# Patient Record
Sex: Female | Born: 1960
Health system: Southern US, Community
[De-identification: ages and names within clinical notes are randomized; demographics above are authoritative.]

## PROBLEM LIST (undated history)

## (undated) DIAGNOSIS — C50919 Malignant neoplasm of unspecified site of unspecified female breast: Secondary | ICD-10-CM

## (undated) DIAGNOSIS — I10 Essential (primary) hypertension: Secondary | ICD-10-CM

## (undated) DIAGNOSIS — D352 Benign neoplasm of pituitary gland: Secondary | ICD-10-CM

## (undated) DIAGNOSIS — T8859XA Other complications of anesthesia, initial encounter: Secondary | ICD-10-CM

## (undated) DIAGNOSIS — G4730 Sleep apnea, unspecified: Secondary | ICD-10-CM

## (undated) DIAGNOSIS — Z923 Personal history of irradiation: Secondary | ICD-10-CM

## (undated) DIAGNOSIS — E229 Hyperfunction of pituitary gland, unspecified: Secondary | ICD-10-CM

## (undated) DIAGNOSIS — O24419 Gestational diabetes mellitus in pregnancy, unspecified control: Secondary | ICD-10-CM

## (undated) DIAGNOSIS — T4145XA Adverse effect of unspecified anesthetic, initial encounter: Secondary | ICD-10-CM

## (undated) DIAGNOSIS — M1990 Unspecified osteoarthritis, unspecified site: Secondary | ICD-10-CM

## (undated) DIAGNOSIS — L409 Psoriasis, unspecified: Secondary | ICD-10-CM

## (undated) DIAGNOSIS — M5020 Other cervical disc displacement, unspecified cervical region: Secondary | ICD-10-CM

## (undated) HISTORY — PX: BREAST SURGERY: SHX581

## (undated) HISTORY — DX: Essential (primary) hypertension: I10

## (undated) HISTORY — PX: MINI-LAPAROTOMY W/ TUBAL LIGATION: SUR811

## (undated) HISTORY — PX: BREAST EXCISIONAL BIOPSY: SUR124

## (undated) HISTORY — PX: ABDOMINAL HYSTERECTOMY: SHX81

## (undated) HISTORY — PX: DILATION AND CURETTAGE OF UTERUS: SHX78

## (undated) HISTORY — PX: APPENDECTOMY: SHX54

## (undated) HISTORY — PX: TUBAL LIGATION: SHX77

## (undated) HISTORY — DX: Malignant neoplasm of unspecified site of unspecified female breast: C50.919

## (undated) HISTORY — PX: HERNIA REPAIR: SHX51

## (undated) HISTORY — PX: FOOT SURGERY: SHX648

## (undated) HISTORY — DX: Hyperfunction of pituitary gland, unspecified: E22.9

## (undated) HISTORY — DX: Benign neoplasm of pituitary gland: D35.2

---

## 1998-03-11 ENCOUNTER — Other Ambulatory Visit: Admission: RE | Admit: 1998-03-11 | Discharge: 1998-03-11 | Payer: Self-pay | Admitting: Obstetrics & Gynecology

## 1999-08-26 ENCOUNTER — Other Ambulatory Visit: Admission: RE | Admit: 1999-08-26 | Discharge: 1999-08-26 | Payer: Self-pay

## 2000-02-08 ENCOUNTER — Other Ambulatory Visit: Admission: RE | Admit: 2000-02-08 | Discharge: 2000-02-08 | Payer: Self-pay | Admitting: Obstetrics & Gynecology

## 2002-06-18 ENCOUNTER — Other Ambulatory Visit: Admission: RE | Admit: 2002-06-18 | Discharge: 2002-06-18 | Payer: Self-pay | Admitting: Obstetrics & Gynecology

## 2003-11-06 ENCOUNTER — Other Ambulatory Visit: Admission: RE | Admit: 2003-11-06 | Discharge: 2003-11-06 | Payer: Self-pay | Admitting: Obstetrics & Gynecology

## 2003-11-11 ENCOUNTER — Encounter: Admission: RE | Admit: 2003-11-11 | Discharge: 2003-11-11 | Payer: Self-pay | Admitting: Obstetrics & Gynecology

## 2005-03-16 ENCOUNTER — Other Ambulatory Visit: Admission: RE | Admit: 2005-03-16 | Discharge: 2005-03-16 | Payer: Self-pay | Admitting: Obstetrics & Gynecology

## 2005-03-22 ENCOUNTER — Encounter: Admission: RE | Admit: 2005-03-22 | Discharge: 2005-03-22 | Payer: Self-pay | Admitting: Obstetrics & Gynecology

## 2005-03-23 ENCOUNTER — Ambulatory Visit (HOSPITAL_COMMUNITY): Admission: RE | Admit: 2005-03-23 | Discharge: 2005-03-23 | Payer: Self-pay | Admitting: Obstetrics & Gynecology

## 2005-05-03 ENCOUNTER — Ambulatory Visit (HOSPITAL_BASED_OUTPATIENT_CLINIC_OR_DEPARTMENT_OTHER): Admission: RE | Admit: 2005-05-03 | Discharge: 2005-05-03 | Payer: Self-pay | Admitting: Surgery

## 2005-05-03 ENCOUNTER — Ambulatory Visit (HOSPITAL_COMMUNITY): Admission: RE | Admit: 2005-05-03 | Discharge: 2005-05-03 | Payer: Self-pay | Admitting: Surgery

## 2005-07-09 ENCOUNTER — Inpatient Hospital Stay (HOSPITAL_COMMUNITY): Admission: EM | Admit: 2005-07-09 | Discharge: 2005-07-13 | Payer: Self-pay | Admitting: Emergency Medicine

## 2005-07-09 ENCOUNTER — Encounter (INDEPENDENT_AMBULATORY_CARE_PROVIDER_SITE_OTHER): Payer: Self-pay | Admitting: Specialist

## 2005-07-09 ENCOUNTER — Ambulatory Visit (HOSPITAL_COMMUNITY): Admission: RE | Admit: 2005-07-09 | Discharge: 2005-07-09 | Payer: Self-pay | Admitting: Family Medicine

## 2005-07-28 ENCOUNTER — Encounter: Admission: RE | Admit: 2005-07-28 | Discharge: 2005-07-28 | Payer: Self-pay | Admitting: Surgery

## 2006-04-27 ENCOUNTER — Encounter: Admission: RE | Admit: 2006-04-27 | Discharge: 2006-04-27 | Payer: Self-pay | Admitting: Obstetrics & Gynecology

## 2007-05-23 ENCOUNTER — Encounter: Admission: RE | Admit: 2007-05-23 | Discharge: 2007-05-23 | Payer: Self-pay | Admitting: Obstetrics & Gynecology

## 2008-05-23 ENCOUNTER — Encounter: Admission: RE | Admit: 2008-05-23 | Discharge: 2008-05-23 | Payer: Self-pay | Admitting: Obstetrics & Gynecology

## 2009-09-25 ENCOUNTER — Encounter: Admission: RE | Admit: 2009-09-25 | Discharge: 2009-09-25 | Payer: Self-pay | Admitting: Family Medicine

## 2010-01-15 ENCOUNTER — Telehealth: Payer: Self-pay | Admitting: Family Medicine

## 2010-09-12 ENCOUNTER — Encounter: Payer: Self-pay | Admitting: Family Medicine

## 2010-09-13 ENCOUNTER — Encounter: Payer: Self-pay | Admitting: Obstetrics & Gynecology

## 2010-09-22 NOTE — Progress Notes (Signed)
Summary: no show  Phone Note Other Incoming   Summary of Call: no show for new patient appt  Follow-up for Phone Call        charge the NS fee Follow-up by: Nelwyn Salisbury MD,  Jan 15, 2010 10:49 AM

## 2010-09-29 ENCOUNTER — Other Ambulatory Visit: Payer: Self-pay | Admitting: Obstetrics & Gynecology

## 2010-09-29 DIAGNOSIS — Z1231 Encounter for screening mammogram for malignant neoplasm of breast: Secondary | ICD-10-CM

## 2010-10-09 ENCOUNTER — Ambulatory Visit: Payer: Self-pay

## 2010-10-21 ENCOUNTER — Ambulatory Visit
Admission: RE | Admit: 2010-10-21 | Discharge: 2010-10-21 | Disposition: A | Discharge status: Home / Self Care | Payer: BC Managed Care – PPO | Source: Ambulatory Visit | Attending: Obstetrics & Gynecology | Admitting: Obstetrics & Gynecology

## 2010-10-21 DIAGNOSIS — Z1231 Encounter for screening mammogram for malignant neoplasm of breast: Secondary | ICD-10-CM

## 2011-01-08 NOTE — Op Note (Signed)
Miranda Marquez, Miranda Marquez                  ACCOUNT NO.:  [id number]   MEDICAL RECORD NO.:  [id number]          PATIENT TYPE:  INP   LOCATION:  1608                         FACILITY:  [facility]   PHYSICIAN:  Wilmon Arms. Corliss Skains, M.D. DATE OF BIRTH:  1961/05/02   DATE OF PROCEDURE:  07/10/2005  DATE OF DISCHARGE:                                 OPERATIVE REPORT   PREOPERATIVE DIAGNOSIS:  Acute appendicitis.   POSTOPERATIVE DIAGNOSIS:  Acute appendicitis.   PROCEDURE PERFORMED:  Laparoscopic appendectomy converted to open  appendectomy.   SURGEON:  Dr. Corliss Skains   ASSISTANT:  Dr. Lindie Spruce   ANESTHESIA:  General endotracheal.   INDICATIONS:  The patient is a 50 year old female, who presented with 1 day  of periumbilical pain that has migrated to the right lower quadrant.  Her  primary care physician sent her to [facility] for a CT scan which showed  acute appendicitis.  Surgery was then consulted.  We admitted her to the  hospital for operating room for an appendectomy.   FINDINGS:  Large, inflamed, nonperforated, retrocecal appendix.   DESCRIPTION OF PROCEDURE:  The patient was brought to the operating room and  placed in supine position on the operating room table.  After an adequate  level of general endotracheal anesthesia was obtained, a Foley catheter was  placed in the sterile technique.  A small vertical incision was made just  below her umbilicus.  Dissection was carried down to the subcutaneous fat to  the fascia which was grasped with clamps and elevated.  The fascia was  opened vertically, and then the peritoneum was entered bluntly.  A 0 Vicryl  stay suture was placed in the fascia, and a blunt Hasson cannula was  inserted.  Pneumoperitoneum was obtained by insufflating CO2, maintaining a  maximum pressure of 15 mmHg.  A 5 mm port was placed in the left lower  quadrant and a 10 mm port in the right upper quadrant.  We inspected the  cecum and noted the appendix heading in a retrocecal  direction.  There was  some dense, inflammatory adhesions in this area.  These were bluntly lysed.  There were some fibrinous exudate but no evidence of perforation.  The  appendix was very large, and we were unable to adequately free this up.  Therefore, the decision was made to convert to an open appendectomy.  A  transverse incision was made directly over McBurney's point.  The patient is  obese and has a large amount of subcutaneous fat.  We had to make the  incision fairly large to allow adequate exposure of the fascia.  Balfour  retractor was used to retract the subcutaneous fat.  The fascia was opened  transversely, and the peritoneal cavity was entered.  The Balfour retractor  was switched to deeper blades and used to retract the fascia.  The inflamed  appendix was palpated and was slowly manipulated until it was brought up  into the wound.  The mesoappendix was dissected.  The appendiceal artery was  clamped, divided, and ligated with 2-0 silk ties.  Once we  were down to the  base of the appendix at the cecum, a TA30 stapler was used to staple the  base of the appendix.  The appendix was amputated and removed, sent for  pathologic examination.  The staple line was then oversewn with a Z stitch  of 2-0 silk.  This effectively buried the stump.  The right lower quadrant  was then thoroughly irrigated with saline.  No bleeding was noted.  The  irrigant was suctioned out.  The fascia was then closed with 2 layers of #1  PDS suture in a running fashion.  The subcutaneous tissues were irrigated.  Skin staples were used to close the skin.  The stay suture at the umbilical  port was used to close the fascia.  Skin staples were used in all of the  port sites.  Clean dressings were applied, and the patient was extubated and  brought to the recovery room in stable condition.  All sponge, needle, and  instrument counts were correct.      Wilmon Arms. Tsuei, M.D.  Electronically  Signed     MKT/MEDQ  D:  07/10/2005  T:  07/10/2005  Job:  [id number]   cc:   Evelena Peat, M.D.  Fax: [phone]

## 2011-01-08 NOTE — H&P (Signed)
Miranda Marquez, Miranda Marquez                  ACCOUNT NO.:  [id number]   MEDICAL RECORD NO.:  [id number]          PATIENT TYPE:  INP   LOCATION:  1608                         FACILITY:  [facility]   PHYSICIAN:  Wilmon Arms. Corliss Skains, M.D. DATE OF BIRTH:  10-04-1960   DATE OF ADMISSION:  07/09/2005  DATE OF DISCHARGE:                                HISTORY & PHYSICAL   CHIEF COMPLAINT:  Right lower quadrant pain.   HISTORY OF PRESENT ILLNESS:  Ms. Douty is a 50 year old white female in  reasonably good health, who is a patient of Dr. Caryl Never.  She was in her  usual state of health yesterday when she began having acute periumbilical  pain.  This was followed by nausea and vomiting, which persisted for several  hours.  The patient went to bed early, and when she woke up this morning,  the pain had migrated to her right lower quadrant.  She did not have any  more vomiting but had no appetite.  She denies any fever.  She did have a  normal bowel movement this morning.  She has had almost nothing to eat all  day.  Her last attempt at eating was around lunch and was just a couple of  bites.  She was seen at Dr. Lucie Leather office.  She was referred for a CT  scan, which was performed this evening.  This showed evidence of acute  appendicitis.  She also had an incidental finding of thrombosis of the right  gonadal vein.   MEDICATIONS:  Parledel   ALLERGIES:  None.   PAST MEDICAL HISTORY:  Pituitary adenoma (prolactinoma) x12 years.   PAST SURGICAL HISTORY:  Bilateral tubal ligation and excision of a breast  fibroadenoma in September 2006 by Dr. Jamey Ripa.   SOCIAL HISTORY:  Nonsmoker.  Alcohol:  Goes through a bottle of wine on  weekends.   FAMILY HISTORY:  Diabetes, melanoma, Alzheimer's disease.   PHYSICAL EXAMINATION:  GENERAL:  This is an overweight white female in no  apparent distress.  HEENT:  EOMI.  Sclerae anicteric.  NECK:  No masses.  No thyromegaly.  LUNGS:  Clear to auscultation.  Normal  respiratory effort.  HEART:  Regular rate and rhythm.  No murmurs.  ABDOMEN:  Quiet bowel sounds.  Soft.  Tender in the right lower quadrant  over McBurney's point.  Mild rebound.  No guarding.  There is positive  Rovsing sign and negative psoas sign.   No labs are available at the time of evaluation.   I reviewed the CT scan images.  This shows some inflammation in the right  lower quadrant and a large dilated appendix.  There is no evidence of free  air or free fluid.   IMPRESSION:  Acute appendicitis.   PLAN:  I recommend admission with emergent appendectomy.  We will attempt a  laparoscopic appendectomy, which should be easier due to the patient's size.  However, we discussed the possibility of need for conversion to an open  appendectomy.  We discussed all of the benefits and risks of the procedure  with the patient.  We will give preoperative  antibiotics.      Wilmon Arms. Tsuei, M.D.  Electronically Signed     MKT/MEDQ  D:  07/09/2005  T:  07/09/2005  Job:  [id number]   cc:   Evelena Peat, M.D.  Fax: [phone]

## 2011-01-08 NOTE — Discharge Summary (Signed)
NAMEJOCELINE, Miranda Marquez                  ACCOUNT NO.:  [id number]   MEDICAL RECORD NO.:  [id number]          PATIENT TYPE:  INP   LOCATION:  1608                         FACILITY:  [facility]   PHYSICIAN:  Wilmon Arms. Corliss Skains, M.D. DATE OF BIRTH:  1961-01-02   DATE OF ADMISSION:  07/09/2005  DATE OF DISCHARGE:  07/13/2005                                 DISCHARGE SUMMARY   ADMISSION DIAGNOSIS:  Acute appendicitis   DISCHARGE DIAGNOSIS:  Acute appendicitis   HISTORY OF PRESENT ILLNESS:  The patient is a 50 year old female who was  admitted via emergency department on the night of July 09, 2005. She had  been evaluated by her family practice physician for right lower quadrant  pain. He sent her here for a CT scan. The patient showed evidence for acute  appendicitis. She also had an incidental finding of a thrombosed right  gonadal vein.   On examination, the patient with significant right lower quadrant pain. The  patient admitted to the hospital and then proceeded to the operating room  for surgery. Appendectomy was initially attempted laparoscopically but due  to inflammation and retrocecal location of the appendix we had to convert to  an open procedure.   Postoperatively the patient did well. She had a fever for several days. She  was kept on intravenous antibiotics. On July 12, 2005, her white count  was repeated and was normal . Her fever has been absent for the last 24  hours. She was feeling much better. She has some skin sensitivity around the  staples. I opened the mid portion of her right lower quadrant incision, but  no purulent material was noted.   She is being discharged home on a 5-day course of Augmentin as well as  Percocet p.r.n. for pain. To follow up in one week for staple removal.      Wilmon Arms. Tsuei, M.D.  Electronically Signed     MKT/MEDQ  D:  07/13/2005  T:  07/13/2005  Job:  [id number]   cc:   Evelena Peat, M.D.  Fax: [phone]

## 2011-11-01 ENCOUNTER — Other Ambulatory Visit: Payer: Self-pay | Admitting: Obstetrics & Gynecology

## 2011-11-01 DIAGNOSIS — Z1231 Encounter for screening mammogram for malignant neoplasm of breast: Secondary | ICD-10-CM

## 2011-11-02 ENCOUNTER — Ambulatory Visit: Payer: BC Managed Care – PPO

## 2011-11-16 ENCOUNTER — Ambulatory Visit: Payer: BC Managed Care – PPO

## 2011-12-07 ENCOUNTER — Ambulatory Visit
Admission: RE | Admit: 2011-12-07 | Discharge: 2011-12-07 | Disposition: A | Discharge status: Home / Self Care | Payer: BC Managed Care – PPO | Source: Ambulatory Visit | Attending: Obstetrics & Gynecology | Admitting: Obstetrics & Gynecology

## 2011-12-07 DIAGNOSIS — Z1231 Encounter for screening mammogram for malignant neoplasm of breast: Secondary | ICD-10-CM

## 2012-11-10 ENCOUNTER — Other Ambulatory Visit: Payer: Self-pay

## 2012-11-10 DIAGNOSIS — Z1231 Encounter for screening mammogram for malignant neoplasm of breast: Secondary | ICD-10-CM

## 2012-12-11 ENCOUNTER — Ambulatory Visit: Payer: BC Managed Care – PPO

## 2013-02-28 ENCOUNTER — Ambulatory Visit
Admission: RE | Admit: 2013-02-28 | Discharge: 2013-02-28 | Disposition: A | Discharge status: Home / Self Care | Payer: BC Managed Care – PPO | Source: Ambulatory Visit

## 2013-02-28 DIAGNOSIS — Z1231 Encounter for screening mammogram for malignant neoplasm of breast: Secondary | ICD-10-CM

## 2013-05-14 ENCOUNTER — Other Ambulatory Visit: Payer: Self-pay | Admitting: Obstetrics & Gynecology

## 2014-01-28 ENCOUNTER — Other Ambulatory Visit: Payer: Self-pay

## 2014-01-28 DIAGNOSIS — Z1231 Encounter for screening mammogram for malignant neoplasm of breast: Secondary | ICD-10-CM

## 2014-03-13 ENCOUNTER — Ambulatory Visit: Payer: BC Managed Care – PPO

## 2014-03-20 ENCOUNTER — Ambulatory Visit: Payer: BC Managed Care – PPO

## 2014-04-02 ENCOUNTER — Ambulatory Visit: Payer: BC Managed Care – PPO

## 2014-09-04 ENCOUNTER — Ambulatory Visit
Admission: RE | Admit: 2014-09-04 | Discharge: 2014-09-04 | Disposition: A | Discharge status: Home / Self Care | Payer: BLUE CROSS/BLUE SHIELD | Source: Ambulatory Visit

## 2014-09-04 DIAGNOSIS — Z1231 Encounter for screening mammogram for malignant neoplasm of breast: Secondary | ICD-10-CM

## 2014-09-09 ENCOUNTER — Other Ambulatory Visit: Payer: Self-pay | Admitting: Obstetrics & Gynecology

## 2014-09-09 DIAGNOSIS — N63 Unspecified lump in unspecified breast: Secondary | ICD-10-CM

## 2014-09-17 ENCOUNTER — Other Ambulatory Visit: Payer: Self-pay | Admitting: Obstetrics & Gynecology

## 2014-09-17 ENCOUNTER — Ambulatory Visit
Admission: RE | Admit: 2014-09-17 | Discharge: 2014-09-17 | Disposition: A | Discharge status: Home / Self Care | Payer: BLUE CROSS/BLUE SHIELD | Source: Ambulatory Visit | Attending: Obstetrics & Gynecology | Admitting: Obstetrics & Gynecology

## 2014-09-17 DIAGNOSIS — N63 Unspecified lump in unspecified breast: Secondary | ICD-10-CM

## 2014-09-20 ENCOUNTER — Other Ambulatory Visit: Payer: Self-pay | Admitting: Obstetrics & Gynecology

## 2014-09-20 DIAGNOSIS — N63 Unspecified lump in unspecified breast: Secondary | ICD-10-CM

## 2014-09-27 ENCOUNTER — Inpatient Hospital Stay: Admission: RE | Admit: 2014-09-27 | Payer: BLUE CROSS/BLUE SHIELD | Source: Ambulatory Visit

## 2014-10-08 ENCOUNTER — Other Ambulatory Visit: Payer: Self-pay | Admitting: Obstetrics & Gynecology

## 2014-10-10 ENCOUNTER — Ambulatory Visit
Admission: RE | Admit: 2014-10-10 | Discharge: 2014-10-10 | Disposition: A | Discharge status: Home / Self Care | Payer: BLUE CROSS/BLUE SHIELD | Source: Ambulatory Visit | Attending: Obstetrics & Gynecology | Admitting: Obstetrics & Gynecology

## 2014-10-10 DIAGNOSIS — N63 Unspecified lump in unspecified breast: Secondary | ICD-10-CM

## 2014-10-10 LAB — CYTOLOGY - PAP

## 2014-10-11 HISTORY — PX: BREAST BIOPSY: SHX20

## 2014-10-15 ENCOUNTER — Other Ambulatory Visit: Payer: Self-pay | Admitting: Obstetrics & Gynecology

## 2014-10-24 ENCOUNTER — Other Ambulatory Visit (INDEPENDENT_AMBULATORY_CARE_PROVIDER_SITE_OTHER): Payer: Self-pay | Admitting: Surgery

## 2014-10-28 ENCOUNTER — Encounter: Payer: Self-pay | Admitting: Gynecologic Oncology

## 2014-10-28 ENCOUNTER — Ambulatory Visit: Payer: BLUE CROSS/BLUE SHIELD | Attending: Gynecologic Oncology | Admitting: Gynecologic Oncology

## 2014-10-28 VITALS — BP 125/81 | HR 79 | Temp 98.3°F | Resp 18 | Ht 69.0 in | Wt 281.3 lb

## 2014-10-28 DIAGNOSIS — I10 Essential (primary) hypertension: Secondary | ICD-10-CM | POA: Insufficient documentation

## 2014-10-28 DIAGNOSIS — R599 Enlarged lymph nodes, unspecified: Secondary | ICD-10-CM | POA: Diagnosis not present

## 2014-10-28 DIAGNOSIS — N840 Polyp of corpus uteri: Secondary | ICD-10-CM | POA: Insufficient documentation

## 2014-10-28 DIAGNOSIS — D352 Benign neoplasm of pituitary gland: Secondary | ICD-10-CM | POA: Insufficient documentation

## 2014-10-28 DIAGNOSIS — Z6841 Body Mass Index (BMI) 40.0 and over, adult: Secondary | ICD-10-CM | POA: Insufficient documentation

## 2014-10-28 DIAGNOSIS — Z87891 Personal history of nicotine dependence: Secondary | ICD-10-CM | POA: Diagnosis not present

## 2014-10-28 DIAGNOSIS — N8500 Endometrial hyperplasia, unspecified: Secondary | ICD-10-CM | POA: Insufficient documentation

## 2014-10-28 DIAGNOSIS — E229 Hyperfunction of pituitary gland, unspecified: Secondary | ICD-10-CM

## 2014-10-28 DIAGNOSIS — N8502 Endometrial intraepithelial neoplasia [EIN]: Secondary | ICD-10-CM | POA: Insufficient documentation

## 2014-10-28 NOTE — Progress Notes (Signed)
Consult Note: Gyn-Onc  Consult was requested by Dr. Stann Mainland for the evaluation of Miranda Marquez 54 y.o. female with endometrial hyperplasia.  CC:  Chief Complaint  Patient presents with  . endometrial hyperplasia    Assessment/Plan:  Miranda Marquez  is a 54 y.o.  year old with at least complex hyperplasia with atypia on endometrial biopsy. The patient is morbidly obese. We are recommending definitive diagnostic and therapeutic intervention with robotic assisted total hysterectomy bilateral salpingo-oophorectomy and frozen section. Frozen section reveals invasive adenocarcinoma with criteria concerning for the potential for metastatic disease (tumor greater than 2 cm, outer half myometrial invasion or grade 3 tumor) we will proceed with staging including lymphadenectomy.  I discussed with Miranda. Marquez surgical risks including  bleeding, infection, damage to internal organs (such as bladder,ureters, bowels), blood clot, reoperation, nerve/positioning injury, conversion to laparotomy and rehospitalization. I discussed that these risks are elevated above baseline for her secondary to morbid obesity.  HPI: Miranda Marquez is A 54 year old gravida 46 para 24 who is seen in consultation at the request of Dr.Wein for at least complex endometrial hyperplasia with atypia on endometrial biopsy and resection of cervical polyp. The patient began experiencing unusual symptoms in February 2015 which included pain on the right abdomen and flank, generalized rash on extensor surfaces of her upper and lower extremities and constipation. She saw her at Dr. Stann Mainland who performed a pelvic examination and identified a cervical polyp which was removed. Pathology from this from 10/08/2014 reveals a uterine polyp with glandular hyperplasia and extensive tubal metaplasia. She return on 10/15/2014 for more comprehensive endometrial assessment with an endometrial biopsy and this revealed at least complex endometrial hyperplasia with atypia.  She denies postmenopausal bleeding.   Current Meds:  Outpatient Encounter Prescriptions as of 10/28/2014  Medication Sig  . bromocriptine (PARLODEL) 2.5 MG tablet Take 5 mg by mouth daily.  . clobetasol (TEMOVATE) 0.05 % external solution   . clobetasol cream (TEMOVATE) 0.05 %   . cyproheptadine (PERIACTIN) 4 MG tablet   . ketoconazole (NIZORAL) 2 % shampoo   . lisinopril-hydrochlorothiazide (PRINZIDE,ZESTORETIC) 10-12.5 MG per tablet Take 1 tablet by mouth every morning.  . [DISCONTINUED] ketoconazole (NIZORAL) 200 MG tablet Take 200 mg by mouth daily.    Allergy: No Known Allergies  Social Hx:   History   Social History  . Marital Status: Married    Spouse Name: N/A  . Number of Children: N/A  . Years of Education: N/A   Occupational History  . Not on file.   Social History Main Topics  . Smoking status: Former Research scientist (life sciences)  . Smokeless tobacco: Not on file  . Alcohol Use: 1.2 oz/week    2 Glasses of wine per week  . Drug Use: No  . Sexual Activity: Yes   Other Topics Concern  . Not on file   Social History Narrative  . No narrative on file    Past Surgical Hx:  Past Surgical History  Procedure Laterality Date  . Appendectomy    . Mini-laparotomy w/ tubal ligation      Past Medical Hx:  Past Medical History  Diagnosis Date  . Pituitary microadenoma with hyperprolactinemia   . Hypertension     Past Gynecological History:  SVD x 4 (including 10lb baby). No postmenopausal bleeding. S/p tubal ligation.  No LMP recorded. Patient is postmenopausal.  Family Hx:  Family History  Problem Relation Age of Onset  . Hypertension Father     Review of Systems:  Constitutional  Feels well,    ENT Normal appearing ears and nares bilaterally Skin/Breast  No rash, sores, jaundice, itching, dryness Cardiovascular  No chest pain, shortness of breath, or edema  Pulmonary  No cough or wheeze.  Gastro Intestinal  No nausea, vomitting, or diarrhoea. No bright red blood  per rectum, no abdominal pain, change in bowel movement, or constipation.  Genito Urinary  No frequency, urgency, dysuria,  Musculo Skeletal  No myalgia, arthralgia, joint swelling or pain  Neurologic  No weakness, numbness, change in gait,  Psychology  No depression, anxiety, insomnia.   Vitals:  Blood pressure 125/81, pulse 79, temperature 98.3 F (36.8 C), temperature source Oral, resp. rate 18, height 5\' 9"  (1.753 m), weight 281 lb 4.8 oz (127.597 kg).  Physical Exam: WD in NAD Neck  Supple NROM, without any enlargements.  Lymph Node Survey No cervical supraclavicular or inguinal adenopathy Cardiovascular  Pulse normal rate, regularity and rhythm. S1 and S2 normal.  Lungs  Clear to auscultation bilateraly, without wheezes/crackles/rhonchi. Good air movement.  Skin  No rash/lesions/breakdown  Psychiatry  Alert and oriented to person, place, and time  Abdomen  Normoactive bowel sounds, abdomen soft, non-tender and obese without evidence of hernia. Transverse incision for appendectomy Back No CVA tenderness Genito Urinary  Vulva/vagina: Normal external female genitalia.  No lesions. No discharge or bleeding.  Bladder/urethra:  No lesions or masses, well supported bladder  Vagina: prolapse  Cervix: Normal appearing, no lesions.  Uterus: Small, mobile, no parametrial involvement or nodularity.  Adnexa: no palpable masses. Rectal  Good tone, no masses no cul de sac nodularity.  Extremities  No bilateral cyanosis, clubbing or edema.   Miranda Eva, MD   10/28/2014, 12:33 PM

## 2014-10-28 NOTE — Patient Instructions (Signed)
Preparing for your Surgery  Plan for surgery on March 24 with Dr. Denman George.  Pre-operative Testing -You will receive a phone call from presurgical testing at [facility] to arrange for a pre-operative testing appointment before your surgery.  This appointment normally occurs one to two weeks before your scheduled surgery.   -Bring your insurance card, copy of an advanced directive if applicable, medication list  -At that visit, you will be asked to sign a consent for a possible blood transfusion in case a transfusion becomes necessary during surgery.  The need for a blood transfusion is rare but having consent is a necessary part of your care.     -You should not be taking blood thinners or aspirin at least ten days prior to surgery unless instructed by your surgeon.  Day Before Surgery at [facility] will be asked to take in only clear liquids the day before surgery.  Examples of clear liquids include broths, jello, and clear juices.  You will be advised to have nothing to eat or drink after midnight the evening before.    Your role in recovery Your role is to become active as soon as directed by your doctor, while still giving yourself time to heal.  Rest when you feel tired. You will be asked to do the following in order to speed your recovery:  - Cough and breathe deeply. This helps toclear and expand your lungs and can prevent pneumonia. You may be given a spirometer to practice deep breathing. A staff member will show you how to use the spirometer. - Do mild physical activity. Walking or moving your legs help your circulation and body functions return to normal. A staff member will help you when you try to walk and will provide you with simple exercises. Do not try to get up or walk alone the first time. - Actively manage your pain. Managing your pain lets you move in comfort. We will ask you to rate your pain on a scale of zero to 10. It is your responsibility to tell  your doctor or nurse where and how much you hurt so your pain can be treated.  Special Considerations -If you are diabetic, you may be placed on insulin after surgery to have closer control over your blood sugars to promote healing and recovery.  This does not mean that you will be discharged on insulin.  If applicable, your oral antidiabetics will be resumed when you are tolerating a solid diet.  -Your final pathology results from surgery should be available by the Friday after surgery and the results will be relayed to you when available.   Blood Transfusion Information WHAT IS A BLOOD TRANSFUSION? A transfusion is the replacement of blood or some of its parts. Blood is made up of multiple cells which provide different functions.  Red blood cells carry oxygen and are used for blood loss replacement.  White blood cells fight against infection.  Platelets control bleeding.  Plasma helps clot blood.  Other blood products are available for specialized needs, such as hemophilia or other clotting disorders. BEFORE THE TRANSFUSION  Who gives blood for transfusions?   You may be able to donate blood to be used at a later date on yourself (autologous donation).  Relatives can be asked to donate blood. This is generally not any safer than if you have received blood from a stranger. The same precautions are taken to ensure safety when a relative's blood is donated.  Healthy volunteers who are  fully evaluated to make sure their blood is safe. This is blood bank blood. Transfusion therapy is the safest it has ever been in the practice of medicine. Before blood is taken from a donor, a complete history is taken to make sure that person has no history of diseases nor engages in risky social behavior (examples are intravenous drug use or sexual activity with multiple partners). The donor's travel history is screened to minimize risk of transmitting infections, such as malaria. The donated blood is  tested for signs of infectious diseases, such as HIV and hepatitis. The blood is then tested to be sure it is compatible with you in order to minimize the chance of a transfusion reaction. If you or a relative donates blood, this is often done in anticipation of surgery and is not appropriate for emergency situations. It takes many days to process the donated blood. RISKS AND COMPLICATIONS Although transfusion therapy is very safe and saves many lives, the main dangers of transfusion include:   Getting an infectious disease.  Developing a transfusion reaction. This is an allergic reaction to something in the blood you were given. Every precaution is taken to prevent this. The decision to have a blood transfusion has been considered carefully by your caregiver before blood is given. Blood is not given unless the benefits outweigh the risks.

## 2014-11-25 ENCOUNTER — Other Ambulatory Visit (HOSPITAL_COMMUNITY): Payer: Self-pay | Admitting: *Deleted

## 2014-11-25 NOTE — Patient Instructions (Addendum)
Miranda Marquez  11/25/2014   Your procedure is scheduled on: 12-03-14 tuesday  Report to [facility] Main  Entrance and follow signs to               [place] at 530 AM.  Call this number if you have problems the morning of surgery [phone]   Remember:  Do not eat food  :After Midnight Sunday night, clear liquids all day Monday 4011-16, no clear liquids after midnight Monday night.     Take these medicines the morning of surgery with A SIP OF WATER: PARLODEL                                You may not have any metal on your body including hair pins and              piercings  Do not wear jewelry, make-up, lotions, powders or perfumes.             Do not wear nail polish.  Do not shave  48 hours prior to surgery.              Men may shave face and neck.   Do not bring valuables to the hospital. [place].  Contacts, dentures or bridgework may not be worn into surgery.  Leave suitcase in the car. After surgery it may be brought to your room.     Patients discharged the day of surgery will not be allowed to drive home.  Name and phone number of your driver:  Special Instructions: N/A              Please read over the following fact sheets you were given: _____________________________________________________________________                CLEAR LIQUID DIET   Foods Allowed                                                                     Foods Excluded  Coffee and tea, regular and decaf                             liquids that you cannot  Plain Jell-O in any flavor                                             see through such as: Fruit ices (not with fruit pulp)                                     milk, soups, orange juice  Iced Popsicles  All solid food Carbonated beverages, regular and diet                                    Cranberry, grape and apple  juices Sports drinks like Gatorade Lightly seasoned clear broth or consume(fat free) Sugar, honey syrup  Sample Menu Breakfast                                Lunch                                     Supper Cranberry juice                    Beef broth                            Chicken broth Jell-O                                     Grape juice                           Apple juice Coffee or tea                        Jell-O                                      Popsicle                                                Coffee or tea                        Coffee or tea  _____________________________________________________________________  [facility] - Preparing for Surgery Before surgery, you can play an important role.  Because skin is not sterile, your skin needs to be as free of germs as possible.  You can reduce the number of germs on your skin by washing with CHG (chlorahexidine gluconate) soap before surgery.  CHG is an antiseptic cleaner which kills germs and bonds with the skin to continue killing germs even after washing. Please DO NOT use if you have an allergy to CHG or antibacterial soaps.  If your skin becomes reddened/irritated stop using the CHG and inform your nurse when you arrive at Short Stay. Do not shave (including legs and underarms) for at least 48 hours prior to the first CHG shower.  You may shave your face/neck. Please follow these instructions carefully:  1.  Shower with CHG Soap the night before surgery and the  morning of Surgery.  2.  If you choose to wash your hair, wash your hair first as usual with your  normal  shampoo.  3.  After you shampoo, rinse your hair and body thoroughly to remove the  shampoo.  4.  Use CHG as you would any other liquid soap.  You can apply chg directly  to the skin and wash                       Gently with a scrungie or clean washcloth.  5.  Apply the CHG Soap to your body ONLY FROM THE NECK DOWN.   Do not  use on face/ open                           Wound or open sores. Avoid contact with eyes, ears mouth and genitals (private parts).                       Wash face,  Genitals (private parts) with your normal soap.             6.  Wash thoroughly, paying special attention to the area where your surgery  will be performed.  7.  Thoroughly rinse your body with warm water from the neck down.  8.  DO NOT shower/wash with your normal soap after using and rinsing off  the CHG Soap.                9.  Pat yourself dry with a clean towel.            10.  Wear clean pajamas.            11.  Place clean sheets on your bed the night of your first shower and do not  sleep with pets. Day of Surgery : Do not apply any lotions/deodorants the morning of surgery.  Please wear clean clothes to the hospital/surgery center.  FAILURE TO FOLLOW THESE INSTRUCTIONS MAY RESULT IN THE CANCELLATION OF YOUR SURGERY PATIENT SIGNATURE_________________________________  NURSE SIGNATURE__________________________________  ________________________________________________________________________

## 2014-11-26 ENCOUNTER — Encounter (HOSPITAL_COMMUNITY): Payer: Self-pay

## 2014-11-26 ENCOUNTER — Encounter (HOSPITAL_COMMUNITY)
Admission: RE | Admit: 2014-11-26 | Discharge: 2014-11-26 | Disposition: A | Discharge status: Home / Self Care | Payer: BLUE CROSS/BLUE SHIELD | Source: Ambulatory Visit | Attending: Gynecologic Oncology | Admitting: Gynecologic Oncology

## 2014-11-26 DIAGNOSIS — Z79899 Other long term (current) drug therapy: Secondary | ICD-10-CM | POA: Diagnosis not present

## 2014-11-26 DIAGNOSIS — Z01810 Encounter for preprocedural cardiovascular examination: Secondary | ICD-10-CM | POA: Diagnosis not present

## 2014-11-26 DIAGNOSIS — E221 Hyperprolactinemia: Secondary | ICD-10-CM | POA: Diagnosis not present

## 2014-11-26 DIAGNOSIS — N8502 Endometrial intraepithelial neoplasia [EIN]: Secondary | ICD-10-CM | POA: Insufficient documentation

## 2014-11-26 DIAGNOSIS — D352 Benign neoplasm of pituitary gland: Secondary | ICD-10-CM | POA: Diagnosis not present

## 2014-11-26 DIAGNOSIS — I10 Essential (primary) hypertension: Secondary | ICD-10-CM | POA: Diagnosis not present

## 2014-11-26 DIAGNOSIS — Z01812 Encounter for preprocedural laboratory examination: Secondary | ICD-10-CM | POA: Diagnosis present

## 2014-11-26 DIAGNOSIS — N838 Other noninflammatory disorders of ovary, fallopian tube and broad ligament: Secondary | ICD-10-CM | POA: Diagnosis not present

## 2014-11-26 DIAGNOSIS — N8501 Benign endometrial hyperplasia: Secondary | ICD-10-CM | POA: Diagnosis not present

## 2014-11-26 DIAGNOSIS — Z6841 Body Mass Index (BMI) 40.0 and over, adult: Secondary | ICD-10-CM | POA: Diagnosis not present

## 2014-11-26 DIAGNOSIS — Z7952 Long term (current) use of systemic steroids: Secondary | ICD-10-CM | POA: Diagnosis not present

## 2014-11-26 DIAGNOSIS — Z87891 Personal history of nicotine dependence: Secondary | ICD-10-CM | POA: Diagnosis not present

## 2014-11-26 HISTORY — DX: Psoriasis, unspecified: L40.9

## 2014-11-26 HISTORY — DX: Unspecified osteoarthritis, unspecified site: M19.90

## 2014-11-26 LAB — URINALYSIS, ROUTINE W REFLEX MICROSCOPIC
BILIRUBIN URINE: NEGATIVE
Glucose, UA: NEGATIVE mg/dL
Hgb urine dipstick: NEGATIVE
Ketones, ur: NEGATIVE mg/dL
LEUKOCYTES UA: NEGATIVE
NITRITE: NEGATIVE
Protein, ur: NEGATIVE mg/dL
SPECIFIC GRAVITY, URINE: 1.019 (ref 1.005–1.030)
UROBILINOGEN UA: 0.2 mg/dL (ref 0.0–1.0)
pH: 6 (ref 5.0–8.0)

## 2014-11-26 LAB — CBC WITH DIFFERENTIAL/PLATELET
BASOS ABS: 0 10*3/uL (ref 0.0–0.1)
BASOS PCT: 1 % (ref 0–1)
EOS PCT: 3 % (ref 0–5)
Eosinophils Absolute: 0.1 10*3/uL (ref 0.0–0.7)
HCT: 41.8 % (ref 36.0–46.0)
Hemoglobin: 14 g/dL (ref 12.0–15.0)
Lymphocytes Relative: 37 % (ref 12–46)
Lymphs Abs: 1.7 10*3/uL (ref 0.7–4.0)
MCH: 30.1 pg (ref 26.0–34.0)
MCHC: 33.5 g/dL (ref 30.0–36.0)
MCV: 89.9 fL (ref 78.0–100.0)
Monocytes Absolute: 0.5 10*3/uL (ref 0.1–1.0)
Monocytes Relative: 10 % (ref 3–12)
NEUTROS ABS: 2.3 10*3/uL (ref 1.7–7.7)
Neutrophils Relative %: 49 % (ref 43–77)
PLATELETS: 241 10*3/uL (ref 150–400)
RBC: 4.65 MIL/uL (ref 3.87–5.11)
RDW: 13.6 % (ref 11.5–15.5)
WBC: 4.6 10*3/uL (ref 4.0–10.5)

## 2014-11-26 LAB — COMPREHENSIVE METABOLIC PANEL
ALBUMIN: 4.4 g/dL (ref 3.5–5.2)
ALT: 18 U/L (ref 0–35)
AST: 17 U/L (ref 0–37)
Alkaline Phosphatase: 66 U/L (ref 39–117)
Anion gap: 11 (ref 5–15)
BUN: 12 mg/dL (ref 6–23)
CO2: 26 mmol/L (ref 19–32)
CREATININE: 0.76 mg/dL (ref 0.50–1.10)
Calcium: 9.4 mg/dL (ref 8.4–10.5)
Chloride: 105 mmol/L (ref 96–112)
GFR calc Af Amer: >90 mL/min (ref >90)
Glucose, Bld: 98 mg/dL (ref 70–99)
POTASSIUM: 4.2 mmol/L (ref 3.5–5.1)
Sodium: 142 mmol/L (ref 135–145)
Total Bilirubin: 0.8 mg/dL (ref 0.3–1.2)
Total Protein: 7 g/dL (ref 6.0–8.3)

## 2014-11-26 NOTE — Progress Notes (Signed)
   11/26/14 1454  OBSTRUCTIVE SLEEP APNEA  Have you ever been diagnosed with sleep apnea through a sleep study? No  Do you snore loudly (loud enough to be heard through closed doors)?  1  Do you often feel tired, fatigued, or sleepy during the daytime? 1  Has anyone observed you stop breathing during your sleep? 0  Do you have, or are you being treated for high blood pressure? 1  BMI more than 35 kg/m2? 1  Age over 55 years old? 1  Neck circumference greater than 40 cm/16 inches? 0  Gender: 0  Obstructive Sleep Apnea Score 5

## 2014-12-02 MED ORDER — DEXTROSE 5 % IV SOLN
3.0000 g | INTRAVENOUS | Status: AC
  Administered 2014-12-03: 3 g via INTRAVENOUS
  Filled 2014-12-02: qty 3000

## 2014-12-02 NOTE — Anesthesia Preprocedure Evaluation (Addendum)
Anesthesia Evaluation  Patient identified by MRN, date of birth, ID band Patient awake    Reviewed: Allergy & Precautions, H&P , NPO status , Patient's Chart, lab work & pertinent test results  Airway Mallampati: II  TM Distance: >3 FB Neck ROM: full    Dental no notable dental hx. (+) Teeth Intact, Dental Advisory Given   Pulmonary neg pulmonary ROS, former smoker,  breath sounds clear to auscultation  Pulmonary exam normal       Cardiovascular Exercise Tolerance: Good hypertension, Pt. on medications negative cardio ROS  Rhythm:regular Rate:Normal  RBBB   Neuro/Psych negative neurological ROS  negative psych ROS   GI/Hepatic negative GI ROS, Neg liver ROS,   Endo/Other  negative endocrine ROSMorbid obesityPituitary microadenoma with hyperprolactinemia  Renal/GU negative Renal ROS  negative genitourinary   Musculoskeletal   Abdominal (+) + obese,   Peds  Hematology negative hematology ROS (+)   Anesthesia Other Findings   Reproductive/Obstetrics negative OB ROS                            Anesthesia Physical Anesthesia Plan  ASA: III  Anesthesia Plan: General   Post-op Pain Management:    Induction: Intravenous  Airway Management Planned: Oral ETT  Additional Equipment:   Intra-op Plan:   Post-operative Plan: Extubation in OR  Informed Consent: I have reviewed the patients History and Physical, chart, labs and discussed the procedure including the risks, benefits and alternatives for the proposed anesthesia with the patient or authorized representative who has indicated his/her understanding and acceptance.   Dental Advisory Given  Plan Discussed with: CRNA and Surgeon  Anesthesia Plan Comments:         Anesthesia Quick Evaluation

## 2014-12-03 ENCOUNTER — Ambulatory Visit (HOSPITAL_COMMUNITY)
Admission: RE | Admit: 2014-12-03 | Discharge: 2014-12-04 | Disposition: A | Discharge status: Home / Self Care | Payer: BLUE CROSS/BLUE SHIELD | Source: Ambulatory Visit | Attending: Gynecologic Oncology | Admitting: Gynecologic Oncology

## 2014-12-03 ENCOUNTER — Ambulatory Visit (HOSPITAL_COMMUNITY): Payer: BLUE CROSS/BLUE SHIELD | Admitting: Anesthesiology

## 2014-12-03 ENCOUNTER — Encounter (HOSPITAL_COMMUNITY)
Admission: RE | Disposition: A | Discharge status: Home / Self Care | Payer: Self-pay | Source: Ambulatory Visit | Attending: Gynecologic Oncology

## 2014-12-03 ENCOUNTER — Encounter (HOSPITAL_COMMUNITY): Payer: Self-pay | Admitting: *Deleted

## 2014-12-03 DIAGNOSIS — N8502 Endometrial intraepithelial neoplasia [EIN]: Secondary | ICD-10-CM | POA: Diagnosis present

## 2014-12-03 DIAGNOSIS — N8500 Endometrial hyperplasia, unspecified: Secondary | ICD-10-CM | POA: Diagnosis present

## 2014-12-03 DIAGNOSIS — N8501 Benign endometrial hyperplasia: Secondary | ICD-10-CM | POA: Diagnosis not present

## 2014-12-03 DIAGNOSIS — D352 Benign neoplasm of pituitary gland: Secondary | ICD-10-CM | POA: Insufficient documentation

## 2014-12-03 DIAGNOSIS — Z7952 Long term (current) use of systemic steroids: Secondary | ICD-10-CM | POA: Insufficient documentation

## 2014-12-03 DIAGNOSIS — I10 Essential (primary) hypertension: Secondary | ICD-10-CM | POA: Insufficient documentation

## 2014-12-03 DIAGNOSIS — Z87891 Personal history of nicotine dependence: Secondary | ICD-10-CM | POA: Insufficient documentation

## 2014-12-03 DIAGNOSIS — N838 Other noninflammatory disorders of ovary, fallopian tube and broad ligament: Secondary | ICD-10-CM | POA: Insufficient documentation

## 2014-12-03 DIAGNOSIS — Z6841 Body Mass Index (BMI) 40.0 and over, adult: Secondary | ICD-10-CM | POA: Insufficient documentation

## 2014-12-03 DIAGNOSIS — J029 Acute pharyngitis, unspecified: Secondary | ICD-10-CM

## 2014-12-03 DIAGNOSIS — Z79899 Other long term (current) drug therapy: Secondary | ICD-10-CM | POA: Insufficient documentation

## 2014-12-03 DIAGNOSIS — E221 Hyperprolactinemia: Secondary | ICD-10-CM | POA: Insufficient documentation

## 2014-12-03 HISTORY — PX: ROBOTIC ASSISTED TOTAL HYSTERECTOMY WITH BILATERAL SALPINGO OOPHERECTOMY: SHX6086

## 2014-12-03 LAB — ABO/RH: ABO/RH(D): O POS

## 2014-12-03 LAB — TYPE AND SCREEN
ABO/RH(D): O POS
Antibody Screen: NEGATIVE

## 2014-12-03 SURGERY — ROBOTIC ASSISTED TOTAL HYSTERECTOMY WITH BILATERAL SALPINGO OOPHORECTOMY
Anesthesia: General | Laterality: Bilateral

## 2014-12-03 MED ORDER — ONDANSETRON HCL 4 MG PO TABS
4.0000 mg | ORAL_TABLET | Freq: Four times a day (QID) | ORAL | Status: DC | PRN
  Filled 2014-12-03: qty 1

## 2014-12-03 MED ORDER — DEXAMETHASONE SODIUM PHOSPHATE 10 MG/ML IJ SOLN
INTRAMUSCULAR | Status: DC | PRN
  Administered 2014-12-03: 10 mg via INTRAVENOUS

## 2014-12-03 MED ORDER — PROPOFOL 10 MG/ML IV BOLUS
INTRAVENOUS | Status: AC
  Filled 2014-12-03: qty 20

## 2014-12-03 MED ORDER — ONDANSETRON HCL 4 MG/2ML IJ SOLN
INTRAMUSCULAR | Status: DC | PRN
  Administered 2014-12-03: 4 mg via INTRAVENOUS

## 2014-12-03 MED ORDER — OXYCODONE-ACETAMINOPHEN 5-325 MG PO TABS
1.0000 | ORAL_TABLET | ORAL | Status: DC | PRN
  Administered 2014-12-03: 1 via ORAL
  Filled 2014-12-03: qty 1

## 2014-12-03 MED ORDER — GLYCOPYRROLATE 0.2 MG/ML IJ SOLN
INTRAMUSCULAR | Status: DC | PRN
  Administered 2014-12-03: 0.2 mg via INTRAVENOUS
  Administered 2014-12-03: 0.6 mg via INTRAVENOUS

## 2014-12-03 MED ORDER — GLYCOPYRROLATE 0.2 MG/ML IJ SOLN
INTRAMUSCULAR | Status: AC
  Filled 2014-12-03: qty 1

## 2014-12-03 MED ORDER — ONDANSETRON HCL 4 MG/2ML IJ SOLN: 4.0000 mg | Freq: Four times a day (QID) | INTRAMUSCULAR | Status: DC | PRN

## 2014-12-03 MED ORDER — HYDROMORPHONE HCL 1 MG/ML IJ SOLN: 0.2000 mg | INTRAMUSCULAR | Status: DC | PRN

## 2014-12-03 MED ORDER — SUCCINYLCHOLINE CHLORIDE 20 MG/ML IJ SOLN
INTRAMUSCULAR | Status: DC | PRN
  Administered 2014-12-03: 100 mg via INTRAVENOUS

## 2014-12-03 MED ORDER — NEOSTIGMINE METHYLSULFATE 10 MG/10ML IV SOLN
INTRAVENOUS | Status: AC
  Filled 2014-12-03: qty 1

## 2014-12-03 MED ORDER — MIDAZOLAM HCL 2 MG/2ML IJ SOLN
INTRAMUSCULAR | Status: AC
  Filled 2014-12-03: qty 2

## 2014-12-03 MED ORDER — GLYCOPYRROLATE 0.2 MG/ML IJ SOLN
0.2000 mg | Freq: Once | INTRAMUSCULAR | Status: AC
  Administered 2014-12-03: 0.2 mg via INTRAVENOUS

## 2014-12-03 MED ORDER — ACETAMINOPHEN 10 MG/ML IV SOLN
INTRAVENOUS | Status: DC | PRN
  Administered 2014-12-03: 1000 mg via INTRAVENOUS

## 2014-12-03 MED ORDER — LACTATED RINGERS IV SOLN
INTRAVENOUS | Status: DC | PRN
  Administered 2014-12-03 (×2): via INTRAVENOUS

## 2014-12-03 MED ORDER — HYDROMORPHONE HCL 2 MG/ML IJ SOLN
INTRAMUSCULAR | Status: AC
  Filled 2014-12-03: qty 1

## 2014-12-03 MED ORDER — LACTATED RINGERS IV SOLN
INTRAVENOUS | Status: DC
  Administered 2014-12-03: 1000 mL via INTRAVENOUS
  Administered 2014-12-03: 13:00:00 via INTRAVENOUS

## 2014-12-03 MED ORDER — ACETAMINOPHEN 10 MG/ML IV SOLN
1000.0000 mg | Freq: Once | INTRAVENOUS | Status: DC
  Filled 2014-12-03: qty 100

## 2014-12-03 MED ORDER — DEXAMETHASONE SODIUM PHOSPHATE 10 MG/ML IJ SOLN
INTRAMUSCULAR | Status: AC
  Filled 2014-12-03: qty 1

## 2014-12-03 MED ORDER — SUFENTANIL CITRATE 50 MCG/ML IV SOLN
INTRAVENOUS | Status: DC | PRN
  Administered 2014-12-03: 5 ug via INTRAVENOUS
  Administered 2014-12-03: 10 ug via INTRAVENOUS
  Administered 2014-12-03: 5 ug via INTRAVENOUS
  Administered 2014-12-03: 10 ug via INTRAVENOUS
  Administered 2014-12-03: 5 ug via INTRAVENOUS

## 2014-12-03 MED ORDER — MIDAZOLAM HCL 5 MG/5ML IJ SOLN
INTRAMUSCULAR | Status: DC | PRN
  Administered 2014-12-03: 2 mg via INTRAVENOUS

## 2014-12-03 MED ORDER — LIDOCAINE HCL (CARDIAC) 20 MG/ML IV SOLN
INTRAVENOUS | Status: DC | PRN
  Administered 2014-12-03: 25 mg via INTRATRACHEAL
  Administered 2014-12-03: 75 mg via INTRAVENOUS

## 2014-12-03 MED ORDER — LISINOPRIL 10 MG PO TABS
10.0000 mg | ORAL_TABLET | Freq: Every day | ORAL | Status: DC
  Filled 2014-12-03 (×2): qty 1

## 2014-12-03 MED ORDER — KCL IN DEXTROSE-NACL 20-5-0.45 MEQ/L-%-% IV SOLN
INTRAVENOUS | Status: DC
  Administered 2014-12-03: 16:00:00 via INTRAVENOUS
  Filled 2014-12-03 (×2): qty 1000

## 2014-12-03 MED ORDER — STERILE WATER FOR IRRIGATION IR SOLN
Status: DC | PRN
  Administered 2014-12-03: 3000 mL

## 2014-12-03 MED ORDER — IBUPROFEN 800 MG PO TABS
800.0000 mg | ORAL_TABLET | Freq: Three times a day (TID) | ORAL | Status: DC | PRN
  Filled 2014-12-03: qty 1

## 2014-12-03 MED ORDER — LACTATED RINGERS IV SOLN
Freq: Once | INTRAVENOUS | Status: AC
  Administered 2014-12-03: 12:00:00 via INTRAVENOUS

## 2014-12-03 MED ORDER — LACTATED RINGERS IV SOLN
Freq: Once | INTRAVENOUS | Status: AC
  Administered 2014-12-03: 11:00:00 via INTRAVENOUS

## 2014-12-03 MED ORDER — LIDOCAINE HCL (CARDIAC) 20 MG/ML IV SOLN
INTRAVENOUS | Status: AC
  Filled 2014-12-03: qty 5

## 2014-12-03 MED ORDER — HYDROMORPHONE HCL 1 MG/ML IJ SOLN
0.2500 mg | INTRAMUSCULAR | Status: DC | PRN
  Administered 2014-12-03 (×2): 1 mg via INTRAVENOUS

## 2014-12-03 MED ORDER — LISINOPRIL-HYDROCHLOROTHIAZIDE 10-12.5 MG PO TABS: 1.0000 | ORAL_TABLET | Freq: Every morning | ORAL | Status: DC

## 2014-12-03 MED ORDER — HYDROCHLOROTHIAZIDE 12.5 MG PO CAPS
12.5000 mg | ORAL_CAPSULE | Freq: Every day | ORAL | Status: DC
  Filled 2014-12-03 (×2): qty 1

## 2014-12-03 MED ORDER — LACTATED RINGERS IR SOLN
Status: DC | PRN
  Administered 2014-12-03: 1000 mL

## 2014-12-03 MED ORDER — ROCURONIUM BROMIDE 100 MG/10ML IV SOLN
INTRAVENOUS | Status: AC
  Filled 2014-12-03: qty 1

## 2014-12-03 MED ORDER — SODIUM CHLORIDE 3 % IN NEBU
INHALATION_SOLUTION | RESPIRATORY_TRACT | Status: AC
  Filled 2014-12-03: qty 30

## 2014-12-03 MED ORDER — PROPOFOL 10 MG/ML IV BOLUS
INTRAVENOUS | Status: DC | PRN
  Administered 2014-12-03: 220 mg via INTRAVENOUS

## 2014-12-03 MED ORDER — NEOSTIGMINE METHYLSULFATE 10 MG/10ML IV SOLN
INTRAVENOUS | Status: DC | PRN
Start: 2014-12-03 — End: 2014-12-03
  Administered 2014-12-03: 4 mg via INTRAVENOUS

## 2014-12-03 MED ORDER — ROCURONIUM BROMIDE 100 MG/10ML IV SOLN
INTRAVENOUS | Status: DC | PRN
  Administered 2014-12-03: 5 mg via INTRAVENOUS
  Administered 2014-12-03: 55 mg via INTRAVENOUS
  Administered 2014-12-03: 10 mg via INTRAVENOUS

## 2014-12-03 MED ORDER — ENOXAPARIN SODIUM 40 MG/0.4ML ~~LOC~~ SOLN
40.0000 mg | SUBCUTANEOUS | Status: AC
  Administered 2014-12-03: 40 mg via SUBCUTANEOUS
  Filled 2014-12-03: qty 0.4

## 2014-12-03 MED ORDER — GLYCOPYRROLATE 0.2 MG/ML IJ SOLN
INTRAMUSCULAR | Status: AC
  Filled 2014-12-03: qty 3

## 2014-12-03 MED ORDER — SUFENTANIL CITRATE 50 MCG/ML IV SOLN
INTRAVENOUS | Status: AC
  Filled 2014-12-03: qty 1

## 2014-12-03 MED ORDER — MENTHOL 3 MG MT LOZG
1.0000 | LOZENGE | OROMUCOSAL | Status: DC | PRN
  Administered 2014-12-03: 3 mg via ORAL
  Filled 2014-12-03 (×2): qty 9

## 2014-12-03 SURGICAL SUPPLY — 48 items
BAG SPEC RTRVL LRG 6X4 10 (ENDOMECHANICALS)
CABLE HIGH FREQUENCY MONO STRZ (ELECTRODE) ×2 IMPLANT
CHLORAPREP W/TINT 26ML (MISCELLANEOUS) ×2 IMPLANT
CORDS BIPOLAR (ELECTRODE) ×2 IMPLANT
COVER SURGICAL LIGHT HANDLE (MISCELLANEOUS) ×2 IMPLANT
COVER TIP SHEARS 8 DVNC (MISCELLANEOUS) ×1 IMPLANT
COVER TIP SHEARS 8MM DA VINCI (MISCELLANEOUS) ×1
DRAPE INSTRUMENT ARM DA VINCI (DRAPES) ×1
DRAPE INSTRUMENT ARM DVNC (DRAPES) IMPLANT
DRAPE SHEET LG 3/4 BI-LAMINATE (DRAPES) ×4 IMPLANT
DRAPE SURG IRRIG POUCH 19X23 (DRAPES) ×2 IMPLANT
DRAPE TABLE BACK 44X90 PK DISP (DRAPES) ×4 IMPLANT
DRAPE WARM FLUID 44X44 (DRAPE) ×2 IMPLANT
DRSG TEGADERM 6X8 (GAUZE/BANDAGES/DRESSINGS) ×2 IMPLANT
ELECT REM PT RETURN 9FT ADLT (ELECTROSURGICAL) ×2
ELECTRODE REM PT RTRN 9FT ADLT (ELECTROSURGICAL) ×1 IMPLANT
GLOVE BIO SURGEON STRL SZ 6 (GLOVE) ×6 IMPLANT
GLOVE BIO SURGEON STRL SZ 6.5 (GLOVE) ×4 IMPLANT
GOWN STRL REUS W/ TWL LRG LVL3 (GOWN DISPOSABLE) ×3 IMPLANT
GOWN STRL REUS W/TWL LRG LVL3 (GOWN DISPOSABLE) ×6
HOLDER FOLEY CATH W/STRAP (MISCELLANEOUS) ×2 IMPLANT
KIT ACCESSORY DA VINCI DISP (KITS) ×1
KIT ACCESSORY DVNC DISP (KITS) ×1 IMPLANT
KIT BASIN OR (CUSTOM PROCEDURE TRAY) ×2 IMPLANT
LIQUID BAND (GAUZE/BANDAGES/DRESSINGS) ×2 IMPLANT
MANIPULATOR UTERINE 4.5 ZUMI (MISCELLANEOUS) ×2 IMPLANT
OCCLUDER COLPOPNEUMO (BALLOONS) ×2 IMPLANT
PEN SKIN MARKING BROAD (MISCELLANEOUS) ×2 IMPLANT
POUCH SPECIMEN RETRIEVAL 10MM (ENDOMECHANICALS) IMPLANT
SCISSORS LAP 5X35 DISP (ENDOMECHANICALS) ×1 IMPLANT
SET TUBE IRRIG SUCTION NO TIP (IRRIGATION / IRRIGATOR) ×2 IMPLANT
SHEET LAVH (DRAPES) ×2 IMPLANT
SOLUTION ELECTROLUBE (MISCELLANEOUS) ×2 IMPLANT
SUT VIC AB 0 CT1 27 (SUTURE) ×2
SUT VIC AB 0 CT1 27XBRD ANTBC (SUTURE) ×1 IMPLANT
SUT VIC AB 4-0 PS2 27 (SUTURE) ×4 IMPLANT
SUT VICRYL 0 UR6 27IN ABS (SUTURE) ×2 IMPLANT
SYR 50ML LL SCALE MARK (SYRINGE) ×2 IMPLANT
TOWEL OR 17X26 10 PK STRL BLUE (TOWEL DISPOSABLE) ×4 IMPLANT
TOWEL OR NON WOVEN STRL DISP B (DISPOSABLE) ×2 IMPLANT
TRAP SPECIMEN MUCOUS 40CC (MISCELLANEOUS) IMPLANT
TRAY FOLEY CATH 14FRSI W/METER (CATHETERS) ×2 IMPLANT
TRAY LAPAROSCOPIC (CUSTOM PROCEDURE TRAY) ×2 IMPLANT
TROCAR 12M 150ML BLUNT (TROCAR) ×2 IMPLANT
TROCAR BLADELESS OPT 5 100 (ENDOMECHANICALS) ×2 IMPLANT
TROCAR XCEL 12X100 BLDLESS (ENDOMECHANICALS) ×2 IMPLANT
TUBING INSUFFLATION 10FT LAP (TUBING) ×2 IMPLANT
WATER STERILE IRR 1500ML POUR (IV SOLUTION) ×2 IMPLANT

## 2014-12-03 NOTE — H&P (Signed)
Consult was requested by Dr. Stann Mainland for the evaluation of Miranda Marquez 54 y.o. female with endometrial hyperplasia.  CC:  Chief Complaint  Patient presents with  . endometrial hyperplasia    Assessment/Plan:  Miranda. Miranda Marquez is a 54 y.o. year old with at least complex hyperplasia with atypia on endometrial biopsy. The patient is morbidly obese. We are recommending definitive diagnostic and therapeutic intervention with robotic assisted total hysterectomy bilateral salpingo-oophorectomy and frozen section. Frozen section reveals invasive adenocarcinoma with criteria concerning for the potential for metastatic disease (tumor greater than 2 cm, outer half myometrial invasion or grade 3 tumor) we will proceed with staging including lymphadenectomy.  I discussed with Miranda Marquez surgical risks including bleeding, infection, damage to internal organs (such as bladder,ureters, bowels), blood clot, reoperation, nerve/positioning injury, conversion to laparotomy and rehospitalization. I discussed that these risks are elevated above baseline for her secondary to morbid obesity.  HPI: Miranda Marquez is A 54 year old gravida 78 para 68 who is seen in consultation at the request of Dr.Wein for at least complex endometrial hyperplasia with atypia on endometrial biopsy and resection of cervical polyp. The patient began experiencing unusual symptoms in February 2015 which included pain on the right abdomen and flank, generalized rash on extensor surfaces of her upper and lower extremities and constipation. She saw her at Dr. Stann Mainland who performed a pelvic examination and identified a cervical polyp which was removed. Pathology from this from 10/08/2014 reveals a uterine polyp with glandular hyperplasia and extensive tubal metaplasia. She return on 10/15/2014 for more comprehensive endometrial assessment with an endometrial biopsy and this revealed at least complex endometrial hyperplasia with atypia. She denies  postmenopausal bleeding.   Current Meds:  Outpatient Encounter Prescriptions as of 10/28/2014  Medication Sig  . bromocriptine (PARLODEL) 2.5 MG tablet Take 5 mg by mouth daily.  . clobetasol (TEMOVATE) 0.05 % external solution   . clobetasol cream (TEMOVATE) 0.05 %   . cyproheptadine (PERIACTIN) 4 MG tablet   . ketoconazole (NIZORAL) 2 % shampoo   . lisinopril-hydrochlorothiazide (PRINZIDE,ZESTORETIC) 10-12.5 MG per tablet Take 1 tablet by mouth every morning.  . [DISCONTINUED] ketoconazole (NIZORAL) 200 MG tablet Take 200 mg by mouth daily.    Allergy: No Known Allergies  Social Hx:  History   Social History  . Marital Status: Married    Spouse Name: N/A  . Number of Children: N/A  . Years of Education: N/A   Occupational History  . Not on file.   Social History Main Topics  . Smoking status: Former Research scientist (life sciences)  . Smokeless tobacco: Not on file  . Alcohol Use: 1.2 oz/week    2 Glasses of wine per week  . Drug Use: No  . Sexual Activity: Yes   Other Topics Concern  . Not on file   Social History Narrative  . No narrative on file    Past Surgical Hx:  Past Surgical History  Procedure Laterality Date  . Appendectomy    . Mini-laparotomy w/ tubal ligation      Past Medical Hx:  Past Medical History  Diagnosis Date  . Pituitary microadenoma with hyperprolactinemia   . Hypertension     Past Gynecological History: SVD x 4 (including 10lb baby). No postmenopausal bleeding. S/p tubal ligation. No LMP recorded. Patient is postmenopausal.  Family Hx:  Family History  Problem Relation Age of Onset  . Hypertension Father     Review of Systems:  Constitutional  Feels well,  ENT Normal appearing ears and  nares bilaterally Skin/Breast  No rash, sores, jaundice, itching, dryness Cardiovascular  No chest pain, shortness of breath, or edema   Pulmonary  No cough or wheeze.  Gastro Intestinal  No nausea, vomitting, or diarrhoea. No bright red blood per rectum, no abdominal pain, change in bowel movement, or constipation.  Genito Urinary  No frequency, urgency, dysuria,  Musculo Skeletal  No myalgia, arthralgia, joint swelling or pain  Neurologic  No weakness, numbness, change in gait,  Psychology  No depression, anxiety, insomnia.   Vitals: Blood pressure 125/81, pulse 79, temperature 98.3 F (36.8 C), temperature source Oral, resp. rate 18, height 5\' 9"  (1.753 m), weight 281 lb 4.8 oz (127.597 kg).  Physical Exam: WD in NAD Neck  Supple NROM, without any enlargements.  Lymph Node Survey No cervical supraclavicular or inguinal adenopathy Cardiovascular  Pulse normal rate, regularity and rhythm. S1 and S2 normal.  Lungs  Clear to auscultation bilateraly, without wheezes/crackles/rhonchi. Good air movement.  Skin  No rash/lesions/breakdown  Psychiatry  Alert and oriented to person, place, and time  Abdomen  Normoactive bowel sounds, abdomen soft, non-tender and obese without evidence of hernia. Transverse incision for appendectomy Back No CVA tenderness Genito Urinary  Vulva/vagina: Normal external female genitalia. No lesions. No discharge or bleeding. Bladder/urethra: No lesions or masses, well supported bladder Vagina: prolapse Cervix: Normal appearing, no lesions. Uterus: Small, mobile, no parametrial involvement or nodularity. Adnexa: no palpable masses. Rectal  Good tone, no masses no cul de sac nodularity.  Extremities  No bilateral cyanosis, clubbing or edema.   Donaciano Eva, MD

## 2014-12-03 NOTE — Anesthesia Postprocedure Evaluation (Signed)
  Anesthesia Post-op Note  Patient: Miranda Marquez  Procedure(s) Performed: Procedure(s) (LRB): ROBOTIC ASSISTED TOTAL HYSTERECTOMY WITH BILATERAL SALPINGO OOPHORECTOMY (Bilateral)  Patient Location: PACU  Anesthesia Type: General  Level of Consciousness: awake and alert   Airway and Oxygen Therapy: Patient Spontanous Breathing  Post-op Pain: mild  Post-op Assessment: Post-op Vital signs reviewed, Patient's Cardiovascular Status Stable, Respiratory Function Stable, Patent Airway and No signs of Nausea or vomiting  Last Vitals:  Filed Vitals:   12/03/14 1230  BP: 130/81  Pulse: 70  Temp: 36.7 C  Resp: 15    Post-op Vital Signs: stable   Complications: No apparent anesthesia complications

## 2014-12-03 NOTE — Op Note (Signed)
OPERATIVE NOTE  Surgeon: Donaciano Eva   Assistants: Lahoma Crocker, MD (an MD assistant was necessary for tissue manipulation, management of robotic instrumentation, retraction and positioning due to the complexity of the case and hospital policies).   Anesthesia: General endotracheal anesthesia  ASA Class: 3   Pre-operative Diagnosis: complex atypical hyperplasia with atypia  Post-operative Diagnosis: same, no invasive carcinoma on frozen section  Operation: Robotic-assisted laparoscopic hysterectomy with bilateral salpingoophorectomy, lysis of adhesions.  Surgeon: Donaciano Eva  Assistant Surgeon: Lahoma Crocker MD  Anesthesia: GET  Urine Output: 100cc  Operative Findings:  : dense adhesions between ileum and anterior abdominal wall, and between sigmoid colon and ileum and anterior abdominal wall.  1.  No extrauterine disease. 2.  A 6 cm uterus. 3.  Normal bilateral adnexa. 4.  No evidence of any extrauterine disease. 5.  Normal appearing cervix.    Estimated Blood Loss:  less than 50 mL      Total IV Fluids: 1,000 ml         Specimens: uterus, cervix, bilateral tubes and ovaries, frozen section revealed hyperplasia in a polyp without invasive carcinoma seen.         Complications:  None; patient tolerated the procedure well.         Disposition: PACU - hemodynamically stable.  Procedure Details  The patient was seen in the Holding Room. The risks, benefits, complications, treatment options, and expected outcomes were discussed with the patient.  The patient concurred with the proposed plan, giving informed consent.  The site of surgery properly noted/marked. The patient was identified as Miranda Marquez and the procedure verified as a Robotic-assisted hysterectomy with bilateral salpingo oophorectomy. A Time Out was held and the above information confirmed.  After induction of anesthesia, the patient was draped and prepped in the usual sterile  manner. Pt was placed in supine position after anesthesia and draped and prepped in the usual sterile manner. The abdominal drape was placed after the CholoraPrep had been allowed to dry for 3 minutes.  Her arms were tucked to her side with all appropriate precautions.  The chest was secured to the table.  The patient was placed in the semi-lithotomy position in [place].  The perineum was prepped with Betadine.  Foley catheter was placed.  A sterile speculum was placed in the vagina.  The cervix was grasped with a single-tooth tenaculum and dilated with Kennon Rounds dilators.  The ZUMI uterine manipulator with a medium colpotomizer ring was placed without difficulty.  A pneum occluder balloon was placed over the manipulator.  A second time-out was performed.  OG tube placement was confirmed and to suction.    Procedure:  The patient was brought to the operating room where general anesthesia was administered with no complications.  The patient was placed in the dorsal lithotomy position in padded Allen stirrups.  The arms were tucked at the sides with gel pads protecting the elbows and foam protecting the hands. The patient was then prepped.  A Foley was placed to gravity.  A medium size KOH ring was used to place around the cervix after the cervix had been dilated and then a RUMI manipulator was attached in the normal manner.  The patient was then draped in the normal manner.  Next, a 5 mm skin incision was made 1 cm below the subcostal margin in the midclavicular line.  The 5 mm Optiview port and scope was used for direct entry.  Opening pressure was under 10 mm  CO2.  The abdomen was insufflated and the findings were noted as above.   At this point and all points during the procedure, the patient's intra-abdominal pressure did not exceed 15 mmHg. Next, a 10 mm skin incision was made immediately above the umbilicus and a right and left port was placed about 10 cm lateral to the robot port on the right and left  side. The patient was placed in steep Trendelenburg.  30 minutes of sharp adhesiolysis was performed with endoshears to separate the small intestine and omentum and the and anterior abdominal wall. Bowel was away into the upper abdomen. The robot was docked in the normal manner.  The hysterectomy was started after the round ligament on the right side was incised and the retroperitoneum was entered and the pararectal space was developed.  The ureter was noted to be on the medial leaf of the broad ligament.  The peritoneum above the ureter was incised and stretched and the infundibulopelvic ligament was skeletonized, cauterized and cut.  The posterior peritoneum was taken down to the level of the KOH ring.  The anterior peritoneum was also taken down.  The bladder flap was created to the level of the KOH ring.  The uterine artery on the right side was skeletonized, cauterized and cut in the normal manner.  A similar procedure was performed on the left.  The colpotomy was made and the uterus, cervix, bilateral ovaries and tubes were amputated and delivered through the vagina.  Pedicles were inspected and excellent hemostasis was achieved.  Frozen section was returned with no invasive malignancy.  The colpotomy at the vaginal cuff was closed with Vicryl on a CT1 needle in a running manner.  Irrigation was used and excellent hemostasis was achieved.  At this point in the procedure was completed.  Robotic instruments were removed under direct visulaization.  The robot was undocked. The 10 mm port was closed with Vicryl on a UR-5 needle and the fascia was closed with 0 Vicryl on a UR-5 needle.  The skin was closed with 4-0 Vicryl in a subcuticular manner.  Steri-Strips were applied and bandages were also applied.  Sponge, lap and needle counts correct x 2.  The patient was taken to the recovery room in stable condition.  The vagina was swabbed with  minimal bleeding noted.   All instrument and needle counts were  correct x  3.   The patient was transferred to the recovery room in stable condition.  Donaciano Eva, MD

## 2014-12-03 NOTE — Transfer of Care (Signed)
Immediate Anesthesia Transfer of Care Note  Patient: Miranda Marquez  Procedure(s) Performed: Procedure(s): ROBOTIC ASSISTED TOTAL HYSTERECTOMY WITH BILATERAL SALPINGO OOPHORECTOMY (Bilateral)  Patient Location: PACU  Anesthesia Type:General  Level of Consciousness: awake, alert , oriented and patient cooperative  Airway & Oxygen Therapy: Patient Spontanous Breathing and Patient connected to face mask oxygen  Post-op Assessment: Report given to RN, Post -op Vital signs reviewed and stable and Patient moving all extremities X 4  Post vital signs: stable  Last Vitals:  Filed Vitals:   12/03/14 0602  BP: 114/72  Pulse: 78  Temp: 36.7 C  Resp: 18    Complications: No apparent anesthesia complications

## 2014-12-04 ENCOUNTER — Encounter (HOSPITAL_COMMUNITY): Payer: Self-pay | Admitting: Gynecologic Oncology

## 2014-12-04 DIAGNOSIS — N8501 Benign endometrial hyperplasia: Secondary | ICD-10-CM | POA: Diagnosis not present

## 2014-12-04 LAB — CBC
HCT: 38.4 % (ref 36.0–46.0)
Hemoglobin: 12.5 g/dL (ref 12.0–15.0)
MCH: 29.6 pg (ref 26.0–34.0)
MCHC: 32.6 g/dL (ref 30.0–36.0)
MCV: 91 fL (ref 78.0–100.0)
PLATELETS: 226 10*3/uL (ref 150–400)
RBC: 4.22 MIL/uL (ref 3.87–5.11)
RDW: 13.6 % (ref 11.5–15.5)
WBC: 9.4 10*3/uL (ref 4.0–10.5)

## 2014-12-04 LAB — BASIC METABOLIC PANEL
ANION GAP: 6 (ref 5–15)
BUN: 10 mg/dL (ref 6–23)
CO2: 26 mmol/L (ref 19–32)
Calcium: 8.4 mg/dL (ref 8.4–10.5)
Chloride: 109 mmol/L (ref 96–112)
Creatinine, Ser: 0.67 mg/dL (ref 0.50–1.10)
GLUCOSE: 137 mg/dL — AB (ref 70–99)
POTASSIUM: 3.7 mmol/L (ref 3.5–5.1)
SODIUM: 141 mmol/L (ref 135–145)

## 2014-12-04 MED ORDER — MENTHOL 3 MG MT LOZG: 1.0000 | LOZENGE | OROMUCOSAL | Status: DC | PRN

## 2014-12-04 MED ORDER — ONDANSETRON HCL 4 MG PO TABS: 4.0000 mg | ORAL_TABLET | Freq: Four times a day (QID) | ORAL | Status: DC | PRN

## 2014-12-04 MED ORDER — OXYCODONE-ACETAMINOPHEN 5-325 MG PO TABS: 1.0000 | ORAL_TABLET | ORAL | Status: DC | PRN

## 2014-12-04 MED ORDER — IBUPROFEN 800 MG PO TABS: 800.0000 mg | ORAL_TABLET | Freq: Three times a day (TID) | ORAL | Status: DC | PRN

## 2014-12-04 NOTE — Discharge Instructions (Signed)
12/04/2014  Return to work: 4 weeks  Activity: 1. Be up and out of the bed during the day.  Take a nap if needed.  You may walk up steps but be careful and use the hand rail.  Stair climbing will tire you more than you think, you may need to stop part way and rest.   2. No lifting or straining for 6 weeks.  3. No driving for 1-2 weeks.  Do Not drive if you are taking narcotic pain medicine.  4. Shower daily.  Use soap and water on your incision and pat dry; don't rub.   5. No sexual activity and nothing in the vagina for 8 weeks.  Diet: 1. Low sodium Heart Healthy Diet is recommended.  2. It is safe to use a laxative if you have difficulty moving your bowels.   Wound Care: 1. Keep clean and dry.  Shower daily.  Reasons to call the Doctor:   Fever - Oral temperature greater than 100.4 degrees Fahrenheit  Foul-smelling vaginal discharge  Difficulty urinating  Nausea and vomiting  Increased pain at the site of the incision that is unrelieved with pain medicine.  Difficulty breathing with or without chest pain  New calf pain especially if only on one side  Sudden, continuing increased vaginal bleeding with or without clots.   Follow-up: 1. See Dr Denman George in 1 month  Contacts: For questions or concerns you should contact:  Dr. Everitt Amber at [phone]

## 2014-12-04 NOTE — Discharge Summary (Signed)
Physician Discharge Summary  Patient ID: Miranda Marquez MRN: [id number] DOB/AGE: October 14, 1960 54 y.o.  Admit date: 12/03/2014 Discharge date: 12/04/2014  Admission Diagnoses: <principal problem not specified>  Discharge Diagnoses:  Active Problems:   Endometrial hyperplasia with atypia   Endometrial hyperplasia   Discharged Condition: good  Hospital Course: The patient was admitted on 12/04/14 for robotic hysterectomy, BSO for endometrial complex atypical hyperplasia. Frozen section revealed no invasive cancer. Surgery was uncomplicated. In the postoperative area the patient experienced 3 episodes of bradycardia to 30 bpm. It resolved with fluids and medication and the patient was asymptomatic. She was sent to telemetry overnight and had no issues. On POD 1 she was tolerating PO, pain was well controlled and she was meeting discharge criteria.  Consults: None  Significant Diagnostic Studies: EKG, telemetry  Treatments: surgery: see above  Discharge Exam: Blood pressure 131/75, pulse 70, temperature 98 F (36.7 C), temperature source Oral, resp. rate 16, height 5\' 9"  (1.753 m), weight 278 lb (126.1 kg), SpO2 97 %. General appearance: alert and cooperative Resp: clear to auscultation bilaterally Chest wall: no tenderness Cardio: regular rate and rhythm, S1, S2 normal, no murmur, click, rub or gallop Pelvic: vagina normal without discharge Incision/Wound: clean dry and intact laparoscopc incisions.  CBC    Component Value Date/Time   WBC 9.4 12/04/2014 0445   RBC 4.22 12/04/2014 0445   HGB 12.5 12/04/2014 0445   HCT 38.4 12/04/2014 0445   PLT 226 12/04/2014 0445   MCV 91.0 12/04/2014 0445   MCH 29.6 12/04/2014 0445   MCHC 32.6 12/04/2014 0445   RDW 13.6 12/04/2014 0445   LYMPHSABS 1.7 11/26/2014 1525   MONOABS 0.5 11/26/2014 1525   EOSABS 0.1 11/26/2014 1525   BASOSABS 0.0 11/26/2014 1525   BMET    Component Value Date/Time   NA 141 12/04/2014 0445   K 3.7 12/04/2014  0445   CL 109 12/04/2014 0445   CO2 26 12/04/2014 0445   GLUCOSE 137* 12/04/2014 0445   BUN 10 12/04/2014 0445   CREATININE 0.67 12/04/2014 0445   CALCIUM 8.4 12/04/2014 0445   GFRNONAA >90 12/04/2014 0445   GFRAA >90 12/04/2014 0445    Disposition: Discharge to home. She should followup with her primary care physician in 1-2 weeks to followup bradycardia. We discussed this.  Discharge Instructions    (HEART FAILURE PATIENTS) Call MD:  Anytime you have any of the following symptoms: 1) 3 pound weight gain in 24 hours or 5 pounds in 1 week 2) shortness of breath, with or without a dry hacking cough 3) swelling in the hands, feet or stomach 4) if you have to sleep on extra pillows at night in order to breathe.    Complete by:  As directed      Call MD for:  difficulty breathing, headache or visual disturbances    Complete by:  As directed      Call MD for:  extreme fatigue    Complete by:  As directed      Call MD for:  hives    Complete by:  As directed      Call MD for:  persistant dizziness or light-headedness    Complete by:  As directed      Call MD for:  persistant nausea and vomiting    Complete by:  As directed      Call MD for:  redness, tenderness, or signs of infection (pain, swelling, redness, odor or green/yellow discharge around incision site)  Complete by:  As directed      Call MD for:  severe uncontrolled pain    Complete by:  As directed      Call MD for:  temperature >100.4    Complete by:  As directed      Diet - low sodium heart healthy    Complete by:  As directed      Diet general    Complete by:  As directed      Driving Restrictions    Complete by:  As directed   No driving for 7 days or until off narcotic pain medication     Increase activity slowly    Complete by:  As directed      Remove dressing in 24 hours    Complete by:  As directed      Sexual Activity Restrictions    Complete by:  As directed   No intercourse for 6 weeks             Medication List    TAKE these medications        bromocriptine 2.5 MG tablet  Commonly known as:  PARLODEL  Take 5 mg by mouth every morning.     clobetasol cream 0.05 %  Commonly known as:  TEMOVATE  Apply 1 application topically 2 (two) times daily as needed (rash.).     clobetasol 0.05 % external solution  Commonly known as:  TEMOVATE  Apply 1 application topically daily as needed (rash).     ibuprofen 800 MG tablet  Commonly known as:  ADVIL,MOTRIN  Take 1 tablet (800 mg total) by mouth every 8 (eight) hours as needed (mild pain).     lisinopril-hydrochlorothiazide 10-12.5 MG per tablet  Commonly known as:  PRINZIDE,ZESTORETIC  Take 1 tablet by mouth every morning.     menthol-cetylpyridinium 3 MG lozenge  Commonly known as:  CEPACOL  Take 1 lozenge (3 mg total) by mouth as needed for sore throat.     ondansetron 4 MG tablet  Commonly known as:  ZOFRAN  Take 1 tablet (4 mg total) by mouth every 6 (six) hours as needed for nausea.     oxyCODONE-acetaminophen 5-325 MG per tablet  Commonly known as:  PERCOCET/ROXICET  Take 1-2 tablets by mouth every 4 (four) hours as needed (moderate to severe pain (when tolerating fluids)).           Follow-up Information    Follow up with Donaciano Eva, MD In 1 month.   Specialty:  Obstetrics and Gynecology   Contact information:   [place] [address] [phone]       Signed: Donaciano Eva 12/04/2014, 11:33 AM

## 2014-12-06 ENCOUNTER — Telehealth: Payer: Self-pay | Admitting: *Deleted

## 2014-12-06 NOTE — Telephone Encounter (Signed)
Spoke to pt gave pathology results, appt given for 6 week f/u. Pt verbalized understanding. No further concerns.

## 2014-12-06 NOTE — Telephone Encounter (Signed)
Called pt with pathology results: unable to reach pt, lmovm to call office for results: Message for pt upon call back: No cancer, showed hyperplasia.

## 2015-01-15 ENCOUNTER — Ambulatory Visit: Payer: BLUE CROSS/BLUE SHIELD | Attending: Gynecologic Oncology | Admitting: Gynecologic Oncology

## 2015-01-15 ENCOUNTER — Encounter: Payer: Self-pay | Admitting: Gynecologic Oncology

## 2015-01-15 VITALS — BP 126/73 | HR 75 | Temp 98.5°F | Resp 16 | Ht 69.0 in | Wt 273.9 lb

## 2015-01-15 DIAGNOSIS — Z90710 Acquired absence of both cervix and uterus: Secondary | ICD-10-CM | POA: Diagnosis not present

## 2015-01-15 DIAGNOSIS — N8502 Endometrial intraepithelial neoplasia [EIN]: Secondary | ICD-10-CM | POA: Diagnosis not present

## 2015-01-15 NOTE — Progress Notes (Signed)
POSTOPERATIVE EVALUATION  Assessment:    54 y.o. year old with complex atypical hystereplasia.   S/p robotic hysterectomy, BSO on 12/03/14.   Plan: 1) Pathology reports reviewed today 2) Treatment counseling - I discussed the preinvasive nature of this lesion and the fact that it is caused by obesity.  She was given the opportunity to ask questions, which were answered to her satisfaction, and she is agreement with the above mentioned plan of care.  3)  Return to clinic on a prn basis. I recommended she continued annual well woman followup with Dr Stann Mainland.  HPI:  Miranda Marquez is a 54 y.o. year old with endometrial hyperplasia with atypia.  She then underwent a robotic hysterectomy and BSO on 12/15/93 without complications.  Her postoperative course was uncomplicated.  Her final pathology revealed CAH only without invasive cancer.  She is seen today for a postoperative check and to discuss her pathology results and ongoing plan.  Since discharge from the hospital, she is feeling well.  She has improving appetite, normal bowel and bladder function, and pain controlled with minimal PO medication. She has no other complaints today.    Review of systems: Constitutional:  She has no weight gain or weight loss. She has no fever or chills. Eyes: No blurred vision Ears, Nose, Mouth, Throat: No dizziness, headaches or changes in hearing. No mouth sores. Cardiovascular: No chest pain, palpitations or edema. Respiratory:  No shortness of breath, wheezing or cough Gastrointestinal: She has normal bowel movements without diarrhea or constipation. She denies any nausea or vomiting. She denies blood in her stool or heart burn. Genitourinary:  She denies pelvic pain, pelvic pressure or changes in her urinary function. She has no hematuria, dysuria, or incontinence. She has no irregular vaginal bleeding or vaginal discharge Musculoskeletal: Denies muscle weakness or joint pains.  Skin:  She has no skin changes,  rashes or itching Neurological:  Denies dizziness or headaches. No neuropathy, no numbness or tingling. Psychiatric:  She denies depression or anxiety. Hematologic/Lymphatic:   No easy bruising or bleeding   Physical Exam: Blood pressure 126/73, pulse 75, temperature 98.5 F (36.9 C), temperature source Oral, resp. rate 16, height 5\' 9"  (1.753 m), weight 273 lb 14.4 oz (124.24 kg), SpO2 98 %. General: Well dressed, well nourished in no apparent distress.   HEENT:  Normocephalic and atraumatic, no lesions.  Extraocular muscles intact. Sclerae anicteric. Pupils equal, round, reactive. No mouth sores or ulcers. Thyroid is normal size, not nodular, midline. Skin:  No lesions or rashes. Breasts:  deferred Lungs:  deferred Cardiovascular:  deferred Abdomen:  Soft, nontender, nondistended.  No palpable masses.  No hepatosplenomegaly.  No ascites. Normal bowel sounds.  No hernias.  Incisions are healing well. Genitourinary: Normal EGBUS  Vaginal cuff intact.  No bleeding or discharge.  No cul de sac fullness. Extremities: No cyanosis, clubbing or edema.  No calf tenderness or erythema. No palpable cords. Psychiatric: Mood and affect are appropriate. Neurological: Awake, alert and oriented x 3. Sensation is intact, no neuropathy.  Musculoskeletal: No pain, normal strength and range of motion.   Donaciano Eva, MD

## 2015-01-15 NOTE — Patient Instructions (Signed)
Please follow up with Dr. Stann Mainland for your annual physical exams. Please call our office with any questions or concerns.

## 2015-07-08 ENCOUNTER — Other Ambulatory Visit: Payer: Self-pay | Admitting: Surgery

## 2015-07-08 DIAGNOSIS — D241 Benign neoplasm of right breast: Secondary | ICD-10-CM

## 2015-07-24 HISTORY — PX: BREAST EXCISIONAL BIOPSY: SUR124

## 2015-08-01 ENCOUNTER — Other Ambulatory Visit: Payer: Self-pay | Admitting: Surgery

## 2015-08-01 DIAGNOSIS — D241 Benign neoplasm of right breast: Secondary | ICD-10-CM

## 2015-08-06 ENCOUNTER — Encounter (HOSPITAL_COMMUNITY): Payer: Self-pay

## 2015-08-06 ENCOUNTER — Ambulatory Visit
Admission: RE | Admit: 2015-08-06 | Discharge: 2015-08-06 | Disposition: A | Discharge status: Home / Self Care | Payer: BLUE CROSS/BLUE SHIELD | Source: Ambulatory Visit | Attending: Surgery | Admitting: Surgery

## 2015-08-06 ENCOUNTER — Encounter (HOSPITAL_COMMUNITY)
Admission: RE | Admit: 2015-08-06 | Discharge: 2015-08-06 | Disposition: A | Discharge status: Home / Self Care | Payer: BLUE CROSS/BLUE SHIELD | Source: Ambulatory Visit | Attending: Surgery | Admitting: Surgery

## 2015-08-06 ENCOUNTER — Other Ambulatory Visit (HOSPITAL_COMMUNITY): Payer: Self-pay | Admitting: *Deleted

## 2015-08-06 DIAGNOSIS — Z6841 Body Mass Index (BMI) 40.0 and over, adult: Secondary | ICD-10-CM | POA: Diagnosis not present

## 2015-08-06 DIAGNOSIS — Z90710 Acquired absence of both cervix and uterus: Secondary | ICD-10-CM | POA: Diagnosis not present

## 2015-08-06 DIAGNOSIS — D241 Benign neoplasm of right breast: Secondary | ICD-10-CM

## 2015-08-06 DIAGNOSIS — E119 Type 2 diabetes mellitus without complications: Secondary | ICD-10-CM | POA: Diagnosis not present

## 2015-08-06 DIAGNOSIS — Z87891 Personal history of nicotine dependence: Secondary | ICD-10-CM | POA: Diagnosis not present

## 2015-08-06 HISTORY — DX: Gestational diabetes mellitus in pregnancy, unspecified control: O24.419

## 2015-08-06 HISTORY — DX: Other complications of anesthesia, initial encounter: T88.59XA

## 2015-08-06 HISTORY — DX: Other cervical disc displacement, unspecified cervical region: M50.20

## 2015-08-06 HISTORY — DX: Adverse effect of unspecified anesthetic, initial encounter: T41.45XA

## 2015-08-06 LAB — CBC
HEMATOCRIT: 41.8 % (ref 36.0–46.0)
HEMOGLOBIN: 14.1 g/dL (ref 12.0–15.0)
MCH: 29.8 pg (ref 26.0–34.0)
MCHC: 33.7 g/dL (ref 30.0–36.0)
MCV: 88.4 fL (ref 78.0–100.0)
Platelets: 241 10*3/uL (ref 150–400)
RBC: 4.73 MIL/uL (ref 3.87–5.11)
RDW: 13.7 % (ref 11.5–15.5)
WBC: 5 10*3/uL (ref 4.0–10.5)

## 2015-08-06 MED ORDER — CEFAZOLIN SODIUM 10 G IJ SOLR
3.0000 g | INTRAMUSCULAR | Status: AC
  Administered 2015-08-07: 3 g via INTRAVENOUS
  Filled 2015-08-06: qty 3000

## 2015-08-06 NOTE — H&P (Signed)
  Miranda Marquez  Location: [facility] Patient #: [id number] DOB: 1960-11-18 Married / Language: English / Race: White Female   History of Present Illness  Patient words: breast f/u.  The patient is a 54 year old female who presents with a complaint of Breast problems. She is here for follow-up regarding her right breast papilloma and breast lipomas. She has now had her hysterectomy and she did not have invasive cancer. She is now interested in going ahead and having the papillomas of breast lipomas removed.  She is otherwise doing well   Allergies Marjean Donna, CMA; No Known Drug Allergies03/10/2014  Medication History  Bromocriptine Mesylate (2.5MG  Tablet, Oral) Active. Medications Reconciled Advil (200MG  Capsule, Oral as needed) Active.  Vitals  Weight: 282 lb Height: 69in Body Surface Area: 2.39 m Body Mass Index: 41.64 kg/m  Temp.: 98.26F(Temporal)  Pulse: 76 (Regular)  BP: 130/72 (Sitting, Left Arm, Standard)   Physical Exam The physical exam findings are as follows: Note:Again on exam she is well appearance There is a palpable mass in the 3:00 position of the right breast and laterally at the 9:00 position almost in the axilla. I cannot palpate the papilloma Lungs clear CV RRR Abdomen soft, NT/ND    Assessment & Plan   PAPILLOMA OF BREAST (D24.9)  Impression: She will now be scheduled for a radioactive seed localized right breast lumpectomy for the papilloma as well as excision of the other 2 masses which I suspect are lipomas in the right breast. I again discussed the risks with her in detail and she wished to proceed with surgery

## 2015-08-06 NOTE — Pre-Procedure Instructions (Signed)
    Miranda Marquez  08/06/2015      [id number] - [place], [place] - [address] [name] [address] Phone: [phone] Fax: [phone]    Your procedure is scheduled on Thursday, August 07, 2015 at 9:00 AM.   Report to [facility] Entrance "A" Admitting Office at 7:00 AM.   Call this number if you have problems the morning of surgery: [phone]    Remember:  Do not eat food or drink liquids after midnight tonight.  Take these medicines the morning of surgery with A SIP OF WATER: Bromocriptine (Parlodel)   Do not wear jewelry, make-up or nail polish.  Do not wear lotions, powders, or perfumes.  You may wear deodorant.  Do not shave 48 hours prior to surgery.   Do not bring valuables to the hospital.  [facility] is not responsible for any belongings or valuables.  Contacts, dentures or bridgework may not be worn into surgery.  Leave your suitcase in the car.  After surgery it may be brought to your room.  For patients admitted to the hospital, discharge time will be determined by your treatment team.  Patients discharged the day of surgery will not be allowed to drive home.   Special instructions:  See "Preparing for Surgery" Instruction sheet.  Please read over the following fact sheets that you were given. Pain Booklet, Coughing and Deep Breathing and Surgical Site Infection Prevention

## 2015-08-07 ENCOUNTER — Ambulatory Visit (HOSPITAL_COMMUNITY)
Admission: RE | Admit: 2015-08-07 | Discharge: 2015-08-07 | Disposition: A | Discharge status: Home / Self Care | Payer: BLUE CROSS/BLUE SHIELD | Source: Ambulatory Visit | Attending: Surgery | Admitting: Surgery

## 2015-08-07 ENCOUNTER — Encounter (HOSPITAL_COMMUNITY): Payer: Self-pay | Admitting: *Deleted

## 2015-08-07 ENCOUNTER — Ambulatory Visit (HOSPITAL_COMMUNITY): Payer: BLUE CROSS/BLUE SHIELD | Admitting: Anesthesiology

## 2015-08-07 ENCOUNTER — Encounter (HOSPITAL_COMMUNITY)
Admission: RE | Disposition: A | Discharge status: Home / Self Care | Payer: Self-pay | Source: Ambulatory Visit | Attending: Surgery

## 2015-08-07 ENCOUNTER — Ambulatory Visit
Admission: RE | Admit: 2015-08-07 | Discharge: 2015-08-07 | Disposition: A | Discharge status: Home / Self Care | Payer: BLUE CROSS/BLUE SHIELD | Source: Ambulatory Visit | Attending: Surgery | Admitting: Surgery

## 2015-08-07 DIAGNOSIS — E119 Type 2 diabetes mellitus without complications: Secondary | ICD-10-CM | POA: Insufficient documentation

## 2015-08-07 DIAGNOSIS — D241 Benign neoplasm of right breast: Secondary | ICD-10-CM | POA: Insufficient documentation

## 2015-08-07 DIAGNOSIS — Z90710 Acquired absence of both cervix and uterus: Secondary | ICD-10-CM | POA: Insufficient documentation

## 2015-08-07 DIAGNOSIS — Z87891 Personal history of nicotine dependence: Secondary | ICD-10-CM | POA: Insufficient documentation

## 2015-08-07 DIAGNOSIS — Z6841 Body Mass Index (BMI) 40.0 and over, adult: Secondary | ICD-10-CM | POA: Insufficient documentation

## 2015-08-07 HISTORY — PX: BREAST LUMPECTOMY WITH RADIOACTIVE SEED LOCALIZATION: SHX6424

## 2015-08-07 LAB — PROTIME-INR
INR: 1.01 (ref 0.00–1.49)
PROTHROMBIN TIME: 13.5 s (ref 11.6–15.2)

## 2015-08-07 SURGERY — BREAST LUMPECTOMY WITH RADIOACTIVE SEED LOCALIZATION
Anesthesia: General | Site: Breast | Laterality: Right

## 2015-08-07 MED ORDER — BUPIVACAINE-EPINEPHRINE 0.25% -1:200000 IJ SOLN
INTRAMUSCULAR | Status: DC | PRN
  Administered 2015-08-07: 20 mL

## 2015-08-07 MED ORDER — 0.9 % SODIUM CHLORIDE (POUR BTL) OPTIME
TOPICAL | Status: DC | PRN
  Administered 2015-08-07: 1000 mL

## 2015-08-07 MED ORDER — FENTANYL CITRATE (PF) 100 MCG/2ML IJ SOLN: 25.0000 ug | INTRAMUSCULAR | Status: DC | PRN

## 2015-08-07 MED ORDER — SODIUM CHLORIDE 0.9 % IJ SOLN: 3.0000 mL | Freq: Two times a day (BID) | INTRAMUSCULAR | Status: DC

## 2015-08-07 MED ORDER — MIDAZOLAM HCL 2 MG/2ML IJ SOLN
INTRAMUSCULAR | Status: AC
  Filled 2015-08-07: qty 2

## 2015-08-07 MED ORDER — HYDROCODONE-ACETAMINOPHEN 5-325 MG PO TABS: 1.0000 | ORAL_TABLET | ORAL | Status: DC | PRN

## 2015-08-07 MED ORDER — FENTANYL CITRATE (PF) 250 MCG/5ML IJ SOLN
INTRAMUSCULAR | Status: AC
  Filled 2015-08-07: qty 5

## 2015-08-07 MED ORDER — BUPIVACAINE-EPINEPHRINE (PF) 0.25% -1:200000 IJ SOLN
INTRAMUSCULAR | Status: AC
  Filled 2015-08-07: qty 30

## 2015-08-07 MED ORDER — ONDANSETRON HCL 4 MG/2ML IJ SOLN
INTRAMUSCULAR | Status: DC | PRN
  Administered 2015-08-07: 4 mg via INTRAVENOUS

## 2015-08-07 MED ORDER — PROPOFOL 10 MG/ML IV BOLUS
INTRAVENOUS | Status: AC
  Filled 2015-08-07: qty 20

## 2015-08-07 MED ORDER — FENTANYL CITRATE (PF) 100 MCG/2ML IJ SOLN
INTRAMUSCULAR | Status: DC | PRN
  Administered 2015-08-07: 50 ug via INTRAVENOUS

## 2015-08-07 MED ORDER — ACETAMINOPHEN 650 MG RE SUPP: 650.0000 mg | RECTAL | Status: DC | PRN

## 2015-08-07 MED ORDER — ACETAMINOPHEN 325 MG PO TABS: 650.0000 mg | ORAL_TABLET | ORAL | Status: DC | PRN

## 2015-08-07 MED ORDER — LACTATED RINGERS IV SOLN
INTRAVENOUS | Status: DC
  Administered 2015-08-07 (×2): via INTRAVENOUS

## 2015-08-07 MED ORDER — OXYCODONE HCL 5 MG/5ML PO SOLN: 5.0000 mg | Freq: Once | ORAL | Status: DC | PRN

## 2015-08-07 MED ORDER — OXYCODONE HCL 5 MG PO TABS: 5.0000 mg | ORAL_TABLET | ORAL | Status: DC | PRN

## 2015-08-07 MED ORDER — OXYCODONE HCL 5 MG PO TABS: 5.0000 mg | ORAL_TABLET | Freq: Once | ORAL | Status: DC | PRN

## 2015-08-07 MED ORDER — PROPOFOL 10 MG/ML IV BOLUS
INTRAVENOUS | Status: DC | PRN
  Administered 2015-08-07: 200 mg via INTRAVENOUS

## 2015-08-07 MED ORDER — SODIUM CHLORIDE 0.9 % IV SOLN: 250.0000 mL | INTRAVENOUS | Status: DC | PRN

## 2015-08-07 MED ORDER — ONDANSETRON HCL 4 MG/2ML IJ SOLN: 4.0000 mg | Freq: Four times a day (QID) | INTRAMUSCULAR | Status: DC | PRN

## 2015-08-07 MED ORDER — SODIUM CHLORIDE 0.9 % IJ SOLN: 3.0000 mL | INTRAMUSCULAR | Status: DC | PRN

## 2015-08-07 MED ORDER — EPHEDRINE SULFATE 50 MG/ML IJ SOLN
INTRAMUSCULAR | Status: DC | PRN
  Administered 2015-08-07 (×4): 5 mg via INTRAVENOUS

## 2015-08-07 MED ORDER — MORPHINE SULFATE (PF) 2 MG/ML IV SOLN: 1.0000 mg | INTRAVENOUS | Status: DC | PRN

## 2015-08-07 MED ORDER — MIDAZOLAM HCL 5 MG/5ML IJ SOLN
INTRAMUSCULAR | Status: DC | PRN
  Administered 2015-08-07: 2 mg via INTRAVENOUS

## 2015-08-07 MED ORDER — LIDOCAINE HCL (CARDIAC) 20 MG/ML IV SOLN
INTRAVENOUS | Status: DC | PRN
  Administered 2015-08-07: 100 mg via INTRAVENOUS

## 2015-08-07 SURGICAL SUPPLY — 43 items
APPLIER CLIP 9.375 MED OPEN (MISCELLANEOUS) ×2
APR CLP MED 9.3 20 MLT OPN (MISCELLANEOUS) ×1
BINDER BREAST LRG (GAUZE/BANDAGES/DRESSINGS) IMPLANT
BINDER BREAST XLRG (GAUZE/BANDAGES/DRESSINGS) ×1 IMPLANT
BLADE SURG 15 STRL LF DISP TIS (BLADE) ×1 IMPLANT
BLADE SURG 15 STRL SS (BLADE) ×2
CANISTER SUCTION 2500CC (MISCELLANEOUS) ×2 IMPLANT
CHLORAPREP W/TINT 26ML (MISCELLANEOUS) ×2 IMPLANT
CLIP APPLIE 9.375 MED OPEN (MISCELLANEOUS) ×1 IMPLANT
CONT SPEC 4OZ CLIKSEAL STRL BL (MISCELLANEOUS) ×1 IMPLANT
COVER PROBE W GEL 5X96 (DRAPES) ×2 IMPLANT
COVER SURGICAL LIGHT HANDLE (MISCELLANEOUS) ×2 IMPLANT
DEVICE DUBIN SPECIMEN MAMMOGRA (MISCELLANEOUS) ×2 IMPLANT
DRAPE CHEST BREAST 15X10 FENES (DRAPES) ×2 IMPLANT
DRAPE UTILITY W/TAPE 26X15 (DRAPES) ×2 IMPLANT
ELECT CAUTERY BLADE 6.4 (BLADE) ×2 IMPLANT
ELECT REM PT RETURN 9FT ADLT (ELECTROSURGICAL) ×2
ELECTRODE REM PT RTRN 9FT ADLT (ELECTROSURGICAL) ×1 IMPLANT
GLOVE BIO SURGEON STRL SZ7 (GLOVE) ×2 IMPLANT
GLOVE BIOGEL PI IND STRL 7.0 (GLOVE) IMPLANT
GLOVE BIOGEL PI INDICATOR 7.0 (GLOVE) ×1
GLOVE SURG SIGNA 7.5 PF LTX (GLOVE) ×2 IMPLANT
GOWN STRL REUS W/ TWL LRG LVL3 (GOWN DISPOSABLE) ×1 IMPLANT
GOWN STRL REUS W/ TWL XL LVL3 (GOWN DISPOSABLE) ×1 IMPLANT
GOWN STRL REUS W/TWL LRG LVL3 (GOWN DISPOSABLE) ×2
GOWN STRL REUS W/TWL XL LVL3 (GOWN DISPOSABLE) ×2
KIT BASIN OR (CUSTOM PROCEDURE TRAY) ×2 IMPLANT
KIT MARKER MARGIN INK (KITS) ×2 IMPLANT
LIQUID BAND (GAUZE/BANDAGES/DRESSINGS) ×2 IMPLANT
NDL HYPO 25X1 1.5 SAFETY (NEEDLE) ×1 IMPLANT
NEEDLE HYPO 25X1 1.5 SAFETY (NEEDLE) ×2 IMPLANT
NS IRRIG 1000ML POUR BTL (IV SOLUTION) ×1 IMPLANT
PACK SURGICAL SETUP 50X90 (CUSTOM PROCEDURE TRAY) ×2 IMPLANT
PENCIL BUTTON HOLSTER BLD 10FT (ELECTRODE) ×2 IMPLANT
SPONGE LAP 18X18 X RAY DECT (DISPOSABLE) ×2 IMPLANT
SUT MNCRL AB 4-0 PS2 18 (SUTURE) ×3 IMPLANT
SUT VIC AB 3-0 SH 18 (SUTURE) ×2 IMPLANT
SYR BULB 3OZ (MISCELLANEOUS) ×2 IMPLANT
SYR CONTROL 10ML LL (SYRINGE) ×2 IMPLANT
TOWEL OR 17X24 6PK STRL BLUE (TOWEL DISPOSABLE) ×1 IMPLANT
TOWEL OR 17X26 10 PK STRL BLUE (TOWEL DISPOSABLE) ×2 IMPLANT
TUBE CONNECTING 12X1/4 (SUCTIONS) ×2 IMPLANT
YANKAUER SUCT BULB TIP NO VENT (SUCTIONS) ×2 IMPLANT

## 2015-08-07 NOTE — Transfer of Care (Signed)
Immediate Anesthesia Transfer of Care Note  Patient: Miranda Marquez  Procedure(s) Performed: Procedure(s): RIGHT BREAST LUMPECTOMY WITH RADIOACTIVE SEED LOCALIZATION AND EXCISION OF BREAST MASS X2 (Right)  Patient Location: PACU  Anesthesia Type:General  Level of Consciousness: awake, alert  and oriented  Airway & Oxygen Therapy: Patient connected to nasal cannula oxygen  Post-op Assessment: Post -op Vital signs reviewed and stable  Post vital signs: stable  Last Vitals:  Filed Vitals:   08/07/15 0725  BP: 141/71  Pulse: 74  Temp: 36.2 C  Resp: 16    Complications: No apparent anesthesia complications

## 2015-08-07 NOTE — Discharge Instructions (Signed)
[facility] °Office Phone Number [phone] ° °BREAST BIOPSY/ PARTIAL MASTECTOMY: POST OP INSTRUCTIONS ° °Always review your discharge instruction sheet given to you by the facility where your surgery was performed. ° °IF YOU HAVE DISABILITY OR FAMILY LEAVE FORMS, YOU MUST BRING THEM TO THE OFFICE FOR PROCESSING.  DO NOT GIVE THEM TO YOUR DOCTOR. ° °1. A prescription for pain medication may be given to you upon discharge.  Take your pain medication as prescribed, if needed.  If narcotic pain medicine is not needed, then you may take acetaminophen (Tylenol) or ibuprofen (Advil) as needed. °2. Take your usually prescribed medications unless otherwise directed °3. If you need a refill on your pain medication, please contact your pharmacy.  They will contact our office to request authorization.  Prescriptions will not be filled after 5pm or on week-ends. °4. You should eat very light the first 24 hours after surgery, such as soup, crackers, pudding, etc.  Resume your normal diet the day after surgery. °5. Most patients will experience some swelling and bruising in the breast.  Ice packs and a good support bra will help.  Swelling and bruising can take several days to resolve.  °6. It is common to experience some constipation if taking pain medication after surgery.  Increasing fluid intake and taking a stool softener will usually help or prevent this problem from occurring.  A mild laxative (Milk of Magnesia or Miralax) should be taken according to package directions if there are no bowel movements after 48 hours. °7. Unless discharge instructions indicate otherwise, you may remove your bandages 24-48 hours after surgery, and you may shower at that time.  You may have steri-strips (small skin tapes) in place directly over the incision.  These strips should be left on the skin for 7-10 days.  If your surgeon used skin glue on the incision, you may shower in 24 hours.  The glue will flake off over the  next 2-3 weeks.  Any sutures or staples will be removed at the office during your follow-up visit. °8. ACTIVITIES:  You may resume regular daily activities (gradually increasing) beginning the next day.  Wearing a good support bra or sports bra minimizes pain and swelling.  You may have sexual intercourse when it is comfortable. °a. You may drive when you no longer are taking prescription pain medication, you can comfortably wear a seatbelt, and you can safely maneuver your car and apply brakes. °b. RETURN TO WORK:  ______________________________________________________________________________________ °9. You should see your doctor in the office for a follow-up appointment approximately two weeks after your surgery.  Your doctor’s nurse will typically make your follow-up appointment when she calls you with your pathology report.  Expect your pathology report 2-3 business days after your surgery.  You may call to check if you do not hear from us after three days. °10. OTHER INSTRUCTIONS: _______________________________________________________________________________________________ _____________________________________________________________________________________________________________________________________ °_____________________________________________________________________________________________________________________________________ °_____________________________________________________________________________________________________________________________________ ° °WHEN TO CALL YOUR DOCTOR: °1. Fever over 101.0 °2. Nausea and/or vomiting. °3. Extreme swelling or bruising. °4. Continued bleeding from incision. °5. Increased pain, redness, or drainage from the incision. ° °The clinic staff is available to answer your questions during regular business hours.  Please don’t hesitate to call and ask to speak to one of the nurses for clinical concerns.  If you have a medical emergency, go to the nearest  emergency room or call 911.  A surgeon from [facility] is always on call at the hospital. ° °For further questions, please visit [email or web address]  °

## 2015-08-07 NOTE — Interval H&P Note (Signed)
History and Physical Interval Note:no change in H and P  08/07/2015 8:17 AM  Miranda Marquez  has presented today for surgery, with the diagnosis of RIGHT BREAST PAPILLOMA, RIGHT BREAST MASS X'S 2  The various methods of treatment have been discussed with the patient and family. After consideration of risks, benefits and other options for treatment, the patient has consented to  Procedure(s): BREAST LUMPECTOMY WITH RADIOACTIVE SEED LOCALIZATION AND EXCISION OF BREAST MASS X'S 2 (Right) as a surgical intervention .  The patient's history has been reviewed, patient examined, no change in status, stable for surgery.  I have reviewed the patient's chart and labs.  Questions were answered to the patient's satisfaction.     Florestine Carmical A

## 2015-08-07 NOTE — Anesthesia Procedure Notes (Signed)
Procedure Name: LMA Insertion Date/Time: 08/07/2015 8:47 AM Performed by: Clearnce Sorrel Pre-anesthesia Checklist: Patient identified, Timeout performed, Emergency Drugs available, Suction available and Patient being monitored Patient Re-evaluated:Patient Re-evaluated prior to inductionOxygen Delivery Method: Circle system utilized Preoxygenation: Pre-oxygenation with 100% oxygen Intubation Type: IV induction LMA: LMA inserted LMA Size: 4.0 Number of attempts: 1 Placement Confirmation: breath sounds checked- equal and bilateral and positive ETCO2 Tube secured with: Tape Dental Injury: Teeth and Oropharynx as per pre-operative assessment

## 2015-08-07 NOTE — Progress Notes (Signed)
Pt is unable to get rings off either had and states that in the past they have just taped them. I explained potential risks with having metal on during surgery and I spoke with Dr Marcie Bal and he will reinforce the risks with her but I will tape them for now.

## 2015-08-07 NOTE — Anesthesia Preprocedure Evaluation (Signed)
Anesthesia Evaluation  Patient identified by MRN, date of birth, ID band Patient awake    Reviewed: Allergy & Precautions, NPO status , Patient's Chart, lab work & pertinent test results  Airway Mallampati: II   Neck ROM: full    Dental   Pulmonary former smoker,    breath sounds clear to auscultation       Cardiovascular hypertension,  Rhythm:regular Rate:Normal     Neuro/Psych    GI/Hepatic   Endo/Other  diabetes, Type 2Morbid obesity  Renal/GU      Musculoskeletal  (+) Arthritis ,   Abdominal   Peds  Hematology   Anesthesia Other Findings   Reproductive/Obstetrics                             Anesthesia Physical Anesthesia Plan  ASA: II  Anesthesia Plan: General   Post-op Pain Management:    Induction: Intravenous  Airway Management Planned: LMA  Additional Equipment:   Intra-op Plan:   Post-operative Plan:   Informed Consent: I have reviewed the patients History and Physical, chart, labs and discussed the procedure including the risks, benefits and alternatives for the proposed anesthesia with the patient or authorized representative who has indicated his/her understanding and acceptance.     Plan Discussed with: CRNA, Anesthesiologist and Surgeon  Anesthesia Plan Comments:         Anesthesia Quick Evaluation

## 2015-08-07 NOTE — Op Note (Signed)
RIGHT BREAST LUMPECTOMY WITH RADIOACTIVE SEED LOCALIZATION AND EXCISION OF BREAST MASS X2  Procedure Note  Miranda Marquez 08/07/2015   Pre-op Diagnosis: RIGHT BREAST PAPILLOMA, RIGHT BREAST MASS X2     Post-op Diagnosis: same  Procedure(s): RIGHT BREAST LUMPECTOMY WITH RADIOACTIVE SEED LOCALIZATION AND EXCISION OF BREAST MASS X2  Surgeon(s): Coralie Keens, MD  Anesthesia: General  Staff:  Circulator: Harrel Lemon, RN; Murlean Caller Scrub Person: Jim Like Leggio  Estimated Blood Loss: Minimal               Specimens: sent to path          [facility]   Date: 08/07/2015  Time: 9:28 AM

## 2015-08-07 NOTE — Anesthesia Postprocedure Evaluation (Signed)
Anesthesia Post Note  Patient: Miranda Marquez  Procedure(s) Performed: Procedure(s) (LRB): RIGHT BREAST LUMPECTOMY WITH RADIOACTIVE SEED LOCALIZATION AND EXCISION OF BREAST MASS X2 (Right)  Patient location during evaluation: PACU Anesthesia Type: General Level of consciousness: awake and alert Pain management: pain level controlled Vital Signs Assessment: post-procedure vital signs reviewed and stable Respiratory status: spontaneous breathing, nonlabored ventilation and respiratory function stable Cardiovascular status: blood pressure returned to baseline and stable Postop Assessment: no signs of nausea or vomiting Anesthetic complications: no    Last Vitals:  Filed Vitals:   08/07/15 1000 08/07/15 1010  BP: 126/89 116/66  Pulse: 78   Temp: 36.4 C   Resp: 21 18    Last Pain: There were no vitals filed for this visit.               Izick Gasbarro A

## 2015-08-07 NOTE — Progress Notes (Signed)
Pt state that when she had the seed placed she bleed a lot with it hard to stop.  Has been taking Ibuprofen at home and thinks that is why.

## 2015-08-07 NOTE — Op Note (Signed)
Miranda Marquez, Miranda Marquez NO.:  [id number]  MEDICAL RECORD NO.:  [id number]  LOCATION:  [facility]                         FACILITY:  [facility]  PHYSICIAN:  Coralie Keens, M.D. DATE OF BIRTH:  12-Apr-1961  DATE OF PROCEDURE:  08/07/2015 DATE OF DISCHARGE:  08/07/2015                              OPERATIVE REPORT   PREOPERATIVE DIAGNOSES: 1. Right breast papilloma. 2. Right breast mass x2.  POSTOPERATIVE DIAGNOSIS: 1. Right breast papilloma. 2. Right breast mass x2.  PROCEDURES: 1. Radioactive seed localized right breast lumpectomy. 2. Excision of right breast mass x2.  SURGEON:  Coralie Keens, M.D.  ANESTHESIA:  General and 0.25% Marcaine.  ESTIMATED BLOOD LOSS:  Minimal.  INDICATIONS:  This is a 54 year old female, who has a papilloma confirmed on stereotactic biopsy of the right breast.  She has 2 other separate masses which are suspected to be lipomas.  The decision was made to proceed with radioactive seed lumpectomy of the papilloma and excision of the 2 lipomas.  FINDINGS:  The radioactive seed was easily identified and excised with a lumpectomy.  This included one of the lipomas and a separate smaller lipoma adjacent to this as well.  The radioactive seed and suspicious lesion which was the papilloma was easily excised with a lumpectomy. The adjacent breast mass was a lipoma, and there was another smaller one adjacent to this as well which was excised.  The lateral breast mass was also consistent with lipoma.  All were sent to Pathology for evaluation.  PROCEDURE IN DETAIL:  The patient had been identified in the holding area as Miranda Marquez.  Neoprobe was used to confirm that the radioactive seed was indeed in the right breast.  She was then taken to the operating room, placed supine position on the operating table, and general anesthesia was induced.  Her right breast and axilla were then prepped and draped in usual sterile fashion.  The radioactive  seed was in the medial aspect of the breast adjacent to the areola that was also right near the palpable breast mass.  I anesthetized the skin on the medial edge of the areolar flap with Marcaine.  I made a circumareolar incision with a scalpel.  I took this down to the breast tissue with electrocautery.  I performed a lumpectomy going around the radioactive seed.  I identified the breast mass which appeared consistent with lipoma and a small lipoma was adjacent to this as well were excised and sent to Pathology, one remained with the lumpectomy specimen.  I was able to completely dissect out well around the radioactive seed and then performed a lumpectomy completely.  I then confirmed that the seed was in the lumpectomy specimen with the Neoprobe.  I marked all margins with marker paint and an x-ray was performed confirming that the radioactive seed and previous marker were in the breast.  This was then sent to Pathology for evaluation.  I evaluated the wound further and found no other masses.  I achieved hemostasis with cautery.  I then closed the skin with interrupted 3-0 Vicryl sutures and running 4-0 Monocryl suture.  I then anesthetized the skin of the lateral breast near  the flank over the palpable mass with Marcaine.  I made an incision with a scalpel, took this down to the breast tissue and confirmed what appeared to be an another lipoma which was completely excised.  It was again sent to Pathology for evaluation.  Hemostasis was achieved with cautery.  I again closed this wound with 3-0 Vicryl and 4-0 Monocryl on the skin. Skin glue was then applied to both incisions.  The patient tolerated the procedure well.  All the counts were correct at the end of the procedure.  The patient was then extubated in operating room and taken in a stable condition to recovery room.     Coralie Keens, M.D.     DB/MEDQ  D:  08/07/2015  T:  08/07/2015  Job:  [id number]

## 2015-08-08 ENCOUNTER — Encounter (HOSPITAL_COMMUNITY): Payer: Self-pay | Admitting: Surgery

## 2015-12-09 DIAGNOSIS — D352 Benign neoplasm of pituitary gland: Secondary | ICD-10-CM | POA: Diagnosis not present

## 2015-12-09 DIAGNOSIS — Z01419 Encounter for gynecological examination (general) (routine) without abnormal findings: Secondary | ICD-10-CM | POA: Diagnosis not present

## 2015-12-09 DIAGNOSIS — Z6841 Body Mass Index (BMI) 40.0 and over, adult: Secondary | ICD-10-CM | POA: Diagnosis not present

## 2016-01-15 ENCOUNTER — Encounter: Payer: Self-pay | Admitting: Cardiology

## 2016-01-21 ENCOUNTER — Encounter: Payer: Self-pay | Admitting: Cardiology

## 2016-01-21 ENCOUNTER — Ambulatory Visit (INDEPENDENT_AMBULATORY_CARE_PROVIDER_SITE_OTHER): Payer: BLUE CROSS/BLUE SHIELD | Admitting: Cardiology

## 2016-01-21 VITALS — BP 122/96 | HR 70 | Ht 69.0 in | Wt 280.8 lb

## 2016-01-21 DIAGNOSIS — R0609 Other forms of dyspnea: Secondary | ICD-10-CM | POA: Insufficient documentation

## 2016-01-21 DIAGNOSIS — R0600 Dyspnea, unspecified: Secondary | ICD-10-CM

## 2016-01-21 DIAGNOSIS — R5383 Other fatigue: Secondary | ICD-10-CM

## 2016-01-21 DIAGNOSIS — I10 Essential (primary) hypertension: Secondary | ICD-10-CM | POA: Diagnosis not present

## 2016-01-21 MED ORDER — HYDROCHLOROTHIAZIDE 25 MG PO TABS: 25.0000 mg | ORAL_TABLET | Freq: Every day | ORAL | Status: DC

## 2016-01-21 NOTE — Progress Notes (Signed)
Cardiology Office Note    Date:  01/21/2016   ID:  Miranda Marquez, DOB [birth year], MRN [id number]  PCP:  [facility]  Cardiologist:   Ena Dawley, MD  Referring physician: Vania Rea, MD  Chief complain: Fatigue dyspnea on exertion   History of Present Illness:  Miranda Marquez is a 55 y.o. female , this is a very pleasant patient with prior medical history of pituitary micro-adenoma resulting in 5 miscarriages, however she has 4 kids, status post hysterectomy, hyperlipidemia on diet only the patient states that for about a year she has noticed that she has no energy and gets short of breath with minimal exertion. This is a patient who in the past for [place]. 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 She went through a long period of time when she was either pregnant having a newborn, was accessing gain a lot of weight. Year ago she had hysterectomy after which they had heart time waking her up. She has noticed that since then she has just felt she has no energy and minimal activity causes her a lot of effort. She denies any chest pain, no palpitations or syncope. She has also noticed lower extremity edema but no orthopnea or paroxysmal nocturnal dyspnea. She states that she has never really been sick until now. She was referred to [place] for abnormal EKG.  Labs from 11/05/2015 showed LDL 141, triglycerides 117, HDL 62, electrolytes normal creatinine normal, LFTs normal, hemoglobin 14.4, normal TSH 0.9, normal free T4 and free T3, very low vitamin D that she is taking supplements for.  In her family her mother had heart problems however still alive at age of 62. She has a very distant history of smoking at college.  Past Medical History  Diagnosis Date  . Pituitary microadenoma with hyperprolactinemia ([place])   . Arthritis   . Psoriasis both elbows, scalp, left thigh, saw dr Allyson Sabal, last week given cream for areas healing  .  Complication of anesthesia     hypotension  in april after hysterectomy  . Hypertension     not taking meds   . Gestational diabetes     history     . Cervical herniated disc     Past Surgical History  Procedure Laterality Date  . Appendectomy    . Mini-laparotomy w/ tubal ligation    . Robotic assisted total hysterectomy with bilateral salpingo oopherectomy Bilateral 12/03/2014    Procedure: ROBOTIC ASSISTED TOTAL HYSTERECTOMY WITH BILATERAL SALPINGO OOPHORECTOMY;  Surgeon: Everitt Amber, MD;  Location: [facility];  Service: Gynecology;  Laterality: Bilateral;  . Foot surgery      left heel    . Breast lumpectomy with radioactive seed localization Right 08/07/2015    Procedure: RIGHT BREAST LUMPECTOMY WITH RADIOACTIVE SEED LOCALIZATION AND EXCISION OF BREAST MASS X2;  Surgeon: Coralie Keens, MD;  Location: [facility];  Service: General;  Laterality: Right;    Current Medications: Outpatient Prescriptions Prior to Visit  Medication Sig Dispense Refill  . bromocriptine (PARLODEL) 2.5 MG tablet Take 5 mg by mouth every morning.   2  . HYDROcodone-acetaminophen (NORCO) 5-325 MG tablet Take 1-2 tablets by mouth every 4 (four) hours as needed for moderate pain. 30 tablet 0  . ibuprofen (ADVIL,MOTRIN) 200 MG tablet Take 200 mg by mouth as needed.     No facility-administered medications prior to visit.    Allergies:   Review of patient's allergies indicates no known allergies.  Social History   Social History  . Marital Status: Married    Spouse Name: N/A  . Number of Children: N/A  . Years of Education: N/A   Social History Main Topics  . Smoking status: Former Smoker    Quit date: 08/23/1978  . Smokeless tobacco: Never Used  . Alcohol Use: 1.2 oz/week    2 Glasses of wine per week     Comment: occasional, weekends  . Drug Use: No  . Sexual Activity: Yes   Other Topics Concern  . None   Social History Narrative    Family History:  The patient's family history includes  Hypertension in her father.   ROS:   Please see the history of present illness.    ROS All other systems reviewed and are negative.   PHYSICAL EXAM:   VS:  BP 122/96 mmHg  Pulse 70  Ht 5\' 9"  (1.753 m)  Wt 280 lb 12.8 oz (127.37 kg)  BMI 41.45 kg/m2   GEN: Well nourished, well developed, in no acute distress HEENT: normal Neck: no JVD, carotid bruits, or masses Cardiac: RRR; no murmurs, rubs, or gallops, mild bilateral edema around the ankles. Respiratory:  clear to auscultation bilaterally, normal work of breathing GI: soft, nontender, nondistended, + BS MS: no deformity or atrophy Skin: warm and dry, no rash Neuro:  Alert and Oriented x 3, Strength and sensation are intact Psych: euthymic mood, full affect  Wt Readings from Last 3 Encounters:  01/21/16 280 lb 12.8 oz (127.37 kg)  08/07/15 287 lb 1.6 oz (130.228 kg)  08/06/15 287 lb 1.6 oz (130.228 kg)      Studies/Labs Reviewed:   EKG:  EKG is ordered today.  The ekg ordered today demonstrates Normal sinus rhythm right bundle branch block, this is unchanged from prior EKG in April 2016.  Recent Labs: 08/06/2015: Hemoglobin 14.1; Platelets 241   Lipid Panel No results found for: CHOL, TRIG, HDL, CHOLHDL, VLDL, LDLCALC, LDLDIRECT  Additional studies/ records that were reviewed today include:  Records from primary care physician as described in history of present illness.  Echocardiogram: None in the past.    ASSESSMENT:    1. Essential hypertension   2. Hypertension, essential, benign   3. Other fatigue   4. DOE (dyspnea on exertion)     PLAN:  In order of problems listed above:  1. Fatigue and dyspnea on exertion - patient's EKG shows sinus rhythm and right bundle branch block is unchanged from last year, we'll schedule an echocardiogram to evaluate for systolic and diastolic function, and also schedule a nuclear stress test to evaluate for possible ischemia. 2. His borderline hypertension and lower  extremity edema, I would start a low-dose hydrochlorothiazide 25 mg daily. 3. If her test results come back normal I would recommend that the next step would be to visit endocrinologist to evaluate other possible causes for her profound fatigue especially with prior history of pituitary microadenoma. We would refer her to Dr. Dorris Fetch. 4. For her right hip pain shooting down her leg she is advised to visit orthopedic surgeon to evaluate for any possible problem with her hip is not well referred to physical therapy ad lib. hour.   Medication Adjustments/Labs and Tests Ordered: Current medicines are reviewed at length with the patient today.  Concerns regarding medicines are outlined above.  Medication changes, Labs and Tests ordered today are listed in the Patient Instructions below. There are no Patient Instructions on file for this visit.   Signed,  Ena Dawley, MD  01/21/2016 4:21 PM    [facility] [place], [place], [place]  [address] Phone: [phone]; Fax: [phone]

## 2016-01-21 NOTE — Patient Instructions (Signed)
Medication Instructions:   START TAKING HYDROCHLOROTHIAZIDE 25 MG ONCE DAILY    Testing/Procedures:  Your physician has requested that you have an echocardiogram. Echocardiography is a painless test that uses sound waves to create images of your heart. It provides your doctor with information about the size and shape of your heart and how well your heart's chambers and valves are working. This procedure takes approximately one hour. There are no restrictions for this procedure.   Your physician has requested that you have a lexiscan myoview. For further information please visit [email or web address]. Please follow instruction sheet, as given.    Follow-Up:  ONE MONTH---June WITH DR NELSON---DR NELSON HAS ADDED SOME DAYS AT THE END OF June--PLEASE REFER TO THIS---HAVE TESTING DONE PRIOR TOO       If you need a refill on your cardiac medications before your next appointment, please call your pharmacy.

## 2016-02-11 ENCOUNTER — Other Ambulatory Visit (HOSPITAL_COMMUNITY): Payer: BLUE CROSS/BLUE SHIELD

## 2016-02-11 ENCOUNTER — Ambulatory Visit (HOSPITAL_COMMUNITY): Payer: BLUE CROSS/BLUE SHIELD

## 2016-02-12 ENCOUNTER — Ambulatory Visit (HOSPITAL_COMMUNITY): Payer: BLUE CROSS/BLUE SHIELD

## 2016-02-16 DIAGNOSIS — M545 Low back pain: Secondary | ICD-10-CM | POA: Diagnosis not present

## 2016-02-16 DIAGNOSIS — M25551 Pain in right hip: Secondary | ICD-10-CM | POA: Diagnosis not present

## 2016-02-17 ENCOUNTER — Ambulatory Visit: Payer: BLUE CROSS/BLUE SHIELD | Admitting: Cardiology

## 2016-03-02 DIAGNOSIS — M5441 Lumbago with sciatica, right side: Secondary | ICD-10-CM | POA: Diagnosis not present

## 2016-03-15 ENCOUNTER — Other Ambulatory Visit (HOSPITAL_COMMUNITY): Payer: BLUE CROSS/BLUE SHIELD

## 2016-03-15 ENCOUNTER — Ambulatory Visit (HOSPITAL_COMMUNITY): Payer: BLUE CROSS/BLUE SHIELD

## 2016-03-16 ENCOUNTER — Ambulatory Visit (HOSPITAL_COMMUNITY): Payer: BLUE CROSS/BLUE SHIELD

## 2016-03-16 DIAGNOSIS — M5441 Lumbago with sciatica, right side: Secondary | ICD-10-CM | POA: Diagnosis not present

## 2016-03-16 DIAGNOSIS — G8929 Other chronic pain: Secondary | ICD-10-CM | POA: Diagnosis not present

## 2016-03-23 DIAGNOSIS — G8929 Other chronic pain: Secondary | ICD-10-CM | POA: Diagnosis not present

## 2016-03-23 DIAGNOSIS — M5441 Lumbago with sciatica, right side: Secondary | ICD-10-CM | POA: Diagnosis not present

## 2016-04-16 DIAGNOSIS — M4316 Spondylolisthesis, lumbar region: Secondary | ICD-10-CM | POA: Diagnosis not present

## 2016-04-16 DIAGNOSIS — M5136 Other intervertebral disc degeneration, lumbar region: Secondary | ICD-10-CM | POA: Diagnosis not present

## 2016-04-16 DIAGNOSIS — M5416 Radiculopathy, lumbar region: Secondary | ICD-10-CM | POA: Diagnosis not present

## 2016-04-16 DIAGNOSIS — M5117 Intervertebral disc disorders with radiculopathy, lumbosacral region: Secondary | ICD-10-CM | POA: Diagnosis not present

## 2016-04-27 ENCOUNTER — Telehealth (HOSPITAL_COMMUNITY): Payer: Self-pay | Admitting: *Deleted

## 2016-04-27 ENCOUNTER — Telehealth: Payer: Self-pay | Admitting: Cardiology

## 2016-04-27 NOTE — Telephone Encounter (Signed)
Left message on voicemail in reference to upcoming appointment scheduled for 04/28/16 Phone number given for a call back so details instructions can be given. Miranda Marquez

## 2016-04-27 NOTE — Telephone Encounter (Signed)
Follow Up:     Returning your call,please call today if possible.

## 2016-04-28 ENCOUNTER — Other Ambulatory Visit (HOSPITAL_COMMUNITY): Payer: BLUE CROSS/BLUE SHIELD

## 2016-04-28 ENCOUNTER — Ambulatory Visit (HOSPITAL_COMMUNITY): Payer: BLUE CROSS/BLUE SHIELD

## 2016-04-29 ENCOUNTER — Ambulatory Visit (HOSPITAL_COMMUNITY): Payer: BLUE CROSS/BLUE SHIELD

## 2016-04-29 DIAGNOSIS — M5441 Lumbago with sciatica, right side: Secondary | ICD-10-CM | POA: Diagnosis not present

## 2016-05-11 ENCOUNTER — Telehealth (HOSPITAL_COMMUNITY): Payer: Self-pay | Admitting: *Deleted

## 2016-05-11 NOTE — Telephone Encounter (Signed)
Left message on voicemail in reference to upcoming appointment scheduled for  05/12/16. Phone number given for a call back so details instructions can be given. Kirstie Peri, RN

## 2016-05-12 ENCOUNTER — Ambulatory Visit (HOSPITAL_BASED_OUTPATIENT_CLINIC_OR_DEPARTMENT_OTHER): Payer: BLUE CROSS/BLUE SHIELD

## 2016-05-12 ENCOUNTER — Other Ambulatory Visit: Payer: Self-pay

## 2016-05-12 ENCOUNTER — Ambulatory Visit (HOSPITAL_COMMUNITY): Payer: BLUE CROSS/BLUE SHIELD | Attending: Cardiology

## 2016-05-12 DIAGNOSIS — R0600 Dyspnea, unspecified: Secondary | ICD-10-CM

## 2016-05-12 DIAGNOSIS — I10 Essential (primary) hypertension: Secondary | ICD-10-CM

## 2016-05-12 DIAGNOSIS — I4510 Unspecified right bundle-branch block: Secondary | ICD-10-CM | POA: Diagnosis not present

## 2016-05-12 DIAGNOSIS — R5383 Other fatigue: Secondary | ICD-10-CM | POA: Diagnosis not present

## 2016-05-12 DIAGNOSIS — R0609 Other forms of dyspnea: Secondary | ICD-10-CM

## 2016-05-12 DIAGNOSIS — E785 Hyperlipidemia, unspecified: Secondary | ICD-10-CM | POA: Insufficient documentation

## 2016-05-12 DIAGNOSIS — Z87891 Personal history of nicotine dependence: Secondary | ICD-10-CM | POA: Insufficient documentation

## 2016-05-12 DIAGNOSIS — I77810 Thoracic aortic ectasia: Secondary | ICD-10-CM | POA: Insufficient documentation

## 2016-05-12 DIAGNOSIS — I119 Hypertensive heart disease without heart failure: Secondary | ICD-10-CM | POA: Insufficient documentation

## 2016-05-12 LAB — ECHOCARDIOGRAM COMPLETE
Height: 69 in
Weight: 4480 oz

## 2016-05-12 MED ORDER — REGADENOSON 0.4 MG/5ML IV SOLN
0.4000 mg | Freq: Once | INTRAVENOUS | Status: AC
  Administered 2016-05-12: 0.4 mg via INTRAVENOUS

## 2016-05-12 MED ORDER — TECHNETIUM TC 99M TETROFOSMIN IV KIT
32.5000 | PACK | Freq: Once | INTRAVENOUS | Status: AC | PRN
  Administered 2016-05-12: 33 via INTRAVENOUS
  Filled 2016-05-12: qty 33

## 2016-05-13 ENCOUNTER — Ambulatory Visit (HOSPITAL_COMMUNITY): Payer: BLUE CROSS/BLUE SHIELD

## 2016-05-14 ENCOUNTER — Ambulatory Visit (HOSPITAL_COMMUNITY): Payer: BLUE CROSS/BLUE SHIELD | Attending: Cardiology

## 2016-05-14 ENCOUNTER — Encounter (INDEPENDENT_AMBULATORY_CARE_PROVIDER_SITE_OTHER): Payer: Self-pay

## 2016-05-14 LAB — MYOCARDIAL PERFUSION IMAGING
LV dias vol: 128 mL (ref 46–106)
LV sys vol: 59 mL
Peak HR: 90 {beats}/min
RATE: 0.28
Rest HR: 77 {beats}/min
SDS: 3
SRS: 3
SSS: 6
TID: 1.06

## 2016-05-14 MED ORDER — TECHNETIUM TC 99M TETROFOSMIN IV KIT
32.0000 | PACK | Freq: Once | INTRAVENOUS | Status: AC | PRN
  Administered 2016-05-14: 32 via INTRAVENOUS
  Filled 2016-05-14: qty 32

## 2016-05-20 ENCOUNTER — Ambulatory Visit: Payer: BLUE CROSS/BLUE SHIELD | Admitting: Cardiology

## 2016-07-27 ENCOUNTER — Other Ambulatory Visit: Payer: Self-pay | Admitting: Neurological Surgery

## 2016-07-27 DIAGNOSIS — M4316 Spondylolisthesis, lumbar region: Secondary | ICD-10-CM | POA: Diagnosis not present

## 2016-08-18 ENCOUNTER — Inpatient Hospital Stay (HOSPITAL_COMMUNITY): Admission: RE | Admit: 2016-08-18 | Payer: BLUE CROSS/BLUE SHIELD | Source: Ambulatory Visit

## 2016-09-02 ENCOUNTER — Encounter (HOSPITAL_COMMUNITY)
Admission: RE | Admit: 2016-09-02 | Discharge: 2016-09-02 | Disposition: A | Discharge status: Home / Self Care | Payer: BLUE CROSS/BLUE SHIELD | Source: Ambulatory Visit | Attending: Neurological Surgery | Admitting: Neurological Surgery

## 2016-09-02 ENCOUNTER — Encounter (HOSPITAL_COMMUNITY): Payer: Self-pay

## 2016-09-02 ENCOUNTER — Ambulatory Visit (HOSPITAL_COMMUNITY)
Admission: RE | Admit: 2016-09-02 | Discharge: 2016-09-02 | Disposition: A | Discharge status: Home / Self Care | Payer: BLUE CROSS/BLUE SHIELD | Source: Ambulatory Visit | Attending: Neurological Surgery | Admitting: Neurological Surgery

## 2016-09-02 DIAGNOSIS — M4310 Spondylolisthesis, site unspecified: Secondary | ICD-10-CM

## 2016-09-02 DIAGNOSIS — Z01818 Encounter for other preprocedural examination: Secondary | ICD-10-CM | POA: Diagnosis not present

## 2016-09-02 HISTORY — DX: Sleep apnea, unspecified: G47.30

## 2016-09-02 LAB — BASIC METABOLIC PANEL
Anion gap: 9 (ref 5–15)
BUN: 14 mg/dL (ref 6–20)
CHLORIDE: 103 mmol/L (ref 101–111)
CO2: 25 mmol/L (ref 22–32)
CREATININE: 0.74 mg/dL (ref 0.44–1.00)
Calcium: 9.3 mg/dL (ref 8.9–10.3)
GFR calc Af Amer: >60 mL/min (ref >60)
GFR calc non Af Amer: >60 mL/min (ref >60)
Glucose, Bld: 118 mg/dL — ABNORMAL HIGH (ref 65–99)
POTASSIUM: 3.5 mmol/L (ref 3.5–5.1)
SODIUM: 137 mmol/L (ref 135–145)

## 2016-09-02 LAB — TYPE AND SCREEN
ABO/RH(D): O POS
Antibody Screen: NEGATIVE

## 2016-09-02 LAB — CBC WITH DIFFERENTIAL/PLATELET
Basophils Absolute: 0 10*3/uL (ref 0.0–0.1)
Basophils Relative: 1 %
EOS PCT: 3 %
Eosinophils Absolute: 0.2 10*3/uL (ref 0.0–0.7)
HCT: 41.7 % (ref 36.0–46.0)
Hemoglobin: 14.1 g/dL (ref 12.0–15.0)
LYMPHS ABS: 1.7 10*3/uL (ref 0.7–4.0)
Lymphocytes Relative: 35 %
MCH: 29.4 pg (ref 26.0–34.0)
MCHC: 33.8 g/dL (ref 30.0–36.0)
MCV: 87.1 fL (ref 78.0–100.0)
MONO ABS: 0.3 10*3/uL (ref 0.1–1.0)
MONOS PCT: 6 %
Neutro Abs: 2.8 10*3/uL (ref 1.7–7.7)
Neutrophils Relative %: 55 %
PLATELETS: 244 10*3/uL (ref 150–400)
RBC: 4.79 MIL/uL (ref 3.87–5.11)
RDW: 13.8 % (ref 11.5–15.5)
WBC: 5 10*3/uL (ref 4.0–10.5)

## 2016-09-02 LAB — SURGICAL PCR SCREEN
MRSA, PCR: NEGATIVE
Staphylococcus aureus: POSITIVE — AB

## 2016-09-02 LAB — ABO/RH: ABO/RH(D): O POS

## 2016-09-02 LAB — PROTIME-INR
INR: 1.01
PROTHROMBIN TIME: 13.3 s (ref 11.4–15.2)

## 2016-09-02 NOTE — Progress Notes (Addendum)
Patient states that with 2 surgical procedures, she got hypotensive.  First time was 26 yrs ago during childbirth, and the 2nd time with hysterectomy.   She's has subsequent surgeries without any issues. She sees Dr. Stann Mainland @ [facility] as her PCP.  She has been to see them at [facility] -- LOV 08/2015 States that the stress & echo results were negative.  Denies any cardiac issues now, nor has she seen a cardiologist. Even tho she has never been tested for OSA, she believes she has it.

## 2016-09-02 NOTE — Progress Notes (Signed)
   09/02/16 1536  OBSTRUCTIVE SLEEP APNEA  Have you ever been diagnosed with sleep apnea through a sleep study? No  Do you snore loudly (loud enough to be heard through closed doors)?  1  Do you often feel tired, fatigued, or sleepy during the daytime (such as falling asleep during driving or talking to someone)? 1  Has anyone observed you stop breathing during your sleep? 0  Do you have, or are you being treated for high blood pressure? 1  BMI more than 35 kg/m2? 1  Age > 50 (1-yes) 1  Neck circumference greater than:Female 16 inches or larger, Female 17inches or larger? 1  Female Gender (Yes=1) 0  Obstructive Sleep Apnea Score 6

## 2016-09-02 NOTE — Pre-Procedure Instructions (Signed)
    DAIJA CAMM  09/02/2016      [id number] [place], [place] - [address] [place] [address] Phone: [phone] Fax: [phone]    Your procedure is scheduled on January 18th, Thursday.   Report to [facility] Admitting at 7:30 AM             (posted surgery time 9:45 am - 12:50 pm)   Call this number if you have problems the MORNING of surgery:  [phone]   Remember:  Do not eat food or drink liquids after midnight Wednesday.   Take these medicines the morning of surgery with A SIP OF WATER : Hydrocodone              (4-5 days prior to surgery, STOP TAKING any Vitamins, Herbal Supplements, Anti-inflammatories)   Do not wear jewelry, make-up or nail polish.  Do not wear lotions, powders,perfumes, or deoderant.   Do not shave underarms & legs 48 hours prior to surgery.    Do not bring valuables to the hospital.  [facility] is not responsible for any belongings or valuables.  Contacts, dentures or bridgework may not be worn into surgery.  Leave your suitcase in the car.  After surgery it may be brought to your room.  For patients admitted to the hospital, discharge time will be determined by your treatment team.  Please read over the following fact sheets that you were given. Pain Booklet, MRSA Information and Surgical Site Infection Prevention

## 2016-09-03 NOTE — Progress Notes (Signed)
I [facility] [place], [place].

## 2016-09-08 MED ORDER — DEXTROSE 5 % IV SOLN
3.0000 g | INTRAVENOUS | Status: AC
  Administered 2016-09-09: 3 g via INTRAVENOUS
  Filled 2016-09-08: qty 3000

## 2016-09-09 ENCOUNTER — Inpatient Hospital Stay (HOSPITAL_COMMUNITY): Payer: BLUE CROSS/BLUE SHIELD | Admitting: Certified Registered Nurse Anesthetist

## 2016-09-09 ENCOUNTER — Encounter (HOSPITAL_COMMUNITY): Payer: Self-pay | Admitting: *Deleted

## 2016-09-09 ENCOUNTER — Inpatient Hospital Stay (HOSPITAL_COMMUNITY)
Admission: RE | Admit: 2016-09-09 | Discharge: 2016-09-10 | DRG: 455 | Disposition: A | Discharge status: Home / Self Care | Payer: BLUE CROSS/BLUE SHIELD | Source: Ambulatory Visit | Attending: Neurological Surgery | Admitting: Neurological Surgery

## 2016-09-09 ENCOUNTER — Encounter (HOSPITAL_COMMUNITY)
Admission: RE | Disposition: A | Discharge status: Home / Self Care | Payer: Self-pay | Source: Ambulatory Visit | Attending: Neurological Surgery

## 2016-09-09 ENCOUNTER — Inpatient Hospital Stay (HOSPITAL_COMMUNITY): Payer: BLUE CROSS/BLUE SHIELD

## 2016-09-09 DIAGNOSIS — M5020 Other cervical disc displacement, unspecified cervical region: Secondary | ICD-10-CM | POA: Diagnosis not present

## 2016-09-09 DIAGNOSIS — R938 Abnormal findings on diagnostic imaging of other specified body structures: Secondary | ICD-10-CM | POA: Diagnosis not present

## 2016-09-09 DIAGNOSIS — Z87891 Personal history of nicotine dependence: Secondary | ICD-10-CM | POA: Diagnosis not present

## 2016-09-09 DIAGNOSIS — M4316 Spondylolisthesis, lumbar region: Secondary | ICD-10-CM | POA: Diagnosis not present

## 2016-09-09 DIAGNOSIS — G4730 Sleep apnea, unspecified: Secondary | ICD-10-CM | POA: Diagnosis present

## 2016-09-09 DIAGNOSIS — R0600 Dyspnea, unspecified: Secondary | ICD-10-CM | POA: Diagnosis not present

## 2016-09-09 DIAGNOSIS — Z981 Arthrodesis status: Secondary | ICD-10-CM

## 2016-09-09 DIAGNOSIS — L409 Psoriasis, unspecified: Secondary | ICD-10-CM | POA: Diagnosis not present

## 2016-09-09 DIAGNOSIS — I10 Essential (primary) hypertension: Secondary | ICD-10-CM | POA: Diagnosis present

## 2016-09-09 DIAGNOSIS — Z419 Encounter for procedure for purposes other than remedying health state, unspecified: Secondary | ICD-10-CM

## 2016-09-09 DIAGNOSIS — Z79891 Long term (current) use of opiate analgesic: Secondary | ICD-10-CM

## 2016-09-09 DIAGNOSIS — Z79899 Other long term (current) drug therapy: Secondary | ICD-10-CM

## 2016-09-09 HISTORY — PX: MAXIMUM ACCESS (MAS)POSTERIOR LUMBAR INTERBODY FUSION (PLIF) 1 LEVEL: SHX6368

## 2016-09-09 SURGERY — FOR MAXIMUM ACCESS (MAS) POSTERIOR LUMBAR INTERBODY FUSION (PLIF) 1 LEVEL
Anesthesia: General | Site: Back

## 2016-09-09 MED ORDER — BROMOCRIPTINE MESYLATE 2.5 MG PO TABS
5.0000 mg | ORAL_TABLET | Freq: Every morning | ORAL | Status: DC
  Administered 2016-09-10: 5 mg via ORAL
  Filled 2016-09-09: qty 2

## 2016-09-09 MED ORDER — ARTIFICIAL TEARS OP OINT
TOPICAL_OINTMENT | OPHTHALMIC | Status: DC | PRN
  Administered 2016-09-09: 1 via OPHTHALMIC

## 2016-09-09 MED ORDER — ACETAMINOPHEN 650 MG RE SUPP: 650.0000 mg | RECTAL | Status: DC | PRN

## 2016-09-09 MED ORDER — VANCOMYCIN HCL 1000 MG IV SOLR
INTRAVENOUS | Status: DC | PRN
  Administered 2016-09-09: 1000 mg

## 2016-09-09 MED ORDER — MORPHINE SULFATE (PF) 4 MG/ML IV SOLN: 1.0000 mg | INTRAVENOUS | Status: DC | PRN

## 2016-09-09 MED ORDER — ONDANSETRON HCL 4 MG/2ML IJ SOLN
INTRAMUSCULAR | Status: DC | PRN
  Administered 2016-09-09: 4 mg via INTRAVENOUS

## 2016-09-09 MED ORDER — LIDOCAINE 2% (20 MG/ML) 5 ML SYRINGE
INTRAMUSCULAR | Status: AC
  Filled 2016-09-09: qty 5

## 2016-09-09 MED ORDER — VANCOMYCIN HCL 1000 MG IV SOLR
INTRAVENOUS | Status: AC
  Filled 2016-09-09: qty 1000

## 2016-09-09 MED ORDER — THROMBIN 20000 UNITS EX SOLR
CUTANEOUS | Status: DC | PRN
  Administered 2016-09-09: 10:00:00 via TOPICAL

## 2016-09-09 MED ORDER — THROMBIN 20000 UNITS EX SOLR
CUTANEOUS | Status: AC
  Filled 2016-09-09: qty 20000

## 2016-09-09 MED ORDER — PROPOFOL 500 MG/50ML IV EMUL
INTRAVENOUS | Status: DC | PRN
  Administered 2016-09-09: 50 ug/kg/min via INTRAVENOUS

## 2016-09-09 MED ORDER — LIDOCAINE 2% (20 MG/ML) 5 ML SYRINGE
INTRAMUSCULAR | Status: DC | PRN
Start: 2016-09-09 — End: 2016-09-09
  Administered 2016-09-09: 80 mg via INTRAVENOUS

## 2016-09-09 MED ORDER — POTASSIUM CHLORIDE IN NACL 20-0.9 MEQ/L-% IV SOLN: INTRAVENOUS | Status: DC

## 2016-09-09 MED ORDER — OXYCODONE HCL 5 MG/5ML PO SOLN: 5.0000 mg | Freq: Once | ORAL | Status: AC | PRN

## 2016-09-09 MED ORDER — SUGAMMADEX SODIUM 200 MG/2ML IV SOLN
INTRAVENOUS | Status: DC | PRN
  Administered 2016-09-09: 270 mg via INTRAVENOUS

## 2016-09-09 MED ORDER — SUCCINYLCHOLINE CHLORIDE 200 MG/10ML IV SOSY
PREFILLED_SYRINGE | INTRAVENOUS | Status: AC
  Filled 2016-09-09: qty 10

## 2016-09-09 MED ORDER — EPHEDRINE 5 MG/ML INJ
INTRAVENOUS | Status: AC
  Filled 2016-09-09: qty 10

## 2016-09-09 MED ORDER — DEXAMETHASONE SODIUM PHOSPHATE 10 MG/ML IJ SOLN
10.0000 mg | INTRAMUSCULAR | Status: AC
  Administered 2016-09-09: 10 mg via INTRAVENOUS

## 2016-09-09 MED ORDER — PROPOFOL 10 MG/ML IV BOLUS
INTRAVENOUS | Status: AC
  Filled 2016-09-09: qty 40

## 2016-09-09 MED ORDER — PHENYLEPHRINE HCL 10 MG/ML IJ SOLN
INTRAVENOUS | Status: DC | PRN
  Administered 2016-09-09: 40 ug/min via INTRAVENOUS

## 2016-09-09 MED ORDER — PHENOL 1.4 % MT LIQD: 1.0000 | OROMUCOSAL | Status: DC | PRN

## 2016-09-09 MED ORDER — ROCURONIUM BROMIDE 50 MG/5ML IV SOSY
PREFILLED_SYRINGE | INTRAVENOUS | Status: AC
  Filled 2016-09-09: qty 10

## 2016-09-09 MED ORDER — CELECOXIB 200 MG PO CAPS
200.0000 mg | ORAL_CAPSULE | Freq: Two times a day (BID) | ORAL | Status: DC
  Administered 2016-09-09 – 2016-09-10 (×2): 200 mg via ORAL
  Filled 2016-09-09 (×2): qty 1

## 2016-09-09 MED ORDER — FENTANYL CITRATE (PF) 100 MCG/2ML IJ SOLN
INTRAMUSCULAR | Status: AC
  Filled 2016-09-09: qty 4

## 2016-09-09 MED ORDER — PROPOFOL 10 MG/ML IV BOLUS
INTRAVENOUS | Status: DC | PRN
  Administered 2016-09-09: 130 mg via INTRAVENOUS

## 2016-09-09 MED ORDER — FENTANYL CITRATE (PF) 100 MCG/2ML IJ SOLN
25.0000 ug | INTRAMUSCULAR | Status: DC | PRN
  Administered 2016-09-09 (×4): 25 ug via INTRAVENOUS

## 2016-09-09 MED ORDER — MENTHOL 3 MG MT LOZG
1.0000 | LOZENGE | OROMUCOSAL | Status: DC | PRN
  Administered 2016-09-10: 3 mg via ORAL
  Filled 2016-09-09 (×2): qty 9

## 2016-09-09 MED ORDER — FENTANYL CITRATE (PF) 100 MCG/2ML IJ SOLN
INTRAMUSCULAR | Status: AC
  Filled 2016-09-09: qty 2

## 2016-09-09 MED ORDER — METHOCARBAMOL 500 MG PO TABS
500.0000 mg | ORAL_TABLET | Freq: Four times a day (QID) | ORAL | Status: DC | PRN
  Administered 2016-09-09 – 2016-09-10 (×3): 500 mg via ORAL
  Filled 2016-09-09 (×2): qty 1

## 2016-09-09 MED ORDER — BUPIVACAINE HCL (PF) 0.25 % IJ SOLN
INTRAMUSCULAR | Status: AC
  Filled 2016-09-09: qty 30

## 2016-09-09 MED ORDER — 0.9 % SODIUM CHLORIDE (POUR BTL) OPTIME
TOPICAL | Status: DC | PRN
Start: 2016-09-09 — End: 2016-09-09
  Administered 2016-09-09: 1000 mL

## 2016-09-09 MED ORDER — ONDANSETRON HCL 4 MG/2ML IJ SOLN: 4.0000 mg | INTRAMUSCULAR | Status: DC | PRN

## 2016-09-09 MED ORDER — CHLORHEXIDINE GLUCONATE CLOTH 2 % EX PADS: 6.0000 | MEDICATED_PAD | Freq: Once | CUTANEOUS | Status: DC

## 2016-09-09 MED ORDER — HYDROCHLOROTHIAZIDE 25 MG PO TABS
25.0000 mg | ORAL_TABLET | Freq: Every day | ORAL | Status: DC
  Administered 2016-09-10: 25 mg via ORAL
  Filled 2016-09-09: qty 1

## 2016-09-09 MED ORDER — MIDAZOLAM HCL 2 MG/2ML IJ SOLN
INTRAMUSCULAR | Status: AC
  Filled 2016-09-09: qty 2

## 2016-09-09 MED ORDER — MIDAZOLAM HCL 2 MG/2ML IJ SOLN
INTRAMUSCULAR | Status: DC | PRN
  Administered 2016-09-09: 2 mg via INTRAVENOUS

## 2016-09-09 MED ORDER — SODIUM CHLORIDE 0.9% FLUSH
3.0000 mL | Freq: Two times a day (BID) | INTRAVENOUS | Status: DC
  Administered 2016-09-09: 3 mL via INTRAVENOUS

## 2016-09-09 MED ORDER — OXYCODONE HCL 5 MG PO TABS
ORAL_TABLET | ORAL | Status: AC
  Filled 2016-09-09: qty 1

## 2016-09-09 MED ORDER — ACETAMINOPHEN 325 MG PO TABS: 650.0000 mg | ORAL_TABLET | ORAL | Status: DC | PRN

## 2016-09-09 MED ORDER — SUCCINYLCHOLINE CHLORIDE 200 MG/10ML IV SOSY
PREFILLED_SYRINGE | INTRAVENOUS | Status: DC | PRN
  Administered 2016-09-09: 100 mg via INTRAVENOUS

## 2016-09-09 MED ORDER — DEXAMETHASONE SODIUM PHOSPHATE 10 MG/ML IJ SOLN
INTRAMUSCULAR | Status: AC
  Filled 2016-09-09: qty 1

## 2016-09-09 MED ORDER — METHOCARBAMOL 500 MG PO TABS
ORAL_TABLET | ORAL | Status: AC
  Filled 2016-09-09: qty 1

## 2016-09-09 MED ORDER — SODIUM CHLORIDE 0.9 % IR SOLN
Status: DC | PRN
  Administered 2016-09-09: 10:00:00

## 2016-09-09 MED ORDER — PHENYLEPHRINE 40 MCG/ML (10ML) SYRINGE FOR IV PUSH (FOR BLOOD PRESSURE SUPPORT)
PREFILLED_SYRINGE | INTRAVENOUS | Status: AC
  Filled 2016-09-09: qty 10

## 2016-09-09 MED ORDER — THROMBIN 5000 UNITS EX SOLR
OROMUCOSAL | Status: DC | PRN
  Administered 2016-09-09: 10:00:00 via TOPICAL

## 2016-09-09 MED ORDER — OXYCODONE HCL 5 MG PO TABS
5.0000 mg | ORAL_TABLET | Freq: Once | ORAL | Status: AC | PRN
  Administered 2016-09-09: 5 mg via ORAL

## 2016-09-09 MED ORDER — SUGAMMADEX SODIUM 200 MG/2ML IV SOLN
INTRAVENOUS | Status: AC
  Filled 2016-09-09: qty 4

## 2016-09-09 MED ORDER — LACTATED RINGERS IV SOLN
INTRAVENOUS | Status: DC
  Administered 2016-09-09 (×3): via INTRAVENOUS

## 2016-09-09 MED ORDER — SODIUM CHLORIDE 0.9% FLUSH: 3.0000 mL | INTRAVENOUS | Status: DC | PRN

## 2016-09-09 MED ORDER — FENTANYL CITRATE (PF) 100 MCG/2ML IJ SOLN
INTRAMUSCULAR | Status: DC | PRN
  Administered 2016-09-09 (×4): 50 ug via INTRAVENOUS
  Administered 2016-09-09 (×2): 100 ug via INTRAVENOUS

## 2016-09-09 MED ORDER — ROCURONIUM BROMIDE 100 MG/10ML IV SOLN
INTRAVENOUS | Status: DC | PRN
  Administered 2016-09-09: 50 mg via INTRAVENOUS
  Administered 2016-09-09: 10 mg via INTRAVENOUS
  Administered 2016-09-09: 20 mg via INTRAVENOUS
  Administered 2016-09-09 (×2): 10 mg via INTRAVENOUS

## 2016-09-09 MED ORDER — OXYCODONE-ACETAMINOPHEN 5-325 MG PO TABS
1.0000 | ORAL_TABLET | ORAL | Status: DC | PRN
  Administered 2016-09-09 – 2016-09-10 (×4): 2 via ORAL
  Filled 2016-09-09 (×4): qty 2

## 2016-09-09 MED ORDER — SENNA 8.6 MG PO TABS
1.0000 | ORAL_TABLET | Freq: Two times a day (BID) | ORAL | Status: DC
  Administered 2016-09-09 – 2016-09-10 (×2): 8.6 mg via ORAL
  Filled 2016-09-09 (×2): qty 1

## 2016-09-09 MED ORDER — METHOCARBAMOL 1000 MG/10ML IJ SOLN: 500.0000 mg | Freq: Four times a day (QID) | INTRAMUSCULAR | Status: DC | PRN

## 2016-09-09 MED ORDER — CEFAZOLIN IN D5W 1 GM/50ML IV SOLN
1.0000 g | Freq: Three times a day (TID) | INTRAVENOUS | Status: AC
  Administered 2016-09-09 – 2016-09-10 (×2): 1 g via INTRAVENOUS
  Filled 2016-09-09 (×2): qty 50

## 2016-09-09 MED ORDER — THROMBIN 5000 UNITS EX SOLR
CUTANEOUS | Status: AC
  Filled 2016-09-09: qty 5000

## 2016-09-09 SURGICAL SUPPLY — 67 items
ADH SKN CLS APL DERMABOND .7 (GAUZE/BANDAGES/DRESSINGS) ×1
APL SKNCLS STERI-STRIP NONHPOA (GAUZE/BANDAGES/DRESSINGS) ×1
BAG DECANTER FOR FLEXI CONT (MISCELLANEOUS) ×2 IMPLANT
BASKET BONE COLLECTION (BASKET) ×3 IMPLANT
BENZOIN TINCTURE PRP APPL 2/3 (GAUZE/BANDAGES/DRESSINGS) ×2 IMPLANT
BLADE CLIPPER SURG (BLADE) IMPLANT
BUR MATCHSTICK NEURO 3.0 LAGG (BURR) ×2 IMPLANT
CAGE COROENT LRG 8X9X28M SPINE (Cage) ×2 IMPLANT
CANISTER SUCT 3000ML PPV (MISCELLANEOUS) ×2 IMPLANT
CARTRIDGE OIL MAESTRO DRILL (MISCELLANEOUS) ×1 IMPLANT
CLIP NEUROVISION LG (CLIP) ×1 IMPLANT
CONT SPEC 4OZ CLIKSEAL STRL BL (MISCELLANEOUS) ×2 IMPLANT
COVER BACK TABLE 24X17X13 BIG (DRAPES) IMPLANT
COVER BACK TABLE 60X90IN (DRAPES) ×2 IMPLANT
DERMABOND ADVANCED (GAUZE/BANDAGES/DRESSINGS) ×1
DERMABOND ADVANCED .7 DNX12 (GAUZE/BANDAGES/DRESSINGS) IMPLANT
DIFFUSER DRILL AIR PNEUMATIC (MISCELLANEOUS) ×2 IMPLANT
DRAPE C-ARM 42X72 X-RAY (DRAPES) ×2 IMPLANT
DRAPE C-ARMOR (DRAPES) ×2 IMPLANT
DRAPE LAPAROTOMY 100X72X124 (DRAPES) ×2 IMPLANT
DRAPE POUCH INSTRU U-SHP 10X18 (DRAPES) ×2 IMPLANT
DRAPE SURG 17X23 STRL (DRAPES) ×2 IMPLANT
DRSG OPSITE POSTOP 4X6 (GAUZE/BANDAGES/DRESSINGS) ×1 IMPLANT
DURAPREP 26ML APPLICATOR (WOUND CARE) ×2 IMPLANT
ELECT BLADE 4.0 EZ CLEAN MEGAD (MISCELLANEOUS) ×2
ELECT REM PT RETURN 9FT ADLT (ELECTROSURGICAL) ×2
ELECTRODE BLDE 4.0 EZ CLN MEGD (MISCELLANEOUS) IMPLANT
ELECTRODE REM PT RTRN 9FT ADLT (ELECTROSURGICAL) ×1 IMPLANT
EVACUATOR 1/8 PVC DRAIN (DRAIN) ×1 IMPLANT
GAUZE SPONGE 4X4 16PLY XRAY LF (GAUZE/BANDAGES/DRESSINGS) IMPLANT
GLOVE BIO SURGEON STRL SZ8 (GLOVE) ×4 IMPLANT
GLOVE INDICATOR 7.5 STRL GRN (GLOVE) ×2 IMPLANT
GLOVE SS N UNI LF 6.5 STRL (GLOVE) ×3 IMPLANT
GOWN STRL REUS W/ TWL LRG LVL3 (GOWN DISPOSABLE) IMPLANT
GOWN STRL REUS W/ TWL XL LVL3 (GOWN DISPOSABLE) ×2 IMPLANT
GOWN STRL REUS W/TWL 2XL LVL3 (GOWN DISPOSABLE) IMPLANT
GOWN STRL REUS W/TWL LRG LVL3 (GOWN DISPOSABLE) ×4
GOWN STRL REUS W/TWL XL LVL3 (GOWN DISPOSABLE) ×4
HEMOSTAT POWDER KIT SURGIFOAM (HEMOSTASIS) ×1 IMPLANT
KIT BASIN OR (CUSTOM PROCEDURE TRAY) ×2 IMPLANT
KIT NDL NVM5 EMG ELECT (KITS) IMPLANT
KIT NEEDLE NVM5 EMG ELECT (KITS) ×1 IMPLANT
KIT NEEDLE NVM5 EMG ELECTRODE (KITS) ×1
KIT ROOM TURNOVER OR (KITS) ×2 IMPLANT
MILL MEDIUM DISP (BLADE) ×1 IMPLANT
NDL HYPO 25X1 1.5 SAFETY (NEEDLE) ×1 IMPLANT
NEEDLE HYPO 25X1 1.5 SAFETY (NEEDLE) ×2 IMPLANT
NS IRRIG 1000ML POUR BTL (IV SOLUTION) ×2 IMPLANT
OIL CARTRIDGE MAESTRO DRILL (MISCELLANEOUS) ×2
PACK LAMINECTOMY NEURO (CUSTOM PROCEDURE TRAY) ×2 IMPLANT
PAD ARMBOARD 7.5X6 YLW CONV (MISCELLANEOUS) ×6 IMPLANT
PUTTY BONE ATTRAX 5CC STRIP (Putty) ×1 IMPLANT
ROD RELINE COCR LORD 5.0X35 (Rod) ×2 IMPLANT
SCREW LOCK RSS 4.5/5.0MM (Screw) ×4 IMPLANT
SCREW POLY RMM 5.5X40 4S (Screw) ×2 IMPLANT
SCREW RELINE RMM 5.5X45 4S (Screw) ×2 IMPLANT
SPONGE LAP 4X18 X RAY DECT (DISPOSABLE) IMPLANT
SPONGE SURGIFOAM ABS GEL 100 (HEMOSTASIS) ×2 IMPLANT
STRIP CLOSURE SKIN 1/2X4 (GAUZE/BANDAGES/DRESSINGS) ×3 IMPLANT
SUT VIC AB 0 CT1 18XCR BRD8 (SUTURE) ×1 IMPLANT
SUT VIC AB 0 CT1 8-18 (SUTURE) ×2
SUT VIC AB 2-0 CP2 18 (SUTURE) ×2 IMPLANT
SUT VIC AB 3-0 SH 8-18 (SUTURE) ×4 IMPLANT
TOWEL OR 17X24 6PK STRL BLUE (TOWEL DISPOSABLE) ×2 IMPLANT
TOWEL OR 17X26 10 PK STRL BLUE (TOWEL DISPOSABLE) ×2 IMPLANT
TRAY FOLEY W/METER SILVER 16FR (SET/KITS/TRAYS/PACK) ×2 IMPLANT
WATER STERILE IRR 1000ML POUR (IV SOLUTION) ×2 IMPLANT

## 2016-09-09 NOTE — OR Nursing (Signed)
Nuvasive electrodes placed after induction to upper body and lower extremity for nerve monitoring

## 2016-09-09 NOTE — Op Note (Signed)
09/09/2016  1:18 PM  PATIENT:  Miranda Marquez  56 y.o. female  PRE-OPERATIVE DIAGNOSIS:  Lytic spondylolisthesis L3-4 with back and leg pain  POST-OPERATIVE DIAGNOSIS:  same  PROCEDURE:   1. Decompressive lumbar laminectomy L3-4 (Gill type) requiring more work than would be required for a simple exposure of the disk for PLIF in order to adequately decompress the neural elements and address the spinal stenosis 2. Posterior lumbar interbody fusion L3-4 using PEEK interbody cages packed with morcellized allograft and autograft 3. Posterior fixation L3-4 using cortical pedicle screws.  4. Intertransverse arthrodesis L3-4 using morcellized autograft and allograft.  SURGEON:  Sherley Bounds, MD  ASSISTANTS: Dr. Christella Noa  ANESTHESIA:  General  EBL: 300 ml  Total I/O In: 1000 [I.V.:1000] Out: 500 [Urine:200; Blood:300]  BLOOD ADMINISTERED:none  DRAINS: none   INDICATION FOR PROCEDURE: This patient presented with back and leg pain. Imaging revealed lytic spondylolisthesis L3-4. The patient tried a reasonable attempt at conservative medical measures without relief. I recommended decompression and instrumented fusion to address the stenosis as well as the segment once stability.  Patient understood the risks, benefits, and alternatives and potential outcomes and wished to proceed.  PROCEDURE DETAILS:  The patient was brought to the operating room. After induction of generalized endotracheal anesthesia the patient was rolled into the prone position on chest rolls and all pressure points were padded. The patient's lumbar region was cleaned and then prepped with DuraPrep and draped in the usual sterile fashion. Anesthesia was injected and then a dorsal midline incision was made and carried down to the lumbosacral fascia. The fascia was opened and the paraspinous musculature was taken down in a subperiosteal fashion to expose L3-4. A self-retaining retractor was placed. Intraoperative fluoroscopy  confirmed my level, and I started with placement of the L3 cortical pedicle screws. The pedicle screw entry zones were identified utilizing surface landmarks and  AP and lateral fluoroscopy. I scored the cortex with the high-speed drill and then used the hand drill and EMG monitoring to drill an upward and outward direction into the pedicle. I then tapped line to line, and the tap was also monitored. I then placed a 5.5 x 45 mm cortical pedicle screw into the pedicles of L3 bilaterally. I then turned my attention to the decompression and spinous process was removed complete lumbar laminectomies, hemi- facetectomies, and foraminotomies were performed at L3-4 using a Gill-type decompression. The patient had significant spinal stenosis and this required more work than would be required for a simple exposure of the disc for posterior lumbar interbody fusion. Much more generous decompression was undertaken in order to adequately decompress the neural elements and address the patient's leg pain. The patient had severe compression of the L3 nerve roots . The yellow ligament was removed to expose the underlying dura and nerve roots, and generous foraminotomies were performed to adequately decompress the neural elements. Both the exiting and traversing nerve roots were decompressed on both sides until a coronary dilator passed easily along the nerve roots. Once the decompression was complete, I turned my attention to the posterior lower lumbar interbody fusion. The epidural venous vasculature was coagulated and cut sharply. Disc space was incised and the initial discectomy was performed with pituitary rongeurs. The disc space was distracted with sequential distractors to a height of millimeters. We then used a series of scrapers and shavers to prepare the endplates for fusion. The midline was prepared with Epstein curettes. Once the complete discectomy was finished, we packed an appropriate sized  peek interbody cage with  local autograft and morcellized allograft, gently retracted the nerve root, and tapped the cage into position at L3-4.  The midline between the cages was packed with morselized autograft and allograft. We then turned our attention to the placement of the lower pedicle screws. The pedicle screw entry zones were identified utilizing surface landmarks and fluoroscopy. I drilled into each pedicle utilizing the hand drill and EMG monitoring, and tapped each pedicle with the appropriate tap. We palpated with a ball probe to assure no break in the cortex. We then placed a 0.5 x 40 mm cortical pedicle screws into the pedicles bilaterally at L4. We then decorticated the transverse processes and laid a mixture of morcellized autograft and allograft out over these to perform intertransverse arthrodesis at L3-4. We then placed lordotic rods into the multiaxial screw heads of the pedicle screws and locked these in position with the locking caps and anti-torque device. We then checked our construct with AP and lateral fluoroscopy. Irrigated with copious amounts of bacitracin-containing saline solution. Inspected the nerve roots once again to assure adequate decompression, lined to the dura with Gelfoam, and closed the muscle and the fascia with 0 Vicryl. Closed the subcutaneous tissues with 2-0 Vicryl and subcuticular tissues with 3-0 Vicryl. The skin was closed with benzoin and Steri-Strips. Dressing was then applied, the patient was awakened from general anesthesia and transported to the recovery room in stable condition. At the end of the procedure all sponge, needle and instrument counts were correct.   PLAN OF CARE: admit to inpatient  PATIENT DISPOSITION:  PACU - hemodynamically stable.   Delay start of Pharmacological VTE agent (>24hrs) due to surgical blood loss or risk of bleeding:  yes

## 2016-09-09 NOTE — Anesthesia Preprocedure Evaluation (Signed)
Anesthesia Evaluation  Patient identified by MRN, date of birth, ID band Patient awake    Reviewed: Allergy & Precautions, NPO status , Patient's Chart, lab work & pertinent test results  History of Anesthesia Complications Negative for: history of anesthetic complications  Airway Mallampati: II  TM Distance: >3 FB Neck ROM: Full    Dental  (+) Teeth Intact   Pulmonary sleep apnea , former smoker,    breath sounds clear to auscultation       Cardiovascular hypertension, Pt. on medications + DOE   Rhythm:Regular     Neuro/Psych negative neurological ROS  negative psych ROS   GI/Hepatic negative GI ROS, Neg liver ROS,   Endo/Other  Morbid obesity  Renal/GU negative Renal ROS     Musculoskeletal  (+) Arthritis ,   Abdominal   Peds  Hematology   Anesthesia Other Findings   Reproductive/Obstetrics                             Anesthesia Physical Anesthesia Plan  ASA: III  Anesthesia Plan: General   Post-op Pain Management:    Induction: Intravenous  Airway Management Planned: Oral ETT  Additional Equipment: None  Intra-op Plan:   Post-operative Plan: Extubation in OR  Informed Consent: I have reviewed the patients History and Physical, chart, labs and discussed the procedure including the risks, benefits and alternatives for the proposed anesthesia with the patient or authorized representative who has indicated his/her understanding and acceptance.   Dental advisory given  Plan Discussed with: CRNA and Surgeon  Anesthesia Plan Comments:         Anesthesia Quick Evaluation

## 2016-09-09 NOTE — Progress Notes (Signed)
Lunch relief by S. Gregson RN 

## 2016-09-09 NOTE — H&P (Signed)
Subjective: Patient is a 56 y.o. female admitted for PLIF. Onset of symptoms was several months ago, gradually worsening since that time.  The pain is rated severe, and is located at the across the lower back and radiates to legs. The pain is described as aching and occurs all day. The symptoms have been progressive. Symptoms are exacerbated by exercise. MRI or CT showed lytic spondy with stenosis L3-4   Past Medical History:  Diagnosis Date  . Arthritis   . Cervical herniated disc   . Complication of anesthesia    hypotension  in april after hysterectomy  . Gestational diabetes    history..back 21 plus yrs ago.  Now she is ok  . Hypertension    not taking meds   . Pituitary microadenoma with hyperprolactinemia ([place])   . Psoriasis both elbows, scalp, left thigh, saw dr Allyson Sabal, last week given cream for areas healing  . Sleep apnea    never had study done, but things she does have it    Past Surgical History:  Procedure Laterality Date  . ABDOMINAL HYSTERECTOMY    . APPENDECTOMY    . BREAST LUMPECTOMY WITH RADIOACTIVE SEED LOCALIZATION Right 08/07/2015   Procedure: RIGHT BREAST LUMPECTOMY WITH RADIOACTIVE SEED LOCALIZATION AND EXCISION OF BREAST MASS X2;  Surgeon: Coralie Keens, MD;  Location: [facility];  Service: General;  Laterality: Right;  . BREAST SURGERY    . DILATION AND CURETTAGE OF UTERUS    . FOOT SURGERY     left heel    . HERNIA REPAIR     umbilical  . MINI-LAPAROTOMY W/ TUBAL LIGATION    . ROBOTIC ASSISTED TOTAL HYSTERECTOMY WITH BILATERAL SALPINGO OOPHERECTOMY Bilateral 12/03/2014   Procedure: ROBOTIC ASSISTED TOTAL HYSTERECTOMY WITH BILATERAL SALPINGO OOPHORECTOMY;  Surgeon: Everitt Amber, MD;  Location: [facility];  Service: Gynecology;  Laterality: Bilateral;  . TUBAL LIGATION      Prior to Admission medications   Medication Sig Start Date End Date Taking? Authorizing Provider  bromocriptine (PARLODEL) 2.5 MG tablet Take 5 mg by mouth every morning.  10/08/14  Yes  Historical Provider, MD  hydrochlorothiazide (HYDRODIURIL) 25 MG tablet Take 1 tablet (25 mg total) by mouth daily. 01/21/16  Yes Dorothy Spark, MD  OVER THE COUNTER MEDICATION [place] [facility]- bought from [place].   Yes Historical Provider, MD  HYDROcodone-acetaminophen (NORCO) 5-325 MG tablet Take 1-2 tablets by mouth every 4 (four) hours as needed for moderate pain. 08/07/15   Coralie Keens, MD  ibuprofen (ADVIL,MOTRIN) 200 MG tablet Take 200 mg by mouth as needed.    Historical Provider, MD  meloxicam (MOBIC) 7.5 MG tablet Take 7.5 mg by mouth daily.    Historical Provider, MD   No Known Allergies  Social History  Substance Use Topics  . Smoking status: Former Smoker    Quit date: 08/23/1978  . Smokeless tobacco: Never Used  . Alcohol use 1.2 oz/week    2 Glasses of wine per week     Comment: occasional, weekends    Family History  Problem Relation Age of Onset  . Hypertension Father      Review of Systems  Positive ROS: neg  All other systems have been reviewed and were otherwise negative with the exception of those mentioned in the HPI and as above.  Objective: Vital signs in last 24 hours: Temp:  [98.8 F (37.1 C)] 98.8 F (37.1 C) (01/18 0759) Pulse Rate:  [87] 87 (01/18 0759) Resp:  [20] 20 (01/18 0759) BP: (151)/(65) 151/65 (  01/18 0759) SpO2:  [97 %] 97 % (01/18 0759) Weight:  [133.3 kg (293 lb 12.8 oz)] 133.3 kg (293 lb 12.8 oz) (01/18 0759)  General Appearance: Alert, cooperative, no distress, appears stated age Head: Normocephalic, without obvious abnormality, atraumatic Eyes: PERRL, conjunctiva/corneas clear, EOM's intact    Neck: Supple, symmetrical, trachea midline Back: Symmetric, no curvature, ROM normal, no CVA tenderness Lungs:  respirations unlabored Heart: Regular rate and rhythm Abdomen: Soft, non-tender Extremities: Extremities normal, atraumatic, no cyanosis or edema Pulses: 2+ and symmetric all extremities Skin: Skin color, texture, turgor  normal, no rashes or lesions  NEUROLOGIC:   Mental status: Alert and oriented x4,  no aphasia, good attention span, fund of knowledge, and memory Motor Exam - grossly normal Sensory Exam - grossly normal Reflexes: 1+ Coordination - grossly normal Gait - grossly normal Balance - grossly normal Cranial Nerves: I: smell Not tested  II: visual acuity  OS: nl    OD: nl  II: visual fields Full to confrontation  II: pupils Equal, round, reactive to light  III,VII: ptosis None  III,IV,VI: extraocular muscles  Full ROM  V: mastication Normal  V: facial light touch sensation  Normal  V,VII: corneal reflex  Present  VII: facial muscle function - upper  Normal  VII: facial muscle function - lower Normal  VIII: hearing Not tested  IX: soft palate elevation  Normal  IX,X: gag reflex Present  XI: trapezius strength  5/5  XI: sternocleidomastoid strength 5/5  XI: neck flexion strength  5/5  XII: tongue strength  Normal    Data Review Lab Results  Component Value Date   WBC 5.0 09/02/2016   HGB 14.1 09/02/2016   HCT 41.7 09/02/2016   MCV 87.1 09/02/2016   PLT 244 09/02/2016   Lab Results  Component Value Date   NA 137 09/02/2016   K 3.5 09/02/2016   CL 103 09/02/2016   CO2 25 09/02/2016   BUN 14 09/02/2016   CREATININE 0.74 09/02/2016   GLUCOSE 118 (H) 09/02/2016   Lab Results  Component Value Date   INR 1.01 09/02/2016    Assessment/Plan: Patient admitted for PLIF L3-4. Patient has failed a reasonable attempt at conservative therapy.  I explained the condition and procedure to the patient and answered any questions.  Patient wishes to proceed with procedure as planned. Understands risks/ benefits and typical outcomes of procedure.   Miranda Marquez S 09/09/2016 9:32 AM

## 2016-09-09 NOTE — Transfer of Care (Signed)
Immediate Anesthesia Transfer of Care Note  Patient: Miranda Marquez  Procedure(s) Performed: Procedure(s): MAXIMUM ACCESS SURGERY POSTERIOR LUMBAR INTERBODY FUSION LUMBAR THREE-FOUR (N/A)  Patient Location: PACU  Anesthesia Type:General  Level of Consciousness: awake, alert  and oriented  Airway & Oxygen Therapy: Patient Spontanous Breathing and Patient connected to nasal cannula oxygen  Post-op Assessment: Report given to RN, Post -op Vital signs reviewed and stable and Patient moving all extremities  Post vital signs: Reviewed and stable  Last Vitals:  Vitals:   09/09/16 0759  BP: (!) 151/65  Pulse: 87  Resp: 20  Temp: 37.1 C    Last Pain:  Vitals:   09/09/16 0826  TempSrc:   PainSc: 2          Complications: No apparent anesthesia complications

## 2016-09-09 NOTE — OR Nursing (Signed)
Gold wedding band removed per short stay nurse Pam stated would give to patient husband

## 2016-09-09 NOTE — Anesthesia Procedure Notes (Signed)
Procedure Name: Intubation Date/Time: 09/09/2016 10:04 AM Performed by: Trixie Deis A Pre-anesthesia Checklist: Patient identified, Emergency Drugs available, Suction available and Patient being monitored Patient Re-evaluated:Patient Re-evaluated prior to inductionOxygen Delivery Method: Circle System Utilized Preoxygenation: Pre-oxygenation with 100% oxygen Intubation Type: IV induction Ventilation: Mask ventilation without difficulty Laryngoscope Size: Mac and 4 Grade View: Grade I Tube type: Oral Tube size: 7.0 mm Number of attempts: 1 Airway Equipment and Method: Stylet and Oral airway Placement Confirmation: ETT inserted through vocal cords under direct vision,  positive ETCO2 and breath sounds checked- equal and bilateral Secured at: 21 cm Tube secured with: Tape Dental Injury: Teeth and Oropharynx as per pre-operative assessment

## 2016-09-10 ENCOUNTER — Encounter (HOSPITAL_COMMUNITY): Payer: Self-pay | Admitting: Neurological Surgery

## 2016-09-10 MED ORDER — HYDROCODONE-ACETAMINOPHEN 5-325 MG PO TABS: 1.0000 | ORAL_TABLET | ORAL | 0 refills | Status: DC | PRN

## 2016-09-10 MED ORDER — METHOCARBAMOL 500 MG PO TABS: 500.0000 mg | ORAL_TABLET | Freq: Four times a day (QID) | ORAL | 1 refills | Status: DC | PRN

## 2016-09-10 NOTE — Evaluation (Signed)
Occupational Therapy Evaluation Patient Details Name: Miranda Marquez MRN: [id number] DOB: 04-11-1961 Today's Date: 09/10/2016    History of Present Illness Pt is a 56 y/o female who presents s/p L3-L4 PLIF on 09/09/16.  Past Medical History:  Diagnosis Date  . Arthritis   . Cervical herniated disc   . Complication of anesthesia    hypotension  in april after hysterectomy  . Gestational diabetes    history..back 21 plus yrs ago.  Now she is ok  . Hypertension    not taking meds   . Pituitary microadenoma with hyperprolactinemia ([place])   . Psoriasis both elbows, scalp, left thigh, saw dr Allyson Sabal, last week given cream for areas healing  . Sleep apnea    never had study done, but things she does have it      Clinical Impression   Patient evaluated by Occupational Therapy with no further acute OT needs identified. All education has been completed and the patient has no further questions. See below for any follow-up Occupational Therapy or equipment needs. OT to sign off. Thank you for referral.      Follow Up Recommendations  No OT follow up    Equipment Recommendations  None recommended by OT    Recommendations for Other Services       Precautions / Restrictions Precautions Precautions: Fall;Back Precaution Booklet Issued: Yes (comment) Precaution Comments: handout provided and reviewed in detail Required Braces or Orthoses: Spinal Brace Spinal Brace: Lumbar corset;Applied in sitting position Restrictions Weight Bearing Restrictions: No      Mobility Bed Mobility Overal bed mobility: Modified Independent             General bed mobility comments: Pt received exiting the bathroom  Transfers Overall transfer level: Modified independent Equipment used: None             General transfer comment: No assist required. No unsteadiness noted. Increased time initially due to pain however was able to complete later in session without difficulty.     Balance  Overall balance assessment: No apparent balance deficits (not formally assessed)                                          ADL Overall ADL's : Modified independent                                       General ADL Comments: pt able to demonstrate bed mobility, toilet transfer and sink level grooming. pt is unable to reach feet but reports never donning socks. pt able to loop pants with elastic waist band. Educated on Secondary school teacher for LOB dressing Back handout provided and reviewed adls in detail. Pt educated on: clothing between brace, never sleep in brace, set an alarm at night for medication, avoid sitting for long periods of time, correct bed positioning for sleeping, correct sequence for bed mobility, avoiding lifting more than 5 pounds and never wash directly over incision. All education is complete and patient indicates understanding.Pt able to don brace MOD I       Vision     Perception     Praxis      Pertinent Vitals/Pain Pain Assessment: 0-10 Pain Score: 4  Pain Location: R hip, Incision site.  Pain Descriptors / Indicators: Operative site guarding;Cramping Pain Intervention(s):  Monitored during session;Premedicated before session;Repositioned     Hand Dominance Right   Extremity/Trunk Assessment Upper Extremity Assessment Upper Extremity Assessment: Overall WFL for tasks assessed   Lower Extremity Assessment Lower Extremity Assessment: Defer to PT evaluation RLE Deficits / Details: Decreased strength consistent with pre-op diagnosis.    Cervical / Trunk Assessment Cervical / Trunk Assessment: Other exceptions Cervical / Trunk Exceptions: s/p surgery   Communication Communication Communication: No difficulties   Cognition Arousal/Alertness: Awake/alert Behavior During Therapy: WFL for tasks assessed/performed Overall Cognitive Status: Within Functional Limits for tasks assessed                     General Comments        Exercises       Shoulder Instructions      Home Living Family/patient expects to be discharged to:: Private residence Living Arrangements: Spouse/significant other;Children Available Help at Discharge: Family;Available 24 hours/day Type of Home: House Home Access: Stairs to enter [employer] of Steps: 1   Home Layout: Two level;Able to live on main level with bedroom/bathroom Alternate Level Stairs-Number of Steps: Flight   Bathroom Shower/Tub: Teacher, early years/pre: Standard Bathroom Accessibility: Yes   Home Equipment: Shower seat;Crutches          Prior Functioning/Environment Level of Independence: Independent                 OT Problem List:     OT Treatment/Interventions:      OT Goals(Current goals can be found in the care plan section) Acute Rehab OT Goals Patient Stated Goal: Home today  OT Frequency:     Barriers to D/C:            Co-evaluation              End of Session Equipment Utilized During Treatment: Back brace Nurse Communication: Mobility status;Precautions  Activity Tolerance: Patient tolerated treatment well Patient left: in chair;with call bell/phone within reach   Time: 0829-0852 OT Time Calculation (min): 23 min Charges:  OT General Charges $OT Visit: 1 Procedure OT Evaluation $OT Eval Moderate Complexity: 1 Procedure G-Codes:    Parke Poisson B 10-03-16, 11:03 AM   Jeri Modena   OTR/L PagerIP:[phone] Office: [phone] .

## 2016-09-10 NOTE — Evaluation (Signed)
Physical Therapy Evaluation and Discharge Patient Details Name: Miranda Marquez MRN: [id number] DOB: [birth year] Today's Date: 09/10/2016   History of Present Illness  Pt is a 56 y/o female who presents s/p L3-L4 PLIF on 09/09/16.  Clinical Impression  Patient evaluated by Physical Therapy with no further acute PT needs identified. All education has been completed and the patient has no further questions. At the time of PT eval pt was able to perform transfers and ambulation at a modified independent level. Initially, pt very painful and had difficulty maneuvering around room without UE support, however after a short time walking in hall, pain reportedly improved and pt was able to confidently ambulate without UE support. Pt was educated on walking program at home until MD clears for outpatient PT. See below for any follow-up Physical Therapy or equipment needs. PT is signing off. Thank you for this referral.     Follow Up Recommendations Outpatient PT    Equipment Recommendations  None recommended by PT    Recommendations for Other Services       Precautions / Restrictions Precautions Precautions: Fall;Back Precaution Booklet Issued: Yes (comment) Precaution Comments: Reviewed handout, and pt was cued for precautions during functional mobility. Required Braces or Orthoses: Spinal Brace Spinal Brace: Lumbar corset;Applied in sitting position Restrictions Weight Bearing Restrictions: No      Mobility  Bed Mobility               General bed mobility comments: Pt received exiting the bathroom  Transfers Overall transfer level: Modified independent Equipment used: None             General transfer comment: No assist required. No unsteadiness noted. Increased time initially due to pain however was able to complete later in session without difficulty.   Ambulation/Gait Ambulation/Gait assistance: Modified independent (Device/Increase time) Ambulation Distance (Feet): 400  Feet Assistive device: None Gait Pattern/deviations: Step-through pattern;Decreased stride length Gait velocity: Decreased Gait velocity interpretation: Below normal speed for age/gender General Gait Details: VC's for improved posture. Slow and guarded initially due to pain but was able to improve gait speed as she walked and pain subsided.   Stairs Stairs:  (Pt declined stair training)          Wheelchair Mobility    Modified Rankin (Stroke Patients Only)       Balance Overall balance assessment: No apparent balance deficits (not formally assessed)                                           Pertinent Vitals/Pain Pain Assessment: 0-10 Pain Score: 4  Pain Location: R hip, Incision site.  Pain Descriptors / Indicators: Operative site guarding;Cramping Pain Intervention(s): Monitored during session;Limited activity within patient's tolerance;Repositioned    Home Living Family/patient expects to be discharged to:: Private residence Living Arrangements: Spouse/significant other;Children Available Help at Discharge: Family;Available 24 hours/day Type of Home: House Home Access: Stairs to enter   [employer] of Steps: 1 Home Layout: Two level;Able to live on main level with bedroom/bathroom Home Equipment: Shower seat;Crutches      Prior Function Level of Independence: Independent               Hand Dominance   Dominant Hand: Right    Extremity/Trunk Assessment   Upper Extremity Assessment Upper Extremity Assessment: Overall WFL for tasks assessed    Lower Extremity Assessment Lower Extremity Assessment:  RLE deficits/detail RLE Deficits / Details: Decreased strength consistent with pre-op diagnosis.     Cervical / Trunk Assessment Cervical / Trunk Assessment: Other exceptions Cervical / Trunk Exceptions: s/p surgery  Communication   Communication: No difficulties  Cognition Arousal/Alertness: Awake/alert Behavior During  Therapy: WFL for tasks assessed/performed Overall Cognitive Status: Within Functional Limits for tasks assessed                      General Comments      Exercises     Assessment/Plan    PT Assessment Patent does not need any further PT services  PT Problem List            PT Treatment Interventions      PT Goals (Current goals can be found in the Care Plan section)  Acute Rehab PT Goals Patient Stated Goal: Home today PT Goal Formulation: All assessment and education complete, DC therapy    Frequency     Barriers to discharge        Co-evaluation               End of Session Equipment Utilized During Treatment: Back brace Activity Tolerance: Patient tolerated treatment well Patient left: in chair;with call bell/phone within reach Nurse Communication: Mobility status         Time: [id number] PT Time Calculation (min) (ACUTE ONLY): 23 min   Charges:   PT Evaluation $PT Eval Moderate Complexity: 1 Procedure PT Treatments $Gait Training: 8-22 mins   PT G Codes:        Thelma Comp Sep 16, 2016, 10:16 AM  Rolinda Roan, PT, DPT Acute Rehabilitation Services Pager: [phone]

## 2016-09-10 NOTE — Discharge Summary (Signed)
Physician Discharge Summary  Patient ID: Miranda Marquez MRN: [id number] DOB/AGE: 27-Jun-1961 56 y.o.  Admit date: 09/09/2016 Discharge date: 09/10/2016  Admission Diagnoses: lytic spondy L3-4    Discharge Diagnoses: same   Discharged Condition: good  Hospital Course: The patient was admitted on 09/09/2016 and taken to the operating room where the patient underwent PLIF L3-4. The patient tolerated the procedure well and was taken to the recovery room and then to the floor in stable condition. The hospital course was routine. There were no complications. The wound remained clean dry and intact. Pt had appropriate back soreness. No complaints of leg pain or new N/T/W. The patient remained afebrile with stable vital signs, and tolerated a regular diet. The patient continued to increase activities, and pain was well controlled with oral pain medications.   Consults: None  Significant Diagnostic Studies:  Results for orders placed or performed during the hospital encounter of 09/02/16  Surgical pcr screen  Result Value Ref Range   MRSA, PCR NEGATIVE NEGATIVE   Staphylococcus aureus POSITIVE (A) NEGATIVE  Basic metabolic panel  Result Value Ref Range   Sodium 137 135 - 145 mmol/L   Potassium 3.5 3.5 - 5.1 mmol/L   Chloride 103 101 - 111 mmol/L   CO2 25 22 - 32 mmol/L   Glucose, Bld 118 (H) 65 - 99 mg/dL   BUN 14 6 - 20 mg/dL   Creatinine, Ser 0.74 0.44 - 1.00 mg/dL   Calcium 9.3 8.9 - 10.3 mg/dL   GFR calc non Af Amer >60 >60 mL/min   GFR calc Af Amer >60 >60 mL/min   Anion gap 9 5 - 15  CBC WITH DIFFERENTIAL  Result Value Ref Range   WBC 5.0 4.0 - 10.5 K/uL   RBC 4.79 3.87 - 5.11 MIL/uL   Hemoglobin 14.1 12.0 - 15.0 g/dL   HCT 41.7 36.0 - 46.0 %   MCV 87.1 78.0 - 100.0 fL   MCH 29.4 26.0 - 34.0 pg   MCHC 33.8 30.0 - 36.0 g/dL   RDW 13.8 11.5 - 15.5 %   Platelets 244 150 - 400 K/uL   Neutrophils Relative % 55 %   Neutro Abs 2.8 1.7 - 7.7 K/uL   Lymphocytes Relative 35 %   Lymphs Abs 1.7 0.7 - 4.0 K/uL   Monocytes Relative 6 %   Monocytes Absolute 0.3 0.1 - 1.0 K/uL   Eosinophils Relative 3 %   Eosinophils Absolute 0.2 0.0 - 0.7 K/uL   Basophils Relative 1 %   Basophils Absolute 0.0 0.0 - 0.1 K/uL  Protime-INR  Result Value Ref Range   Prothrombin Time 13.3 11.4 - 15.2 seconds   INR 1.01   Type and screen [place]  Result Value Ref Range   ABO/RH(D) O POS    Antibody Screen NEG    Sample Expiration 09/16/2016    Extend sample reason NO TRANSFUSIONS OR PREGNANCY IN THE PAST 3 MONTHS   ABO/Rh  Result Value Ref Range   ABO/RH(D) O POS     Chest 2 View  Result Date: 09/02/2016 CLINICAL DATA:  Preoperative examination prior to PLIF. History of sleep apnea, hypertension, breast malignancy treated with lumpectomy and radiation therapy. Former smoker. EXAM: CHEST  2 VIEW COMPARISON:  Limited views of the chest from a bilateral rib series of April 17, 2015. FINDINGS: The lungs are adequately inflated. The interstitial markings are coarse but not greatly changed from the previous study. There is no alveolar infiltrate. There  is no pleural effusion. The mediastinum is normal in width. There is mild multilevel degenerative disc disease of the thoracic spine. IMPRESSION: Mild chronic bronchitic changes. No pneumonia, CHF, nor other acute cardiopulmonary abnormality. Electronically Signed   By: Shealeigh Dunstan  [place] M.D.   On: 09/02/2016 15:44   Dg Lumbar Spine 2-3 Views  Result Date: 09/09/2016 CLINICAL DATA:  Status post posterior lumbar interbody fusion EXAM: LUMBAR SPINE - 2-3 VIEW; DG C-ARM 61-120 MIN COMPARISON:  None. FLUOROSCOPY TIME:  1 minutes 31 seconds FINDINGS: Posterior lumbar interbody fusion at L3-4. Persistent stable grade 1 anterolisthesis of L3 on L4. IMPRESSION: PLIF L3-4. Electronically Signed   By: Kathreen Devoid   On: 09/09/2016 13:17   Dg C-arm 1-60 Min  Result Date: 09/09/2016 CLINICAL DATA:  Status post posterior lumbar interbody  fusion EXAM: LUMBAR SPINE - 2-3 VIEW; DG C-ARM 61-120 MIN COMPARISON:  None. FLUOROSCOPY TIME:  1 minutes 31 seconds FINDINGS: Posterior lumbar interbody fusion at L3-4. Persistent stable grade 1 anterolisthesis of L3 on L4. IMPRESSION: PLIF L3-4. Electronically Signed   By: Kathreen Devoid   On: 09/09/2016 13:17    Antibiotics:  Anti-infectives    Start     Dose/Rate Route Frequency Ordered Stop   09/09/16 1700  ceFAZolin (ANCEF) IVPB 1 g/50 mL premix     1 g 100 mL/hr over 30 Minutes Intravenous Every 8 hours 09/09/16 1600 09/10/16 0038   09/09/16 1102  vancomycin (VANCOCIN) powder  Status:  Discontinued       As needed 09/09/16 1102 09/09/16 1322   09/09/16 0940  bacitracin 50,000 Units in sodium chloride irrigation 0.9 % 500 mL irrigation  Status:  Discontinued       As needed 09/09/16 1056 09/09/16 1322   09/09/16 0600  ceFAZolin (ANCEF) 3 g in dextrose 5 % 50 mL IVPB     3 g 130 mL/hr over 30 Minutes Intravenous On call to O.R. 09/08/16 1328 09/09/16 0948      Discharge Exam: Blood pressure 134/73, pulse 77, temperature 98.5 F (36.9 C), temperature source Oral, resp. rate 18, height 5\' 8"  (1.727 m), weight 133.3 kg (293 lb 12.8 oz), SpO2 98 %. Neurologic: Grossly normal Dressing dry  Discharge Medications:   Allergies as of 09/10/2016   No Known Allergies     Medication List    STOP taking these medications   ibuprofen 200 MG tablet Commonly known as:  ADVIL,MOTRIN   meloxicam 7.5 MG tablet Commonly known as:  MOBIC     TAKE these medications   bromocriptine 2.5 MG tablet Commonly known as:  PARLODEL Take 5 mg by mouth every morning.   hydrochlorothiazide 25 MG tablet Commonly known as:  HYDRODIURIL Take 1 tablet (25 mg total) by mouth daily.   HYDROcodone-acetaminophen 5-325 MG tablet Commonly known as:  NORCO Take 1-2 tablets by mouth every 4 (four) hours as needed for moderate pain.   methocarbamol 500 MG tablet Commonly known as:  ROBAXIN Take 1 tablet  (500 mg total) by mouth every 6 (six) hours as needed for muscle spasms.   OVER THE COUNTER MEDICATION Atribion- bought from [place].            Durable Medical Equipment        Start     Ordered   09/09/16 1601  DME Walker rolling  Once    Question:  Patient needs a walker to treat with the following condition  Answer:  S/P lumbar fusion   09/09/16 1600  09/09/16 1601  DME 3 n 1  Once     09/09/16 1600      Disposition: home   Final Dx: PLIF L3-4  Discharge Instructions     Remove dressing in 72 hours    Complete by:  As directed    Call MD for:  difficulty breathing, headache or visual disturbances    Complete by:  As directed    Call MD for:  persistant nausea and vomiting    Complete by:  As directed    Call MD for:  redness, tenderness, or signs of infection (pain, swelling, redness, odor or green/yellow discharge around incision site)    Complete by:  As directed    Call MD for:  severe uncontrolled pain    Complete by:  As directed    Call MD for:  temperature >100.4    Complete by:  As directed    Diet - low sodium heart healthy    Complete by:  As directed    Discharge instructions    Complete by:  As directed    No driving, no bending or twisting, may shower   Increase activity slowly    Complete by:  As directed          Signed: Yuna Pizzolato S 09/10/2016, 8:28 AM

## 2016-09-10 NOTE — Progress Notes (Signed)
Patient alert and oriented, mae's well, voiding adequate amount of urine, swallowing without difficulty, no c/o pain. Patient discharged home with family. Script and discharged instructions given to patient. Patient and family stated understanding of d/c instructions given and has an appointment with MD. 

## 2016-09-12 NOTE — Anesthesia Postprocedure Evaluation (Addendum)
Anesthesia Post Note  Patient: Miranda Marquez  Procedure(s) Performed: Procedure(s) (LRB): MAXIMUM ACCESS SURGERY POSTERIOR LUMBAR INTERBODY FUSION LUMBAR THREE-FOUR (N/A)  Patient location during evaluation: PACU Anesthesia Type: General Level of consciousness: awake and alert Pain management: pain level controlled Vital Signs Assessment: post-procedure vital signs reviewed and stable Respiratory status: spontaneous breathing, nonlabored ventilation, respiratory function stable and patient connected to nasal cannula oxygen Cardiovascular status: blood pressure returned to baseline and stable Postop Assessment: no signs of nausea or vomiting Anesthetic complications: no       Last Vitals:  Vitals:   09/10/16 0832 09/10/16 1100  BP: 107/75 105/60  Pulse: 81   Resp: 18 18  Temp: 37.1 C 36.8 C    Last Pain:  Vitals:   09/10/16 1253  TempSrc:   PainSc: 3                  Chanele Douglas

## 2016-09-21 ENCOUNTER — Other Ambulatory Visit: Payer: Self-pay | Admitting: Orthopedic Surgery

## 2016-09-21 DIAGNOSIS — M25551 Pain in right hip: Secondary | ICD-10-CM

## 2016-09-22 ENCOUNTER — Other Ambulatory Visit: Payer: Self-pay | Admitting: Neurological Surgery

## 2016-09-22 DIAGNOSIS — M25551 Pain in right hip: Secondary | ICD-10-CM

## 2016-10-01 ENCOUNTER — Ambulatory Visit
Admission: RE | Admit: 2016-10-01 | Discharge: 2016-10-01 | Disposition: A | Discharge status: Home / Self Care | Payer: BLUE CROSS/BLUE SHIELD | Source: Ambulatory Visit | Attending: Orthopedic Surgery | Admitting: Orthopedic Surgery

## 2016-10-01 DIAGNOSIS — M25551 Pain in right hip: Secondary | ICD-10-CM

## 2016-11-17 DIAGNOSIS — M1612 Unilateral primary osteoarthritis, left hip: Secondary | ICD-10-CM | POA: Diagnosis not present

## 2016-11-17 DIAGNOSIS — M25551 Pain in right hip: Secondary | ICD-10-CM | POA: Diagnosis not present

## 2016-11-30 ENCOUNTER — Other Ambulatory Visit: Payer: Self-pay | Admitting: Obstetrics & Gynecology

## 2016-11-30 DIAGNOSIS — Z1231 Encounter for screening mammogram for malignant neoplasm of breast: Secondary | ICD-10-CM

## 2016-11-30 DIAGNOSIS — M4316 Spondylolisthesis, lumbar region: Secondary | ICD-10-CM | POA: Diagnosis not present

## 2016-12-21 ENCOUNTER — Ambulatory Visit: Payer: BLUE CROSS/BLUE SHIELD

## 2016-12-30 ENCOUNTER — Ambulatory Visit: Payer: BLUE CROSS/BLUE SHIELD

## 2017-01-12 ENCOUNTER — Ambulatory Visit: Payer: BLUE CROSS/BLUE SHIELD

## 2017-01-20 ENCOUNTER — Other Ambulatory Visit: Payer: Self-pay | Admitting: Cardiology

## 2017-01-20 DIAGNOSIS — I10 Essential (primary) hypertension: Secondary | ICD-10-CM

## 2017-01-20 DIAGNOSIS — R0600 Dyspnea, unspecified: Secondary | ICD-10-CM

## 2017-01-20 DIAGNOSIS — R5383 Other fatigue: Secondary | ICD-10-CM

## 2017-01-20 DIAGNOSIS — R0609 Other forms of dyspnea: Secondary | ICD-10-CM

## 2017-01-24 NOTE — Addendum Note (Signed)
Addendum  created 01/24/17 1011 by Oleta Mouse, MD   Sign clinical note

## 2017-01-26 ENCOUNTER — Ambulatory Visit: Payer: BLUE CROSS/BLUE SHIELD

## 2017-03-22 DIAGNOSIS — D352 Benign neoplasm of pituitary gland: Secondary | ICD-10-CM | POA: Diagnosis not present

## 2017-03-22 DIAGNOSIS — Z0000 Encounter for general adult medical examination without abnormal findings: Secondary | ICD-10-CM | POA: Diagnosis not present

## 2017-03-22 DIAGNOSIS — Z01419 Encounter for gynecological examination (general) (routine) without abnormal findings: Secondary | ICD-10-CM | POA: Diagnosis not present

## 2017-03-22 DIAGNOSIS — Z6841 Body Mass Index (BMI) 40.0 and over, adult: Secondary | ICD-10-CM | POA: Diagnosis not present

## 2017-05-04 DIAGNOSIS — M25551 Pain in right hip: Secondary | ICD-10-CM | POA: Diagnosis not present

## 2017-05-04 DIAGNOSIS — M1612 Unilateral primary osteoarthritis, left hip: Secondary | ICD-10-CM | POA: Diagnosis not present

## 2017-05-04 DIAGNOSIS — Z6841 Body Mass Index (BMI) 40.0 and over, adult: Secondary | ICD-10-CM | POA: Diagnosis not present

## 2017-06-13 ENCOUNTER — Other Ambulatory Visit: Payer: Self-pay | Admitting: Cardiology

## 2017-06-13 DIAGNOSIS — R0609 Other forms of dyspnea: Secondary | ICD-10-CM

## 2017-06-13 DIAGNOSIS — I10 Essential (primary) hypertension: Secondary | ICD-10-CM

## 2017-06-13 DIAGNOSIS — R5383 Other fatigue: Secondary | ICD-10-CM

## 2017-06-13 DIAGNOSIS — R0600 Dyspnea, unspecified: Secondary | ICD-10-CM

## 2017-07-12 DIAGNOSIS — M5020 Other cervical disc displacement, unspecified cervical region: Secondary | ICD-10-CM | POA: Insufficient documentation

## 2017-07-12 DIAGNOSIS — O24419 Gestational diabetes mellitus in pregnancy, unspecified control: Secondary | ICD-10-CM | POA: Insufficient documentation

## 2017-07-12 DIAGNOSIS — M1990 Unspecified osteoarthritis, unspecified site: Secondary | ICD-10-CM | POA: Insufficient documentation

## 2017-07-12 DIAGNOSIS — T8859XA Other complications of anesthesia, initial encounter: Secondary | ICD-10-CM | POA: Insufficient documentation

## 2017-07-12 DIAGNOSIS — I10 Essential (primary) hypertension: Secondary | ICD-10-CM | POA: Insufficient documentation

## 2017-07-12 DIAGNOSIS — G4730 Sleep apnea, unspecified: Secondary | ICD-10-CM | POA: Insufficient documentation

## 2017-07-12 DIAGNOSIS — T4145XA Adverse effect of unspecified anesthetic, initial encounter: Secondary | ICD-10-CM | POA: Insufficient documentation

## 2017-07-12 DIAGNOSIS — L409 Psoriasis, unspecified: Secondary | ICD-10-CM | POA: Insufficient documentation

## 2017-07-25 ENCOUNTER — Encounter: Payer: Self-pay | Admitting: Cardiology

## 2017-07-25 ENCOUNTER — Ambulatory Visit: Payer: BLUE CROSS/BLUE SHIELD | Admitting: Cardiology

## 2017-07-25 ENCOUNTER — Encounter (INDEPENDENT_AMBULATORY_CARE_PROVIDER_SITE_OTHER): Payer: Self-pay

## 2017-07-25 DIAGNOSIS — I10 Essential (primary) hypertension: Secondary | ICD-10-CM

## 2017-07-25 DIAGNOSIS — N649 Disorder of breast, unspecified: Secondary | ICD-10-CM | POA: Insufficient documentation

## 2017-07-25 DIAGNOSIS — D352 Benign neoplasm of pituitary gland: Secondary | ICD-10-CM | POA: Insufficient documentation

## 2017-07-25 DIAGNOSIS — R0609 Other forms of dyspnea: Secondary | ICD-10-CM

## 2017-07-25 DIAGNOSIS — N951 Menopausal and female climacteric states: Secondary | ICD-10-CM | POA: Insufficient documentation

## 2017-07-25 DIAGNOSIS — R0600 Dyspnea, unspecified: Secondary | ICD-10-CM

## 2017-07-25 DIAGNOSIS — R5383 Other fatigue: Secondary | ICD-10-CM | POA: Diagnosis not present

## 2017-07-25 DIAGNOSIS — E669 Obesity, unspecified: Secondary | ICD-10-CM | POA: Insufficient documentation

## 2017-07-25 MED ORDER — HYDROCHLOROTHIAZIDE 25 MG PO TABS: 25.0000 mg | ORAL_TABLET | Freq: Every day | ORAL | 0 refills | Status: DC

## 2017-07-25 NOTE — Patient Instructions (Signed)
Medication Instructions:   Your physician recommends that you continue on your current medications as directed. Please refer to the Current Medication list given to you today.    Labwork:  TODAY--CMET, CBC W DIFF, TSH, AND LIPIDS    Follow-Up:  Your physician wants you to follow-up in: [place] will receive a reminder letter in the mail two months in advance. If you don't receive a letter, please call our office to schedule the follow-up appointment.      If you need a refill on your cardiac medications before your next appointment, please call your pharmacy.

## 2017-07-25 NOTE — Progress Notes (Addendum)
Cardiology Office Note    Date:  07/26/2017   ID:  EMI Marquez, DOB [birth year], MRN [id number]  PCP:  Miranda Heritage, NP  Cardiologist:   Miranda Dawley, MD  Referring physician: Vania Rea, MD  Chief complain: Fatigue dyspnea on exertion   History of Present Illness:  Miranda Marquez is a 56 y.o. female , this is a very pleasant patient with prior medical history of pituitary micro-adenoma resulting in 5 miscarriages, however she has 4 kids, status post hysterectomy, hyperlipidemia on diet only the patient states that for about a year she has noticed that she has no energy and gets short of breath with minimal exertion. This is a patient who in the past for [place]. 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 She went through a long period of time when she was either pregnant having a newborn, was accessing gain a lot of weight. Year ago she had hysterectomy after which they had heart time waking her up. She has noticed that since then she has just felt she has no energy and minimal activity causes her a lot of effort. She denies any chest pain, no palpitations or syncope. She has also noticed lower extremity edema but no orthopnea or paroxysmal nocturnal dyspnea. She states that she has never really been sick until now. She was referred to [place] for abnormal EKG.  Labs from 11/05/2015 showed LDL 141, triglycerides 117, HDL 62, electrolytes normal creatinine normal, LFTs normal, hemoglobin 14.4, normal TSH 0.9, normal free T4 and free T3, very low vitamin D that she is taking supplements for. In her family her mother had heart problems however still alive at age of 33. She has a very distant history of smoking at college.  07/25/2017 - 1 year follow up, she underwent a back surgery earlier this year, the patient is struggling with hip pain and unable to walk/exercise. She is supposed to undergo hip replacement in January. She denies any chest pain, SOB, LE edema, no  orthopnea, PND.  She has been complainat with her meds.  Ortho surgeon wants her to loose 30 lbs before surgery, she started a healthier diet and has lost 4 lbs so far.   Past Medical History:  Diagnosis Date  . Arthritis   . Cervical herniated disc   . Complication of anesthesia    hypotension  in april after hysterectomy  . Gestational diabetes    history..back 21 plus yrs ago.  Now she is ok  . Hypertension    not taking meds   . Pituitary microadenoma with hyperprolactinemia ([place])   . Psoriasis both elbows, scalp, left thigh, saw dr Allyson Sabal, last week given cream for areas healing  . Sleep apnea    never had study done, but things she does have it    Past Surgical History:  Procedure Laterality Date  . ABDOMINAL HYSTERECTOMY    . APPENDECTOMY    . BREAST LUMPECTOMY WITH RADIOACTIVE SEED LOCALIZATION Right 08/07/2015   Procedure: RIGHT BREAST LUMPECTOMY WITH RADIOACTIVE SEED LOCALIZATION AND EXCISION OF BREAST MASS X2;  Surgeon: Miranda Keens, MD;  Location: [facility];  Service: General;  Laterality: Right;  . BREAST SURGERY    . DILATION AND CURETTAGE OF UTERUS    . FOOT SURGERY     left heel    . HERNIA REPAIR     umbilical  . MAXIMUM ACCESS (MAS)POSTERIOR LUMBAR INTERBODY FUSION (PLIF) 1 LEVEL N/A 09/09/2016   Procedure: MAXIMUM ACCESS  SURGERY POSTERIOR LUMBAR INTERBODY FUSION LUMBAR THREE-FOUR;  Surgeon: Eustace Moore, MD;  Location: [facility];  Service: Neurosurgery;  Laterality: N/A;  . MINI-LAPAROTOMY W/ TUBAL LIGATION    . ROBOTIC ASSISTED TOTAL HYSTERECTOMY WITH BILATERAL SALPINGO OOPHERECTOMY Bilateral 12/03/2014   Procedure: ROBOTIC ASSISTED TOTAL HYSTERECTOMY WITH BILATERAL SALPINGO OOPHORECTOMY;  Surgeon: Miranda Amber, MD;  Location: [facility];  Service: Gynecology;  Laterality: Bilateral;  . TUBAL LIGATION      Current Medications: Outpatient Medications Prior to Visit  Medication Sig Dispense Refill  . bromocriptine (PARLODEL) 2.5 MG tablet Take 5 mg by mouth every  morning.   2  . OVER THE COUNTER MEDICATION Atribion- bought from [place].    . hydrochlorothiazide (HYDRODIURIL) 25 MG tablet Take 1 tablet (25 mg total) by mouth daily. Please make an appt with Dr. Meda Coffee, 2nd attempt 15 tablet 0  . HYDROcodone-acetaminophen (NORCO) 5-325 MG tablet Take 1-2 tablets by mouth every 4 (four) hours as needed for moderate pain. (Patient not taking: Reported on 07/25/2017) 60 tablet 0  . methocarbamol (ROBAXIN) 500 MG tablet Take 1 tablet (500 mg total) by mouth every 6 (six) hours as needed for muscle spasms. (Patient not taking: Reported on 07/25/2017) 60 tablet 1   No facility-administered medications prior to visit.     Allergies:   Patient has no known allergies.   Social History   Socioeconomic History  . Marital status: Married    Spouse name: None  . Number of children: None  . Years of education: None  . Highest education level: None  Social Needs  . Financial resource strain: None  . Food insecurity - worry: None  . Food insecurity - inability: None  . Transportation needs - medical: None  . Transportation needs - non-medical: None  Occupational History  . None  Tobacco Use  . Smoking status: Former Smoker    Last attempt to quit: 08/23/1978    Years since quitting: 38.9  . Smokeless tobacco: Never Used  Substance and Sexual Activity  . Alcohol use: Yes    Alcohol/week: 1.2 oz    Types: 2 Glasses of wine per week    Comment: occasional, weekends  . Drug use: No  . Sexual activity: Yes  Other Topics Concern  . None  Social History Narrative  . None    Family History:  The patient's family history includes Hypertension in her father.   ROS:   Please see the history of present illness.    ROS All other systems reviewed and are negative.  PHYSICAL EXAM:   VS:  BP 124/74   Pulse (!) 58   Ht 5\' 8"  (1.727 m)   Wt 282 lb (127.9 kg)   BMI 42.88 kg/m    GEN: Well nourished, well developed, in no acute distress  HEENT: normal    Neck: no JVD, carotid bruits, or masses Cardiac: RRR; no murmurs, rubs, or gallops, mild bilateral edema around the ankles. Respiratory:  clear to auscultation bilaterally, normal work of breathing GI: soft, nontender, nondistended, + BS MS: no deformity or atrophy  Skin: warm and dry, no rash Neuro:  Alert and Oriented x 3, Strength and sensation are intact Psych: euthymic mood, full affect  Wt Readings from Last 3 Encounters:  07/25/17 282 lb (127.9 kg)  09/09/16 293 lb 12.8 oz (133.3 kg)  09/02/16 293 lb 12.8 oz (133.3 kg)    Studies/Labs Reviewed:   EKG:  EKG is ordered today.  The ekg ordered today demonstrates  Normal sinus rhythm right bundle branch block, this is unchanged from prior EKG in April 2016.  Recent Labs: 07/25/2017: ALT 20; BUN 15; Creatinine, Ser 0.67; Hemoglobin 13.8; Platelets 240; Potassium 4.4; Sodium 142; TSH 0.924   Lipid Panel    Component Value Date/Time   CHOL 224 (H) 07/25/2017 1539   TRIG 101 07/25/2017 1539   HDL 65 07/25/2017 1539   CHOLHDL 3.4 07/25/2017 1539   LDLCALC 139 (H) 07/25/2017 1539   Additional studies/ records that were reviewed today include:  Records from primary care physician as described in history of present illness.  Echocardiogram: None in the past.   ASSESSMENT:    1. Essential hypertension   2. Hypertension, essential, benign   3. Other fatigue   4. DOE (dyspnea on exertion)    PLAN:  In order of problems listed above:  1. Hypertension - controlled 2. Fatigue, DOE - no ischemia on the stress test in 05/14/2016 3. Obesity - encorauges to start regular exercise after hip replacement, continue diet.  4. Hyperlipidemia - check lipids, CMP, CBC, TSH today  Medication Adjustments/Labs and Tests Ordered: Current medicines are reviewed at length with the patient today.  Concerns regarding medicines are outlined above.  Medication changes, Labs and Tests ordered today are listed in the Patient Instructions  below. Patient Instructions  Medication Instructions:   Your physician recommends that you continue on your current medications as directed. Please refer to the Current Medication list given to you today.    Labwork:  TODAY--CMET, CBC W DIFF, TSH, AND LIPIDS    Follow-Up:  Your physician wants you to follow-up in: [place] will receive a reminder letter in the mail two months in advance. If you don't receive a letter, please call our office to schedule the follow-up appointment.      If you need a refill on your cardiac medications before your next appointment, please call your pharmacy.      Signed, Miranda Dawley, MD  07/26/2017 8:58 AM    [address], [place], [place]  [address] Phone: [phone]; Fax: [phone]

## 2017-07-26 LAB — CBC WITH DIFFERENTIAL/PLATELET
Basophils Absolute: 0 10*3/uL (ref 0.0–0.2)
Basos: 0 %
EOS (ABSOLUTE): 0.2 10*3/uL (ref 0.0–0.4)
Eos: 3 %
Hematocrit: 39.5 % (ref 34.0–46.6)
Hemoglobin: 13.8 g/dL (ref 11.1–15.9)
Immature Grans (Abs): 0 10*3/uL (ref 0.0–0.1)
Immature Granulocytes: 0 %
Lymphocytes Absolute: 1.6 10*3/uL (ref 0.7–3.1)
Lymphs: 30 %
MCH: 30.2 pg (ref 26.6–33.0)
MCHC: 34.9 g/dL (ref 31.5–35.7)
MCV: 86 fL (ref 79–97)
Monocytes Absolute: 0.5 10*3/uL (ref 0.1–0.9)
Monocytes: 10 %
Neutrophils Absolute: 2.9 10*3/uL (ref 1.4–7.0)
Neutrophils: 57 %
Platelets: 240 10*3/uL (ref 150–379)
RBC: 4.57 x10E6/uL (ref 3.77–5.28)
RDW: 14.3 % (ref 12.3–15.4)
WBC: 5.2 10*3/uL (ref 3.4–10.8)

## 2017-07-26 LAB — LIPID PANEL
Chol/HDL Ratio: 3.4 ratio (ref 0.0–4.4)
Cholesterol, Total: 224 mg/dL — ABNORMAL HIGH (ref 100–199)
HDL: 65 mg/dL (ref >39)
LDL Calculated: 139 mg/dL — ABNORMAL HIGH (ref 0–99)
Triglycerides: 101 mg/dL (ref 0–149)
VLDL Cholesterol Cal: 20 mg/dL (ref 5–40)

## 2017-07-26 LAB — COMPREHENSIVE METABOLIC PANEL
ALT: 20 IU/L (ref 0–32)
AST: 18 IU/L (ref 0–40)
Albumin/Globulin Ratio: 1.8 (ref 1.2–2.2)
Albumin: 4.6 g/dL (ref 3.5–5.5)
Alkaline Phosphatase: 91 IU/L (ref 39–117)
BUN/Creatinine Ratio: 22 (ref 9–23)
BUN: 15 mg/dL (ref 6–24)
Bilirubin Total: 0.5 mg/dL (ref 0.0–1.2)
CO2: 25 mmol/L (ref 20–29)
Calcium: 9.1 mg/dL (ref 8.7–10.2)
Chloride: 105 mmol/L (ref 96–106)
Creatinine, Ser: 0.67 mg/dL (ref 0.57–1.00)
GFR calc Af Amer: 113 mL/min/{1.73_m2} (ref >59)
GFR calc non Af Amer: 98 mL/min/{1.73_m2} (ref >59)
Globulin, Total: 2.5 g/dL (ref 1.5–4.5)
Glucose: 94 mg/dL (ref 65–99)
Potassium: 4.4 mmol/L (ref 3.5–5.2)
Sodium: 142 mmol/L (ref 134–144)
Total Protein: 7.1 g/dL (ref 6.0–8.5)

## 2017-07-26 LAB — TSH: TSH: 0.924 u[IU]/mL (ref 0.450–4.500)

## 2017-09-15 DIAGNOSIS — M25551 Pain in right hip: Secondary | ICD-10-CM | POA: Diagnosis not present

## 2017-09-15 DIAGNOSIS — M1611 Unilateral primary osteoarthritis, right hip: Secondary | ICD-10-CM | POA: Diagnosis not present

## 2017-09-20 NOTE — Progress Notes (Signed)
Please place orders in Epic as patient is being scheduled for a pre-op appointment! Thank you! 

## 2017-10-06 NOTE — H&P (Signed)
TOTAL HIP ADMISSION H&P  Patient is admitted for right total hip arthroplasty, anterior approach.  Subjective:  Chief Complaint:    Right hip primary OA / pain  HPI: Miranda Marquez, 57 y.o. female, has a history of pain and functional disability in the right hip(s) due to arthritis and patient has failed non-surgical conservative treatments for greater than 12 weeks to include NSAID's and/or analgesics, use of assistive devices and activity modification.  Onset of symptoms was gradual starting 2.5 years ago with gradually worsening course since that time.The patient noted no past surgery on the right hip(s).  Patient currently rates pain in the right hip at 10 out of 10 with activity. Patient has night pain, worsening of pain with activity and weight bearing, trendelenberg gait, pain that interfers with activities of daily living and pain with passive range of motion. Patient has evidence of periarticular osteophytes and joint space narrowing by imaging studies. This condition presents safety issues increasing the risk of falls.   There is no current active infection.  Risks, benefits and expectations were discussed with the patient.  Risks including but not limited to the risk of anesthesia, blood clots, nerve damage, blood vessel damage, failure of the prosthesis, infection and up to and including death.  Patient understand the risks, benefits and expectations and wishes to proceed with surgery.   PCP: Vania Rea, MD  D/C Plans:       Home   Post-op Meds:       No Rx given  Tranexamic Acid:      To be given - IV   Decadron:      Is to be given  FYI:      ASA  Norco  DME:   Pt already has equipment  PT:   No PT    Patient Active Problem List   Diagnosis Date Noted  . Lesion of breast 07/25/2017  . Menopausal symptom 07/25/2017  . Obesity 07/25/2017  . Pituitary microadenoma ([place]) 07/25/2017  . Sleep apnea   . Psoriasis   . Hypertension   . Gestational diabetes   . Complication  of anesthesia   . Cervical herniated disc   . Arthritis   . S/P lumbar spinal fusion 09/09/2016  . Fatigue 01/21/2016  . DOE (dyspnea on exertion) 01/21/2016  . Endometrial hyperplasia 12/03/2014  . Morbid obesity with BMI of 40.0-44.9, adult ([place]) 10/28/2014  . Endometrial hyperplasia with atypia 10/28/2014  . Benign essential hypertension 10/28/2014  . Pituitary microadenoma with hyperprolactinemia ([place]) 10/28/2014   Past Medical History:  Diagnosis Date  . Arthritis   . Cervical herniated disc   . Complication of anesthesia    hypotension  in april after hysterectomy  . Gestational diabetes    history..back 21 plus yrs ago.  Now she is ok  . Hypertension    not taking meds   . Pituitary microadenoma with hyperprolactinemia ([place])   . Psoriasis both elbows, scalp, left thigh, saw dr Allyson Sabal, last week given cream for areas healing  . Sleep apnea    never had study done, but things she does have it    Past Surgical History:  Procedure Laterality Date  . ABDOMINAL HYSTERECTOMY    . APPENDECTOMY    . BREAST LUMPECTOMY WITH RADIOACTIVE SEED LOCALIZATION Right 08/07/2015   Procedure: RIGHT BREAST LUMPECTOMY WITH RADIOACTIVE SEED LOCALIZATION AND EXCISION OF BREAST MASS X2;  Surgeon: Coralie Keens, MD;  Location: [facility];  Service: General;  Laterality: Right;  . BREAST SURGERY    .  DILATION AND CURETTAGE OF UTERUS    . FOOT SURGERY     left heel    . HERNIA REPAIR     umbilical  . MAXIMUM ACCESS (MAS)POSTERIOR LUMBAR INTERBODY FUSION (PLIF) 1 LEVEL N/A 09/09/2016   Procedure: MAXIMUM ACCESS SURGERY POSTERIOR LUMBAR INTERBODY FUSION LUMBAR THREE-FOUR;  Surgeon: Eustace Moore, MD;  Location: [facility];  Service: Neurosurgery;  Laterality: N/A;  . MINI-LAPAROTOMY W/ TUBAL LIGATION    . ROBOTIC ASSISTED TOTAL HYSTERECTOMY WITH BILATERAL SALPINGO OOPHERECTOMY Bilateral 12/03/2014   Procedure: ROBOTIC ASSISTED TOTAL HYSTERECTOMY WITH BILATERAL SALPINGO OOPHORECTOMY;  Surgeon: Everitt Amber,  MD;  Location: [facility];  Service: Gynecology;  Laterality: Bilateral;  . TUBAL LIGATION      No current facility-administered medications for this encounter.    Current Outpatient Medications  Medication Sig Dispense Refill Last Dose  . bromocriptine (PARLODEL) 2.5 MG tablet Take 5 mg by mouth daily.   2 Taking  . DUEXIS 800-26.6 MG TABS Take 1 tablet by mouth 2 (two) times daily.  1   . hydrochlorothiazide (HYDRODIURIL) 25 MG tablet Take 1 tablet (25 mg total) by mouth daily. Please make an appt with Dr. Meda Coffee, 2nd attempt (Patient not taking: Reported on 10/03/2017) 15 tablet 0 Not Taking at Unknown time   No Known Allergies   Social History   Tobacco Use  . Smoking status: Former Smoker    Last attempt to quit: 08/23/1978    Years since quitting: 39.1  . Smokeless tobacco: Never Used  Substance Use Topics  . Alcohol use: Yes    Alcohol/week: 1.2 oz    Types: 2 Glasses of wine per week    Comment: occasional, weekends    Family History  Problem Relation Age of Onset  . Hypertension Father      Review of Systems  Constitutional: Positive for malaise/fatigue.  HENT: Negative.   Eyes: Negative.   Respiratory: Positive for shortness of breath (with exertion).   Cardiovascular: Negative.   Gastrointestinal: Negative.   Genitourinary: Negative.   Musculoskeletal: Positive for joint pain.  Skin: Negative.   Neurological: Negative.   Endo/Heme/Allergies: Negative.   Psychiatric/Behavioral: Negative.     Objective:  Physical Exam  Constitutional: She is oriented to person, place, and time. She appears well-developed.  HENT:  Head: Normocephalic.  Eyes: Pupils are equal, round, and reactive to light.  Neck: Neck supple. No JVD present. No tracheal deviation present. No thyromegaly present.  Cardiovascular: Normal rate, regular rhythm and intact distal pulses.  Respiratory: Effort normal and breath sounds normal. No respiratory distress. She has no wheezes.  GI: Soft.  There is no tenderness. There is no guarding.  Musculoskeletal:       Right hip: She exhibits decreased range of motion, decreased strength, tenderness and bony tenderness. She exhibits no swelling, no deformity and no laceration.  Lymphadenopathy:    She has no cervical adenopathy.  Neurological: She is alert and oriented to person, place, and time.  Skin: Skin is warm and dry.  Psychiatric: She has a normal mood and affect.      Labs:  Estimated body mass index is 42.88 kg/m as calculated from the following:   Height as of 07/25/17: 5\' 8"  (1.727 m).   Weight as of 07/25/17: 127.9 kg (282 lb).   Imaging Review Plain radiographs demonstrate severe degenerative joint disease of the right hip(s). The bone quality appears to be good for age and reported activity level.  Assessment/Plan:  End stage arthritis, right hip  The patient history, physical examination, clinical judgement of the provider and imaging studies are consistent with end stage degenerative joint disease of the right hip(s) and total hip arthroplasty is deemed medically necessary. The treatment options including medical management, injection therapy, arthroscopy and arthroplasty were discussed at length. The risks and benefits of total hip arthroplasty were presented and reviewed. The risks due to aseptic loosening, infection, stiffness, dislocation/subluxation,  thromboembolic complications and other imponderables were discussed.  The patient acknowledged the explanation, agreed to proceed with the plan and consent was signed. Patient is being admitted for inpatient treatment for surgery, pain control, PT, OT, prophylactic antibiotics, VTE prophylaxis, progressive ambulation and ADL's and discharge planning.The patient is planning to be discharged home.     West Pugh Pacen [place]   PA-C  10/06/2017, 4:17 PM

## 2017-10-10 NOTE — Patient Instructions (Addendum)
Miranda Marquez  10/10/2017   Your procedure is scheduled on: 10-18-17   Report to [facility]  Entrance Report to Admitting at 6:10 AM   Call this number if you have problems the morning of surgery [phone]   Remember: Do not eat food or drink liquids :After Midnight.     Take these medicines the morning of surgery with A SIP OF WATER: None                                You may not have any metal on your body including hair pins and              piercings  Do not wear jewelry, make-up, lotions, powders or perfumes, deodorant             Do not wear nail polish.  Do not shave  48 hours prior to surgery.              Do not bring valuables to the hospital. [facility].  Contacts, dentures or bridgework may not be worn into surgery.  Leave suitcase in the car. After surgery it may be brought to your room.                Please read over the following fact sheets you were given: _____________________________________________________________________         [facility] - Preparing for Surgery Before surgery, you can play an important role.  Because skin is not sterile, your skin needs to be as free of germs as possible.  You can reduce the number of germs on your skin by washing with CHG (chlorahexidine gluconate) soap before surgery.  CHG is an antiseptic cleaner which kills germs and bonds with the skin to continue killing germs even after washing. Please DO NOT use if you have an allergy to CHG or antibacterial soaps.  If your skin becomes reddened/irritated stop using the CHG and inform your nurse when you arrive at [facility]. Do not shave (including legs and underarms) for at least 48 hours prior to the first CHG shower.  You may shave your face/neck. Please follow these instructions carefully:  1.  Shower with CHG Soap the night before surgery and the  morning of Surgery.  2.  If you choose to wash your  hair, wash your hair first as usual with your  normal  shampoo.  3.  After you shampoo, rinse your hair and body thoroughly to remove the  shampoo.                           4.  Use CHG as you would any other liquid soap.  You can apply chg directly  to the skin and wash                       Gently with a scrungie or clean washcloth.  5.  Apply the CHG Soap to your body ONLY FROM THE NECK DOWN.   Do not use on face/ open  Wound or open sores. Avoid contact with eyes, ears mouth and genitals (private parts).                       Wash face,  Genitals (private parts) with your normal soap.             6.  Wash thoroughly, paying special attention to the area where your surgery  will be performed.  7.  Thoroughly rinse your body with warm water from the neck down.  8.  DO NOT shower/wash with your normal soap after using and rinsing off  the CHG Soap.                9.  Pat yourself dry with a clean towel.            10.  Wear clean pajamas.            11.  Place clean sheets on your bed the night of your first shower and do not  sleep with pets. Day of Surgery : Do not apply any lotions/deodorants the morning of surgery.  Please wear clean clothes to the hospital/surgery Marquez.  FAILURE TO FOLLOW THESE INSTRUCTIONS MAY RESULT IN THE CANCELLATION OF YOUR SURGERY PATIENT SIGNATURE_________________________________  NURSE SIGNATURE__________________________________  ________________________________________________________________________   Miranda Marquez  An incentive spirometer is a tool that can help keep your lungs clear and active. This tool measures how well you are filling your lungs with each breath. Taking long deep breaths may help reverse or decrease the chance of developing breathing (pulmonary) problems (especially infection) following:  A long period of time when you are unable to move or be active. BEFORE THE PROCEDURE   If the spirometer  includes an indicator to show your best effort, your nurse or respiratory therapist will set it to a desired goal.  If possible, sit up straight or lean slightly forward. Try not to slouch.  Hold the incentive spirometer in an upright position. INSTRUCTIONS FOR USE  1. Sit on the edge of your bed if possible, or sit up as far as you can in bed or on a chair. 2. Hold the incentive spirometer in an upright position. 3. Breathe out normally. 4. Place the mouthpiece in your mouth and seal your lips tightly around it. 5. Breathe in slowly and as deeply as possible, raising the piston or the ball toward the top of the column. 6. Hold your breath for 3-5 seconds or for as long as possible. Allow the piston or ball to fall to the bottom of the column. 7. Remove the mouthpiece from your mouth and breathe out normally. 8. Rest for a few seconds and repeat Steps 1 through 7 at least 10 times every 1-2 hours when you are awake. Take your time and take a few normal breaths between deep breaths. 9. The spirometer may include an indicator to show your best effort. Use the indicator as a goal to work toward during each repetition. 10. After each set of 10 deep breaths, practice coughing to be sure your lungs are clear. If you have an incision (the cut made at the time of surgery), support your incision when coughing by placing a pillow or rolled up towels firmly against it. Once you are able to get out of bed, walk around indoors and cough well. You may stop using the incentive spirometer when instructed by your caregiver.  RISKS AND COMPLICATIONS  Take your time so you do not get  dizzy or light-headed.  If you are in pain, you may need to take or ask for pain medication before doing incentive spirometry. It is harder to take a deep breath if you are having pain. AFTER USE  Rest and breathe slowly and easily.  It can be helpful to keep track of a log of your progress. Your caregiver can provide you with a  simple table to help with this. If you are using the spirometer at home, follow these instructions: [facility] IF:   You are having difficultly using the spirometer.  You have trouble using the spirometer as often as instructed.  Your pain medication is not giving enough relief while using the spirometer.  You develop fever of 100.5 F (38.1 C) or higher. SEEK IMMEDIATE MEDICAL CARE IF:   You cough up bloody sputum that had not been present before.  You develop fever of 102 F (38.9 C) or greater.  You develop worsening pain at or near the incision site. MAKE SURE YOU:   Understand these instructions.  Will watch your condition.  Will get help right away if you are not doing well or get worse. Document Released: 12/20/2006 Document Revised: 11/01/2011 Document Reviewed: 02/20/2007 Exit[employer] Patient Information 2014 [employer], [place].   ________________________________________________________________________  WHAT IS A BLOOD TRANSFUSION? Blood Transfusion Information  A transfusion is the replacement of blood or some of its parts. Blood is made up of multiple cells which provide different functions.  Red blood cells carry oxygen and are used for blood loss replacement.  White blood cells fight against infection.  Platelets control bleeding.  Plasma helps clot blood.  Other blood products are available for specialized needs, such as hemophilia or other clotting disorders. BEFORE THE TRANSFUSION  Who gives blood for transfusions?   Healthy volunteers who are fully evaluated to make sure their blood is safe. This is blood bank blood. Transfusion therapy is the safest it has ever been in the practice of medicine. Before blood is taken from a donor, a complete history is taken to make sure that person has no history of diseases nor engages in risky social behavior (examples are intravenous drug use or sexual activity with multiple partners). The donor's travel history  is screened to minimize risk of transmitting infections, such as malaria. The donated blood is tested for signs of infectious diseases, such as HIV and hepatitis. The blood is then tested to be sure it is compatible with you in order to minimize the chance of a transfusion reaction. If you or a relative donates blood, this is often done in anticipation of surgery and is not appropriate for emergency situations. It takes many days to process the donated blood. RISKS AND COMPLICATIONS Although transfusion therapy is very safe and saves many lives, the Marquez dangers of transfusion include:   Getting an infectious disease.  Developing a transfusion reaction. This is an allergic reaction to something in the blood you were given. Every precaution is taken to prevent this. The decision to have a blood transfusion has been considered carefully by your caregiver before blood is given. Blood is not given unless the benefits outweigh the risks. AFTER THE TRANSFUSION  Right after receiving a blood transfusion, you will usually feel much better and more energetic. This is especially true if your red blood cells have gotten low (anemic). The transfusion raises the level of the red blood cells which carry oxygen, and this usually causes an energy increase.  The nurse administering the transfusion will  monitor you carefully for complications. HOME CARE INSTRUCTIONS  No special instructions are needed after a transfusion. You may find your energy is better. Speak with your caregiver about any limitations on activity for underlying diseases you may have. SEEK MEDICAL CARE IF:   Your condition is not improving after your transfusion.  You develop redness or irritation at the intravenous (IV) site. SEEK IMMEDIATE MEDICAL CARE IF:  Any of the following symptoms occur over the next 12 hours:  Shaking chills.  You have a temperature by mouth above 102 F (38.9 C), not controlled by medicine.  Chest, back, or  muscle pain.  People around you feel you are not acting correctly or are confused.  Shortness of breath or difficulty breathing.  Dizziness and fainting.  You get a rash or develop hives.  You have a decrease in urine output.  Your urine turns a dark color or changes to pink, red, or brown. Any of the following symptoms occur over the next 10 days:  You have a temperature by mouth above 102 F (38.9 C), not controlled by medicine.  Shortness of breath.  Weakness after normal activity.  The white part of the eye turns yellow (jaundice).  You have a decrease in the amount of urine or are urinating less often.  Your urine turns a dark color or changes to pink, red, or brown. Document Released: 08/06/2000 Document Revised: 11/01/2011 Document Reviewed: 03/25/2008 [facility] Patient Information 2014 [place], [place].  _______________________________________________________________________

## 2017-10-10 NOTE — Progress Notes (Signed)
07-25-17 (Epic)  LOV with cardiologist, Dr. Meda Coffee  05-14-16 (Epic) Stress Test  05-12-16 (Epic) ECHO

## 2017-10-12 ENCOUNTER — Encounter (HOSPITAL_COMMUNITY)
Admission: RE | Admit: 2017-10-12 | Discharge: 2017-10-12 | Disposition: A | Discharge status: Home / Self Care | Payer: BLUE CROSS/BLUE SHIELD | Source: Ambulatory Visit | Attending: Orthopedic Surgery | Admitting: Orthopedic Surgery

## 2017-10-12 ENCOUNTER — Encounter (HOSPITAL_COMMUNITY): Payer: Self-pay

## 2017-10-12 ENCOUNTER — Other Ambulatory Visit: Payer: Self-pay

## 2017-10-12 DIAGNOSIS — Z01810 Encounter for preprocedural cardiovascular examination: Secondary | ICD-10-CM | POA: Insufficient documentation

## 2017-10-12 DIAGNOSIS — M1611 Unilateral primary osteoarthritis, right hip: Secondary | ICD-10-CM | POA: Diagnosis not present

## 2017-10-12 DIAGNOSIS — I10 Essential (primary) hypertension: Secondary | ICD-10-CM | POA: Insufficient documentation

## 2017-10-12 DIAGNOSIS — Z01812 Encounter for preprocedural laboratory examination: Secondary | ICD-10-CM | POA: Diagnosis not present

## 2017-10-12 LAB — SURGICAL PCR SCREEN
MRSA, PCR: NEGATIVE
Staphylococcus aureus: POSITIVE — AB

## 2017-10-12 LAB — BASIC METABOLIC PANEL
Anion gap: 10 (ref 5–15)
BUN: 17 mg/dL (ref 6–20)
CALCIUM: 9 mg/dL (ref 8.9–10.3)
CO2: 24 mmol/L (ref 22–32)
CREATININE: 0.75 mg/dL (ref 0.44–1.00)
Chloride: 105 mmol/L (ref 101–111)
GLUCOSE: 106 mg/dL — AB (ref 65–99)
Potassium: 3.6 mmol/L (ref 3.5–5.1)
Sodium: 139 mmol/L (ref 135–145)

## 2017-10-12 LAB — CBC
HCT: 42 % (ref 36.0–46.0)
Hemoglobin: 14.2 g/dL (ref 12.0–15.0)
MCH: 29.8 pg (ref 26.0–34.0)
MCHC: 33.8 g/dL (ref 30.0–36.0)
MCV: 88.1 fL (ref 78.0–100.0)
PLATELETS: 221 10*3/uL (ref 150–400)
RBC: 4.77 MIL/uL (ref 3.87–5.11)
RDW: 13.3 % (ref 11.5–15.5)
WBC: 4.6 10*3/uL (ref 4.0–10.5)

## 2017-10-13 NOTE — Progress Notes (Addendum)
10-12-17 PCR result routed to [facility] for review. Also spoke to Anesthesia regarding EKG results. No new orders at this time.

## 2017-10-17 MED ORDER — DEXTROSE 5 % IV SOLN
3.0000 g | INTRAVENOUS | Status: AC
  Administered 2017-10-18: 3 g via INTRAVENOUS
  Filled 2017-10-17: qty 3

## 2017-10-17 MED ORDER — TRANEXAMIC ACID 1000 MG/10ML IV SOLN
1000.0000 mg | INTRAVENOUS | Status: AC
  Administered 2017-10-18: 1000 mg via INTRAVENOUS
  Filled 2017-10-17: qty 1100

## 2017-10-18 ENCOUNTER — Inpatient Hospital Stay (HOSPITAL_COMMUNITY): Payer: BLUE CROSS/BLUE SHIELD

## 2017-10-18 ENCOUNTER — Encounter (HOSPITAL_COMMUNITY)
Admission: RE | Disposition: A | Discharge status: Home / Self Care | Payer: Self-pay | Source: Ambulatory Visit | Attending: Orthopedic Surgery

## 2017-10-18 ENCOUNTER — Encounter (HOSPITAL_COMMUNITY): Payer: Self-pay | Admitting: *Deleted

## 2017-10-18 ENCOUNTER — Other Ambulatory Visit: Payer: Self-pay

## 2017-10-18 ENCOUNTER — Inpatient Hospital Stay (HOSPITAL_COMMUNITY): Payer: BLUE CROSS/BLUE SHIELD | Admitting: Anesthesiology

## 2017-10-18 ENCOUNTER — Inpatient Hospital Stay (HOSPITAL_COMMUNITY)
Admission: RE | Admit: 2017-10-18 | Discharge: 2017-10-19 | DRG: 470 | Disposition: A | Discharge status: Home / Self Care | Payer: BLUE CROSS/BLUE SHIELD | Source: Ambulatory Visit | Attending: Orthopedic Surgery | Admitting: Orthopedic Surgery

## 2017-10-18 DIAGNOSIS — E221 Hyperprolactinemia: Secondary | ICD-10-CM | POA: Diagnosis not present

## 2017-10-18 DIAGNOSIS — Z9851 Tubal ligation status: Secondary | ICD-10-CM

## 2017-10-18 DIAGNOSIS — Z96649 Presence of unspecified artificial hip joint: Secondary | ICD-10-CM

## 2017-10-18 DIAGNOSIS — Z96641 Presence of right artificial hip joint: Secondary | ICD-10-CM

## 2017-10-18 DIAGNOSIS — N8502 Endometrial intraepithelial neoplasia [EIN]: Secondary | ICD-10-CM | POA: Diagnosis not present

## 2017-10-18 DIAGNOSIS — L409 Psoriasis, unspecified: Secondary | ICD-10-CM | POA: Diagnosis not present

## 2017-10-18 DIAGNOSIS — Z9049 Acquired absence of other specified parts of digestive tract: Secondary | ICD-10-CM | POA: Diagnosis not present

## 2017-10-18 DIAGNOSIS — I10 Essential (primary) hypertension: Secondary | ICD-10-CM | POA: Diagnosis present

## 2017-10-18 DIAGNOSIS — M1631 Unilateral osteoarthritis resulting from hip dysplasia, right hip: Secondary | ICD-10-CM | POA: Diagnosis not present

## 2017-10-18 DIAGNOSIS — M1611 Unilateral primary osteoarthritis, right hip: Principal | ICD-10-CM | POA: Diagnosis present

## 2017-10-18 DIAGNOSIS — Z8249 Family history of ischemic heart disease and other diseases of the circulatory system: Secondary | ICD-10-CM

## 2017-10-18 DIAGNOSIS — Z87891 Personal history of nicotine dependence: Secondary | ICD-10-CM | POA: Diagnosis not present

## 2017-10-18 DIAGNOSIS — Z6841 Body Mass Index (BMI) 40.0 and over, adult: Secondary | ICD-10-CM | POA: Diagnosis not present

## 2017-10-18 DIAGNOSIS — G4730 Sleep apnea, unspecified: Secondary | ICD-10-CM | POA: Diagnosis present

## 2017-10-18 DIAGNOSIS — Z90710 Acquired absence of both cervix and uterus: Secondary | ICD-10-CM

## 2017-10-18 DIAGNOSIS — Z981 Arthrodesis status: Secondary | ICD-10-CM

## 2017-10-18 DIAGNOSIS — E669 Obesity, unspecified: Secondary | ICD-10-CM | POA: Diagnosis present

## 2017-10-18 HISTORY — PX: TOTAL HIP ARTHROPLASTY: SHX124

## 2017-10-18 LAB — TYPE AND SCREEN
ABO/RH(D): O POS
Antibody Screen: NEGATIVE

## 2017-10-18 SURGERY — ARTHROPLASTY, HIP, TOTAL, ANTERIOR APPROACH
Anesthesia: Spinal | Site: Hip | Laterality: Right

## 2017-10-18 MED ORDER — FENTANYL CITRATE (PF) 100 MCG/2ML IJ SOLN
INTRAMUSCULAR | Status: AC
  Filled 2017-10-18: qty 2

## 2017-10-18 MED ORDER — HYDROMORPHONE HCL 1 MG/ML IJ SOLN: 0.5000 mg | INTRAMUSCULAR | Status: DC | PRN

## 2017-10-18 MED ORDER — HYDROCODONE-ACETAMINOPHEN 7.5-325 MG PO TABS: 2.0000 | ORAL_TABLET | ORAL | Status: DC | PRN

## 2017-10-18 MED ORDER — LACTATED RINGERS IV SOLN
INTRAVENOUS | Status: DC
  Administered 2017-10-18: 07:00:00 via INTRAVENOUS

## 2017-10-18 MED ORDER — DOCUSATE SODIUM 100 MG PO CAPS
100.0000 mg | ORAL_CAPSULE | Freq: Two times a day (BID) | ORAL | Status: DC
  Administered 2017-10-18 – 2017-10-19 (×2): 100 mg via ORAL
  Filled 2017-10-18 (×3): qty 1

## 2017-10-18 MED ORDER — FERROUS SULFATE 325 (65 FE) MG PO TABS: 325.0000 mg | ORAL_TABLET | Freq: Three times a day (TID) | ORAL | 3 refills | Status: DC

## 2017-10-18 MED ORDER — ONDANSETRON HCL 4 MG/2ML IJ SOLN: 4.0000 mg | Freq: Three times a day (TID) | INTRAMUSCULAR | Status: DC | PRN

## 2017-10-18 MED ORDER — PHENYLEPHRINE HCL 10 MG/ML IJ SOLN
INTRAMUSCULAR | Status: AC
  Filled 2017-10-18: qty 1

## 2017-10-18 MED ORDER — PROPOFOL 10 MG/ML IV BOLUS
INTRAVENOUS | Status: AC
  Filled 2017-10-18: qty 40

## 2017-10-18 MED ORDER — FERROUS SULFATE 325 (65 FE) MG PO TABS
325.0000 mg | ORAL_TABLET | Freq: Three times a day (TID) | ORAL | Status: DC
  Administered 2017-10-19 (×2): 325 mg via ORAL
  Filled 2017-10-18 (×2): qty 1

## 2017-10-18 MED ORDER — MIDAZOLAM HCL 2 MG/2ML IJ SOLN
INTRAMUSCULAR | Status: AC
  Filled 2017-10-18: qty 2

## 2017-10-18 MED ORDER — CEFAZOLIN SODIUM-DEXTROSE 2-4 GM/100ML-% IV SOLN
2.0000 g | Freq: Four times a day (QID) | INTRAVENOUS | Status: AC
  Administered 2017-10-18 (×2): 2 g via INTRAVENOUS
  Filled 2017-10-18 (×2): qty 100

## 2017-10-18 MED ORDER — ONDANSETRON HCL 4 MG/2ML IJ SOLN
INTRAMUSCULAR | Status: DC | PRN
  Administered 2017-10-18: 4 mg via INTRAVENOUS

## 2017-10-18 MED ORDER — MENTHOL 3 MG MT LOZG
1.0000 | LOZENGE | OROMUCOSAL | Status: DC | PRN
  Filled 2017-10-18: qty 9

## 2017-10-18 MED ORDER — PROMETHAZINE HCL 25 MG/ML IJ SOLN: 6.2500 mg | INTRAMUSCULAR | Status: DC | PRN

## 2017-10-18 MED ORDER — DIPHENHYDRAMINE HCL 12.5 MG/5ML PO ELIX: 12.5000 mg | ORAL_SOLUTION | ORAL | Status: DC | PRN

## 2017-10-18 MED ORDER — MAGNESIUM CITRATE PO SOLN: 1.0000 | Freq: Once | ORAL | Status: DC | PRN

## 2017-10-18 MED ORDER — ACETAMINOPHEN 650 MG RE SUPP: 650.0000 mg | RECTAL | Status: DC | PRN

## 2017-10-18 MED ORDER — PROPOFOL 10 MG/ML IV BOLUS
INTRAVENOUS | Status: AC
  Filled 2017-10-18: qty 60

## 2017-10-18 MED ORDER — STERILE WATER FOR IRRIGATION IR SOLN
Status: DC | PRN
  Administered 2017-10-18: 2000 mL

## 2017-10-18 MED ORDER — METHOCARBAMOL 500 MG PO TABS
500.0000 mg | ORAL_TABLET | Freq: Four times a day (QID) | ORAL | Status: DC | PRN
  Administered 2017-10-18: 500 mg via ORAL
  Filled 2017-10-18: qty 1

## 2017-10-18 MED ORDER — BROMOCRIPTINE MESYLATE 2.5 MG PO TABS
5.0000 mg | ORAL_TABLET | Freq: Every day | ORAL | Status: DC
  Administered 2017-10-19: 5 mg via ORAL
  Filled 2017-10-18: qty 2

## 2017-10-18 MED ORDER — FENTANYL CITRATE (PF) 100 MCG/2ML IJ SOLN
INTRAMUSCULAR | Status: DC | PRN
  Administered 2017-10-18: 50 ug via INTRAVENOUS

## 2017-10-18 MED ORDER — HYDROCODONE-ACETAMINOPHEN 7.5-325 MG PO TABS: 1.0000 | ORAL_TABLET | ORAL | 0 refills | Status: DC | PRN

## 2017-10-18 MED ORDER — METOCLOPRAMIDE HCL 5 MG/ML IJ SOLN: 5.0000 mg | Freq: Three times a day (TID) | INTRAMUSCULAR | Status: DC | PRN

## 2017-10-18 MED ORDER — MIDAZOLAM HCL 5 MG/5ML IJ SOLN
INTRAMUSCULAR | Status: DC | PRN
  Administered 2017-10-18: 2 mg via INTRAVENOUS

## 2017-10-18 MED ORDER — PHENOL 1.4 % MT LIQD
1.0000 | OROMUCOSAL | Status: DC | PRN
  Filled 2017-10-18: qty 177

## 2017-10-18 MED ORDER — DOCUSATE SODIUM 100 MG PO CAPS: 100.0000 mg | ORAL_CAPSULE | Freq: Two times a day (BID) | ORAL | 0 refills | Status: DC

## 2017-10-18 MED ORDER — ASPIRIN 81 MG PO CHEW: 81.0000 mg | CHEWABLE_TABLET | Freq: Two times a day (BID) | ORAL | 0 refills | Status: AC

## 2017-10-18 MED ORDER — PROPOFOL 500 MG/50ML IV EMUL
INTRAVENOUS | Status: DC | PRN
  Administered 2017-10-18: 75 ug/kg/min via INTRAVENOUS

## 2017-10-18 MED ORDER — ALUM & MAG HYDROXIDE-SIMETH 200-200-20 MG/5ML PO SUSP: 15.0000 mL | ORAL | Status: DC | PRN

## 2017-10-18 MED ORDER — CHLORHEXIDINE GLUCONATE 4 % EX LIQD: 60.0000 mL | Freq: Once | CUTANEOUS | Status: DC

## 2017-10-18 MED ORDER — SODIUM CHLORIDE 0.9 % IR SOLN
Status: DC | PRN
Start: 2017-10-18 — End: 2017-10-18
  Administered 2017-10-18: 1000 mL

## 2017-10-18 MED ORDER — PROPOFOL 10 MG/ML IV BOLUS
INTRAVENOUS | Status: AC
  Filled 2017-10-18: qty 20

## 2017-10-18 MED ORDER — SODIUM CHLORIDE 0.9 % IV SOLN
INTRAVENOUS | Status: DC
  Administered 2017-10-18 – 2017-10-19 (×2): via INTRAVENOUS

## 2017-10-18 MED ORDER — HYDROMORPHONE HCL 1 MG/ML IJ SOLN
INTRAMUSCULAR | Status: AC
  Filled 2017-10-18: qty 1

## 2017-10-18 MED ORDER — PROPOFOL 10 MG/ML IV BOLUS
INTRAVENOUS | Status: DC | PRN
  Administered 2017-10-18: 40 mg via INTRAVENOUS

## 2017-10-18 MED ORDER — DEXTROSE 5 % IV SOLN
INTRAVENOUS | Status: DC | PRN
  Administered 2017-10-18: 40 ug/min via INTRAVENOUS

## 2017-10-18 MED ORDER — OXYCODONE HCL 5 MG/5ML PO SOLN: 5.0000 mg | Freq: Once | ORAL | Status: DC | PRN

## 2017-10-18 MED ORDER — ONDANSETRON HCL 4 MG PO TABS: 4.0000 mg | ORAL_TABLET | Freq: Three times a day (TID) | ORAL | Status: DC | PRN

## 2017-10-18 MED ORDER — METHOCARBAMOL 500 MG PO TABS: 500.0000 mg | ORAL_TABLET | Freq: Four times a day (QID) | ORAL | 0 refills | Status: DC | PRN

## 2017-10-18 MED ORDER — BUPIVACAINE IN DEXTROSE 0.75-8.25 % IT SOLN
INTRATHECAL | Status: DC | PRN
  Administered 2017-10-18: 2 mL via INTRATHECAL

## 2017-10-18 MED ORDER — METHOCARBAMOL 1000 MG/10ML IJ SOLN
500.0000 mg | Freq: Four times a day (QID) | INTRAVENOUS | Status: DC | PRN
  Administered 2017-10-18: 500 mg via INTRAVENOUS
  Filled 2017-10-18: qty 550

## 2017-10-18 MED ORDER — OXYCODONE HCL 5 MG PO TABS: 5.0000 mg | ORAL_TABLET | Freq: Once | ORAL | Status: DC | PRN

## 2017-10-18 MED ORDER — HYDROMORPHONE HCL 1 MG/ML IJ SOLN
0.2500 mg | INTRAMUSCULAR | Status: DC | PRN
  Administered 2017-10-18: 0.25 mg via INTRAVENOUS

## 2017-10-18 MED ORDER — HYDROCODONE-ACETAMINOPHEN 7.5-325 MG PO TABS
1.0000 | ORAL_TABLET | ORAL | Status: DC | PRN
  Administered 2017-10-18 – 2017-10-19 (×3): 1 via ORAL
  Filled 2017-10-18 (×3): qty 1

## 2017-10-18 MED ORDER — TRANEXAMIC ACID 1000 MG/10ML IV SOLN
1000.0000 mg | Freq: Once | INTRAVENOUS | Status: AC
  Administered 2017-10-18: 1000 mg via INTRAVENOUS
  Filled 2017-10-18: qty 1100

## 2017-10-18 MED ORDER — ACETAMINOPHEN 325 MG PO TABS: 650.0000 mg | ORAL_TABLET | ORAL | Status: DC | PRN

## 2017-10-18 MED ORDER — POLYETHYLENE GLYCOL 3350 17 G PO PACK: 17.0000 g | PACK | Freq: Two times a day (BID) | ORAL | 0 refills | Status: DC

## 2017-10-18 MED ORDER — BISACODYL 10 MG RE SUPP: 10.0000 mg | Freq: Every day | RECTAL | Status: DC | PRN

## 2017-10-18 MED ORDER — ONDANSETRON HCL 4 MG/2ML IJ SOLN
INTRAMUSCULAR | Status: AC
  Filled 2017-10-18: qty 2

## 2017-10-18 MED ORDER — CELECOXIB 200 MG PO CAPS
200.0000 mg | ORAL_CAPSULE | Freq: Two times a day (BID) | ORAL | Status: DC
  Administered 2017-10-18 – 2017-10-19 (×2): 200 mg via ORAL
  Filled 2017-10-18 (×2): qty 1

## 2017-10-18 MED ORDER — ASPIRIN 81 MG PO CHEW
81.0000 mg | CHEWABLE_TABLET | Freq: Two times a day (BID) | ORAL | Status: DC
  Administered 2017-10-18 – 2017-10-19 (×2): 81 mg via ORAL
  Filled 2017-10-18 (×2): qty 1

## 2017-10-18 MED ORDER — METOCLOPRAMIDE HCL 5 MG PO TABS: 5.0000 mg | ORAL_TABLET | Freq: Three times a day (TID) | ORAL | Status: DC | PRN

## 2017-10-18 MED ORDER — DEXAMETHASONE SODIUM PHOSPHATE 10 MG/ML IJ SOLN
10.0000 mg | Freq: Once | INTRAMUSCULAR | Status: AC
  Administered 2017-10-18: 10 mg via INTRAVENOUS

## 2017-10-18 MED ORDER — POLYETHYLENE GLYCOL 3350 17 G PO PACK
17.0000 g | PACK | Freq: Two times a day (BID) | ORAL | Status: DC
  Administered 2017-10-18 – 2017-10-19 (×2): 17 g via ORAL
  Filled 2017-10-18 (×2): qty 1

## 2017-10-18 MED ORDER — DEXAMETHASONE SODIUM PHOSPHATE 10 MG/ML IJ SOLN
INTRAMUSCULAR | Status: AC
  Filled 2017-10-18: qty 1

## 2017-10-18 MED ORDER — DEXAMETHASONE SODIUM PHOSPHATE 10 MG/ML IJ SOLN
10.0000 mg | Freq: Once | INTRAMUSCULAR | Status: AC
  Administered 2017-10-19: 10 mg via INTRAVENOUS

## 2017-10-18 SURGICAL SUPPLY — 39 items
ADH SKN CLS APL DERMABOND .7 (GAUZE/BANDAGES/DRESSINGS) ×1
BAG SPEC THK2 15X12 ZIP CLS (MISCELLANEOUS) ×1
BAG ZIPLOCK 12X15 (MISCELLANEOUS) ×1 IMPLANT
BLADE SAG 18X100X1.27 (BLADE) ×2 IMPLANT
CAPT HIP TOTAL 2 ×1 IMPLANT
CLOTH BEACON ORANGE TIMEOUT ST (SAFETY) ×2 IMPLANT
COVER PERINEAL POST (MISCELLANEOUS) ×2 IMPLANT
COVER SURGICAL LIGHT HANDLE (MISCELLANEOUS) ×2 IMPLANT
DERMABOND ADVANCED (GAUZE/BANDAGES/DRESSINGS) ×1
DERMABOND ADVANCED .7 DNX12 (GAUZE/BANDAGES/DRESSINGS) ×1 IMPLANT
DRAPE STERI IOBAN 125X83 (DRAPES) ×2 IMPLANT
DRAPE U-SHAPE 47X51 STRL (DRAPES) ×4 IMPLANT
DRESSING AQUACEL AG SP 3.5X10 (GAUZE/BANDAGES/DRESSINGS) ×1 IMPLANT
DRSG AQUACEL AG SP 3.5X10 (GAUZE/BANDAGES/DRESSINGS) ×2
DURAPREP 26ML APPLICATOR (WOUND CARE) ×2 IMPLANT
ELECT REM PT RETURN 15FT ADLT (MISCELLANEOUS) ×2 IMPLANT
GLOVE BIO SURGEON STRL SZ 6.5 (GLOVE) ×1 IMPLANT
GLOVE BIOGEL M STRL SZ7.5 (GLOVE) ×2 IMPLANT
GLOVE BIOGEL PI IND STRL 6.5 (GLOVE) IMPLANT
GLOVE BIOGEL PI IND STRL 7.0 (GLOVE) IMPLANT
GLOVE BIOGEL PI IND STRL 7.5 (GLOVE) ×1 IMPLANT
GLOVE BIOGEL PI IND STRL 8.5 (GLOVE) ×1 IMPLANT
GLOVE BIOGEL PI INDICATOR 6.5 (GLOVE) ×1
GLOVE BIOGEL PI INDICATOR 7.0 (GLOVE) ×3
GLOVE BIOGEL PI INDICATOR 7.5 (GLOVE) ×5
GLOVE BIOGEL PI INDICATOR 8.5 (GLOVE) ×1
GLOVE ECLIPSE 8.0 STRL XLNG CF (GLOVE) ×4 IMPLANT
GLOVE ORTHO TXT STRL SZ7.5 (GLOVE) ×2 IMPLANT
GOWN STRL REUS W/TWL LRG LVL3 (GOWN DISPOSABLE) ×4 IMPLANT
GOWN STRL REUS W/TWL XL LVL3 (GOWN DISPOSABLE) ×3 IMPLANT
HOLDER FOLEY CATH W/STRAP (MISCELLANEOUS) ×2 IMPLANT
PACK ANTERIOR HIP CUSTOM (KITS) ×2 IMPLANT
SUT MNCRL AB 4-0 PS2 18 (SUTURE) ×2 IMPLANT
SUT STRATAFIX SPIRL PDS+ 45CM (SUTURE) ×1 IMPLANT
SUT VIC AB 1 CT1 36 (SUTURE) ×6 IMPLANT
SUT VIC AB 2-0 CT1 27 (SUTURE) ×6
SUT VIC AB 2-0 CT1 TAPERPNT 27 (SUTURE) ×2 IMPLANT
TRAY FOLEY CATH SILVER 14FR (SET/KITS/TRAYS/PACK) ×1 IMPLANT
YANKAUER SUCT BULB TIP 10FT TU (MISCELLANEOUS) ×1 IMPLANT

## 2017-10-18 NOTE — Discharge Instructions (Signed)

## 2017-10-18 NOTE — Transfer of Care (Signed)
Immediate Anesthesia Transfer of Care Note  Patient: Miranda Marquez  Procedure(s) Performed: RIGHT TOTAL HIP ARTHROPLASTY ANTERIOR APPROACH (Right Hip)  Patient Location: PACU  Anesthesia Type:Spinal  Level of Consciousness: awake, alert  and oriented  Airway & Oxygen Therapy: Patient Spontanous Breathing and Patient connected to face mask oxygen  Post-op Assessment: Report given to RN and Post -op Vital signs reviewed and stable  Post vital signs: Reviewed and stable  Last Vitals:  Vitals:   10/18/17 0625  BP: (!) 156/82  Pulse: 88  Resp: 18  Temp: 37.6 C  SpO2: 94%    Last Pain:  Vitals:   10/18/17 0646  TempSrc:   PainSc: 6       Patients Stated Pain Goal: 4 (29/56/21 3086)  Complications: No apparent anesthesia complications

## 2017-10-18 NOTE — Evaluation (Signed)
Physical Therapy Evaluation Patient Details Name: Miranda Marquez MRN: [id number] DOB: 1961-02-03 Today's Date: 10/18/2017   History of Present Illness  R THA, h/o PLIF L-4-L4 January 2018  Clinical Impression  Pt is s/p THA resulting in the deficits listed below (see PT Problem List). Pt ambulated 25' with RW, no loss of balance. Initiated THA HEP. Good progress expected.  Pt will benefit from skilled PT to increase their independence and safety with mobility to allow discharge to the venue listed below.      Follow Up Recommendations Follow surgeon's recommendation for DC plan and follow-up therapies    Equipment Recommendations  Rolling walker with 5" wheels;3in1 (PT)    Recommendations for Other Services       Precautions / Restrictions Precautions Precautions: Fall Restrictions Weight Bearing Restrictions: No Other Position/Activity Restrictions: WBAT      Mobility  Bed Mobility Overal bed mobility: Needs Assistance Bed Mobility: Supine to Sit     Supine to sit: Min assist;HOB elevated     General bed mobility comments: assist for RLE  Transfers Overall transfer level: Needs assistance Equipment used: Rolling walker (2 wheeled) Transfers: Sit to/from Stand Sit to Stand: Min assist         General transfer comment: VCs hand placement  Ambulation/Gait Ambulation/Gait assistance: Min guard Ambulation Distance (Feet): 25 Feet Assistive device: Rolling walker (2 wheeled) Gait Pattern/deviations: Step-to pattern;Decreased step length - right;Decreased step length - left   Gait velocity interpretation: Below normal speed for age/gender General Gait Details: VCs sequencing, no LOB, distance limited by leaking catheter  Stairs            Wheelchair Mobility    Modified Rankin (Stroke Patients Only)       Balance Overall balance assessment: Modified Independent                                           Pertinent Vitals/Pain Pain  Assessment: 0-10 Pain Score: 3  Pain Location: R hip Pain Descriptors / Indicators: Sore;Tender Pain Intervention(s): Limited activity within patient's tolerance;Monitored during session;Premedicated before session;Ice applied    Home Living Family/patient expects to be discharged to:: Private residence Living Arrangements: Spouse/significant other Available Help at Discharge: Family;Available 24 hours/day   Home Access: Stairs to enter Entrance Stairs-Rails: Chemical engineer of Steps: 4 Home Layout: Able to live on main level with bedroom/bathroom Home Equipment: Cane - single point;Walker - 4 wheels;Bedside commode      Prior Function Level of Independence: Independent with assistive device(s)         Comments: walked with 2 canes     Hand Dominance        Extremity/Trunk Assessment   Upper Extremity Assessment Upper Extremity Assessment: Overall WFL for tasks assessed    Lower Extremity Assessment Lower Extremity Assessment: RLE deficits/detail RLE Deficits / Details: hip 2/5, hip AAROM WFL, knee ext +3/5    Cervical / Trunk Assessment Cervical / Trunk Assessment: Normal  Communication   Communication: No difficulties  Cognition Arousal/Alertness: Awake/alert Behavior During Therapy: WFL for tasks assessed/performed Overall Cognitive Status: Within Functional Limits for tasks assessed                                        General Comments  Exercises Total Joint Exercises Ankle Circles/Pumps: AROM;Both;10 reps;Supine Quad Sets: AROM;Both;5 reps;Supine Heel Slides: AAROM;Right;10 reps;Supine Hip ABduction/ADduction: AAROM;Right;10 reps;Supine Long Arc Quad: AROM;Right;10 reps;Seated   Assessment/Plan    PT Assessment Patient needs continued PT services  PT Problem List Decreased mobility;Decreased strength;Decreased activity tolerance       PT Treatment Interventions      PT Goals (Current goals can be  found in the Care Plan section)  Acute Rehab PT Goals Patient Stated Goal: to walk PT Goal Formulation: With patient Time For Goal Achievement: 10/25/17 Potential to Achieve Goals: Good    Frequency 7X/week   Barriers to discharge        Co-evaluation               AM-PAC PT "6 Clicks" Daily Activity  Outcome Measure Difficulty turning over in bed (including adjusting bedclothes, sheets and blankets)?: A Little Difficulty moving from lying on back to sitting on the side of the bed? : Unable Difficulty sitting down on and standing up from a chair with arms (e.g., wheelchair, bedside commode, etc,.)?: Unable Help needed moving to and from a bed to chair (including a wheelchair)?: A Little Help needed walking in hospital room?: A Little Help needed climbing 3-5 steps with a railing? : A Lot 6 Click Score: 13    End of Session Equipment Utilized During Treatment: Gait belt Activity Tolerance: Patient tolerated treatment well Patient left: in chair;with call bell/phone within reach Nurse Communication: Mobility status      Time: 3845-3646 PT Time Calculation (min) (ACUTE ONLY): 23 min   Charges:   PT Evaluation $PT Eval Low Complexity: 1 Low PT Treatments $Gait Training: 8-22 mins   PT G Codes:         Philomena Doheny 10/18/2017, 4:35 PM [phone]

## 2017-10-18 NOTE — Interval H&P Note (Signed)
History and Physical Interval Note:  10/18/2017 6:48 AM  Miranda Marquez  has presented today for surgery, with the diagnosis of Right hip osteoarthrits  The various methods of treatment have been discussed with the patient and family. After consideration of risks, benefits and other options for treatment, the patient has consented to  Procedure(s) with comments: RIGHT TOTAL HIP ARTHROPLASTY ANTERIOR APPROACH (Right) - 70 mins as a surgical intervention .  The patient's history has been reviewed, patient examined, no change in status, stable for surgery.  I have reviewed the patient's chart and labs.  Questions were answered to the patient's satisfaction.     Mauri Pole

## 2017-10-18 NOTE — Plan of Care (Signed)
Plan of care discussed.   

## 2017-10-18 NOTE — Anesthesia Preprocedure Evaluation (Signed)
Anesthesia Evaluation  Patient identified by MRN, date of birth, ID band Patient awake    Reviewed: Allergy & Precautions, NPO status , Patient's Chart, lab work & pertinent test results  Airway Mallampati: II  TM Distance: >3 FB Neck ROM: Full    Dental no notable dental hx.    Pulmonary sleep apnea , former smoker,    Pulmonary exam normal breath sounds clear to auscultation       Cardiovascular hypertension, Normal cardiovascular exam Rhythm:Regular Rate:Normal     Neuro/Psych negative neurological ROS  negative psych ROS   GI/Hepatic negative GI ROS, Neg liver ROS,   Endo/Other  diabetesMorbid obesity  Renal/GU negative Renal ROS  negative genitourinary   Musculoskeletal negative musculoskeletal ROS (+)   Abdominal   Peds negative pediatric ROS (+)  Hematology negative hematology ROS (+)   Anesthesia Other Findings   Reproductive/Obstetrics negative OB ROS                             Anesthesia Physical Anesthesia Plan  ASA: III  Anesthesia Plan: Spinal   Post-op Pain Management:    Induction: Intravenous  PONV Risk Score and Plan: 2 and Ondansetron and Dexamethasone  Airway Management Planned: Simple Face Mask  Additional Equipment:   Intra-op Plan:   Post-operative Plan:   Informed Consent: I have reviewed the patients History and Physical, chart, labs and discussed the procedure including the risks, benefits and alternatives for the proposed anesthesia with the patient or authorized representative who has indicated his/her understanding and acceptance.   Dental advisory given  Plan Discussed with: CRNA and Surgeon  Anesthesia Plan Comments: (Previous lumbar fusion, if inaccessible will convert to [facility])        Anesthesia Quick Evaluation

## 2017-10-18 NOTE — Anesthesia Postprocedure Evaluation (Signed)
Anesthesia Post Note  Patient: Miranda Marquez  Procedure(s) Performed: RIGHT TOTAL HIP ARTHROPLASTY ANTERIOR APPROACH (Right Hip)     Patient location during evaluation: PACU Anesthesia Type: Spinal Level of consciousness: oriented and awake and alert Pain management: pain level controlled Vital Signs Assessment: post-procedure vital signs reviewed and stable Respiratory status: spontaneous breathing, respiratory function stable and patient connected to nasal cannula oxygen Cardiovascular status: blood pressure returned to baseline and stable Postop Assessment: no headache, no backache and no apparent nausea or vomiting Anesthetic complications: no    Last Vitals:  Vitals:   10/18/17 1100 10/18/17 1115  BP: 136/82 129/75  Pulse: 80 77  Resp: 18 15  Temp:    SpO2: 95% 96%    Last Pain:  Vitals:   10/18/17 1124  TempSrc:   PainSc: 2     LLE Motor Response: Purposeful movement;Responds to commands (10/18/17 1115) LLE Sensation: Numbness;Tingling (10/18/17 1115) RLE Motor Response: Purposeful movement;Responds to commands (10/18/17 1115) RLE Sensation: Numbness;Tingling (10/18/17 1115) L Sensory Level: L4-Anterior knee, lower leg (10/18/17 1115) R Sensory Level: L4-Anterior knee, lower leg (10/18/17 1115)  Tyyonna Soucy S

## 2017-10-18 NOTE — Anesthesia Procedure Notes (Signed)
Spinal  Patient location during procedure: OR Start time: 10/18/2017 8:28 AM End time: 10/18/2017 8:33 AM Reason for block: at surgeon's request Staffing Resident/CRNA: Anne Fu, CRNA Performed: resident/CRNA  Preanesthetic Checklist Completed: patient identified, site marked, surgical consent, pre-op evaluation, timeout performed, IV checked, risks and benefits discussed and monitors and equipment checked Spinal Block Patient position: sitting Prep: DuraPrep Patient monitoring: heart rate, continuous pulse ox and blood pressure Approach: midline Location: L2-3 Injection technique: single-shot Needle Needle type: Pencan  Needle gauge: 24 G Needle length: 9 cm Assessment Sensory level: T6 Additional Notes Expiration date of kit checked and confirmed. Patient tolerated procedure well, without complications. X 1 attempt with noted clear CSF return. Loss of motor and sensory on exam post injection.

## 2017-10-18 NOTE — Op Note (Signed)
NAME:  Miranda Marquez                ACCOUNT NO.: [id number]      MEDICAL RECORD NO.: [id number]      FACILITY:  [facility]      PHYSICIAN:  Mauri Pole  DATE OF BIRTH:  03-23-1961     DATE OF PROCEDURE:  10/18/2017                                 OPERATIVE REPORT         PREOPERATIVE DIAGNOSIS: Right  hip osteoarthritis secondary to dysplasia      POSTOPERATIVE DIAGNOSIS:  Right hip osteoarthritis secondary to dysplasia.      PROCEDURE:  Right total hip replacement through an anterior approach   utilizing DePuy THR system, component size 62mm pinnacle cup, a size 36+4 neutral   Altrex liner, a size 2 Hi Tri Lock stem with a 36+5 delta ceramic   ball.      SURGEON:  Pietro Cassis. Alvan Dame, M.D.      ASSISTANT:  Nehemiah Massed, PA-C     ANESTHESIA:  Spinal.      SPECIMENS:  None.      COMPLICATIONS:  None.      BLOOD LOSS:  300 cc     DRAINS:  None.      INDICATION OF THE PROCEDURE:  Miranda Marquez is a 57 y.o. female who had   presented to office for evaluation of right hip pain.  Radiographs revealed   progressive degenerative changes with bone-on-bone   articulation to the  hip joint.  The patient had painful limited range of   motion significantly affecting their overall quality of life.  The patient was failing to    respond to conservative measures, and at this point was ready   to proceed with more definitive measures.  The patient has noted progressive   degenerative changes in his hip, progressive problems and dysfunction   with regarding the hip prior to surgery.  Consent was obtained for   benefit of pain relief.  Specific risk of infection, DVT, component   failure, dislocation, need for revision surgery, as well discussion of   the anterior versus posterior approach were reviewed.  Consent was   obtained for benefit of anterior pain relief through an anterior   approach.      PROCEDURE IN DETAIL:  The patient was brought to operative theater.   Once adequate anesthesia, preoperative antibiotics, 3 gm of Ancef, 1 gm of Tranexamic Acid, and 10 mg of Decadron administered.   The patient was positioned supine on the OSI Hanna table.  Once adequate   padding of boney process was carried out, we had predraped out the hip, and  used fluoroscopy to confirm orientation of the pelvis and position.      The right hip was then prepped and draped from proximal iliac crest to   mid thigh with shower curtain technique.      Time-out was performed identifying the patient, planned procedure, and   extremity.     An incision was then made 2 cm distal and lateral to the   anterior superior iliac spine extending over the orientation of the   tensor fascia lata muscle and sharp dissection was carried down to the   fascia of the muscle and protractor placed in the soft tissues.  The fascia was then incised.  The muscle belly was identified and swept   laterally and retractor placed along the superior neck.  Following   cauterization of the circumflex vessels and removing some pericapsular   fat, a second cobra retractor was placed on the inferior neck.  A third   retractor was placed on the anterior acetabulum after elevating the   anterior rectus.  A L-capsulotomy was along the line of the   superior neck to the trochanteric fossa, then extended proximally and   distally.  Tag sutures were placed and the retractors were then placed   intracapsular.  We then identified the trochanteric fossa and   orientation of my neck cut, confirmed this radiographically   and then made a neck osteotomy with the femur on traction.  The femoral   head was removed without difficulty or complication.  Traction was let   off and retractors were placed posterior and anterior around the   acetabulum.      The labrum and foveal tissue were debrided.  I began reaming with a 54mm   reamer and reamed up to 66mm reamer with good bony bed preparation and a 72mm   cup  was chosen.  The final 51mm Pinnacle cup was then impacted under fluoroscopy  to confirm the depth of penetration and orientation with respect to   abduction.  A screw was placed followed by the hole eliminator.  The final   36+4 neutral Altrex liner was impacted with good visualized rim fit.  The cup was positioned anatomically within the acetabular portion of the pelvis.      At this point, the femur was rolled at 80 degrees.  Further capsule was   released off the inferior aspect of the femoral neck.  I then   released the superior capsule proximally.  The hook was placed laterally   along the femur and elevated manually and held in position with the bed   hook.  The leg was then extended and adducted with the leg rolled to 100   degrees of external rotation.  Once the proximal femur was fully   exposed, I used a box osteotome to set orientation.  I then began   broaching with the starting chili pepper broach and passed this by hand and then broached up to 2.  With the 2 broach in place I chose a high offset neck and did several trial reductions.  The offset was appropriate, leg lengths   appeared to be equal best matched with the +5 head ball versus the +1.5 confirmed radiographically.   Given these findings, I went ahead and dislocated the hip, repositioned all   retractors and positioned the right hip in the extended and abducted position.  The final 2 Hi Tri Lock stem was   chosen and it was impacted down to the level of neck cut.  Based on this   and the trial reduction, a 36+5 delta ceramic ball was chosen and   impacted onto a clean and dry trunnion, and the hip was reduced.  The   hip had been irrigated throughout the case again at this point.  I did   reapproximate the superior capsular leaflet to the anterior leaflet   using #1 Vicryl.  The fascia of the   tensor fascia lata muscle was then reapproximated using #1 Vicryl and #0 Stratafix sutures.  The   remaining wound was closed  with 2-0 Vicryl and running 4-0 Monocryl.  The hip was cleaned, dried, and dressed sterilely using Dermabond and   Aquacel dressing.  She was then brought   to recovery room in stable condition tolerating the procedure well.    Nehemiah Massed, PA-C was present for the entirety of the case involved from   preoperative positioning, perioperative retractor management, general   facilitation of the case, as well as primary wound closure as assistant.            Pietro Cassis Alvan Dame, M.D.        10/18/2017 10:01 AM

## 2017-10-19 LAB — BASIC METABOLIC PANEL
ANION GAP: 7 (ref 5–15)
BUN: 16 mg/dL (ref 6–20)
CALCIUM: 8.6 mg/dL — AB (ref 8.9–10.3)
CO2: 25 mmol/L (ref 22–32)
Chloride: 107 mmol/L (ref 101–111)
Creatinine, Ser: 0.71 mg/dL (ref 0.44–1.00)
GLUCOSE: 142 mg/dL — AB (ref 65–99)
POTASSIUM: 3.7 mmol/L (ref 3.5–5.1)
SODIUM: 139 mmol/L (ref 135–145)

## 2017-10-19 LAB — CBC
HCT: 34.4 % — ABNORMAL LOW (ref 36.0–46.0)
Hemoglobin: 11.8 g/dL — ABNORMAL LOW (ref 12.0–15.0)
MCH: 30.8 pg (ref 26.0–34.0)
MCHC: 34.3 g/dL (ref 30.0–36.0)
MCV: 89.8 fL (ref 78.0–100.0)
PLATELETS: 212 10*3/uL (ref 150–400)
RBC: 3.83 MIL/uL — AB (ref 3.87–5.11)
RDW: 13.7 % (ref 11.5–15.5)
WBC: 9.1 10*3/uL (ref 4.0–10.5)

## 2017-10-19 MED ORDER — SODIUM CHLORIDE 0.9 % IV BOLUS (SEPSIS)
500.0000 mL | Freq: Once | INTRAVENOUS | Status: AC
  Administered 2017-10-19: 500 mL via INTRAVENOUS

## 2017-10-19 NOTE — Discharge Summary (Signed)
Physician Discharge Summary  Patient ID: Miranda Marquez MRN: [id number] DOB/AGE: 1961/06/06 57 y.o.  Admit date: 10/18/2017 Discharge date:  10/19/2017   Procedures:  Procedure(s) (LRB): RIGHT TOTAL HIP ARTHROPLASTY ANTERIOR APPROACH (Right)  Attending Physician:  Dr. Paralee Cancel   Admission Diagnoses:    Right hip primary OA / pain  Discharge Diagnoses:  Principal Problem:   S/P right THA, AA Active Problems:   Obesity  Past Medical History:  Diagnosis Date  . Arthritis   . Cervical herniated disc   . Complication of anesthesia    hypotension  in april after hysterectomy  . Gestational diabetes    history..back 21 plus yrs ago.  Now she is ok  . Hypertension    not taking meds   . Pituitary microadenoma with hyperprolactinemia ([place])   . Psoriasis both elbows, scalp, left thigh, saw dr Allyson Sabal, last week given cream for areas healing  . Sleep apnea    never had study done, but things she does have it    HPI:    Miranda Marquez, 57 y.o. female, has a history of pain and functional disability in the right hip(s) due to arthritis and patient has failed non-surgical conservative treatments for greater than 12 weeks to include NSAID's and/or analgesics, use of assistive devices and activity modification.  Onset of symptoms was gradual starting 2.5 years ago with gradually worsening course since that time.The patient noted no past surgery on the right hip(s).  Patient currently rates pain in the right hip at 10 out of 10 with activity. Patient has night pain, worsening of pain with activity and weight bearing, trendelenberg gait, pain that interfers with activities of daily living and pain with passive range of motion. Patient has evidence of periarticular osteophytes and joint space narrowing by imaging studies. This condition presents safety issues increasing the risk of falls.  There is no current active infection.  Risks, benefits and expectations were discussed with the patient.   Risks including but not limited to the risk of anesthesia, blood clots, nerve damage, blood vessel damage, failure of the prosthesis, infection and up to and including death.  Patient understand the risks, benefits and expectations and wishes to proceed with surgery.   PCP: Vania Rea, MD   Discharged Condition: good  Hospital Course:  Patient underwent the above stated procedure on 10/18/2017. Patient tolerated the procedure well and brought to the recovery room in good condition and subsequently to the floor.  POD #1 BP: 118/73 ; Pulse: 73 ; Temp: 98.5 F (36.9 C) ; Resp: 14 Patient reports pain as mild, pain controlled.  No events throughout the night. States that the pain that she had prior to surgery is gone, she feels better than she has in 3 years.  Looking forward to working with PT.  Ready to be discharged home. Dorsiflexion/plantar flexion intact, incision: dressing C/D/I, no cellulitis present and compartment soft.   LABS  Basename    HGB     11.8  HCT     34.4    Discharge Exam: General appearance: alert, cooperative and no distress Extremities: Homans sign is negative, no sign of DVT, no edema, redness or tenderness in the calves or thighs and no ulcers, gangrene or trophic changes  Disposition: Home with follow up in 2 weeks   Follow-up Information    Paralee Cancel, MD. Schedule an appointment as soon as possible for a visit in 2 week(s).   Specialty:  Orthopedic Surgery Contact information: [address] [place] [address] [phone]           Discharge Instructions    Call MD / Call 911   Complete by:  As directed    If you experience chest pain or shortness of breath, CALL 911 and be transported to the hospital emergency room.  If you develope a fever above 101 F, pus (white drainage) or increased drainage or redness at the wound, or calf pain, call your surgeon's office.   Change dressing   Complete by:  As directed    Maintain  surgical dressing until follow up in the clinic. If the edges start to pull up, may reinforce with tape. If the dressing is no longer working, may remove and cover with gauze and tape, but must keep the area dry and clean.  Call with any questions or concerns.   Constipation Prevention   Complete by:  As directed    Drink plenty of fluids.  Prune juice may be helpful.  You may use a stool softener, such as Colace (over the counter) 100 mg twice a day.  Use MiraLax (over the counter) for constipation as needed.   Diet - low sodium heart healthy   Complete by:  As directed    Discharge instructions   Complete by:  As directed    Maintain surgical dressing until follow up in the clinic. If the edges start to pull up, may reinforce with tape. If the dressing is no longer working, may remove and cover with gauze and tape, but must keep the area dry and clean.  Follow up in 2 weeks at [facility] Call with any questions or concerns.   Increase activity slowly as tolerated   Complete by:  As directed    Weight bearing as tolerated with assist device (walker, cane, etc) as directed, use it as long as suggested by your surgeon or therapist, typically at least 4-6 weeks.   TED hose   Complete by:  As directed    Use stockings (TED hose) for 2 weeks on both leg(s).  You may remove them at night for sleeping.      Allergies as of 10/19/2017   No Known Allergies     Medication List    STOP taking these medications   DUEXIS 800-26.6 MG Tabs Generic drug:  Ibuprofen-Famotidine   hydrochlorothiazide 25 MG tablet Commonly known as:  HYDRODIURIL     TAKE these medications   aspirin 81 MG chewable tablet Commonly known as:  ASPIRIN CHILDRENS Chew 1 tablet (81 mg total) by mouth 2 (two) times daily. Take for 4 weeks, then resume regular dose.   bromocriptine 2.5 MG tablet Commonly known as:  PARLODEL Take 5 mg by mouth daily.   docusate sodium 100 MG capsule Commonly known as:   COLACE Take 1 capsule (100 mg total) by mouth 2 (two) times daily.   ferrous sulfate 325 (65 FE) MG tablet Commonly known as:  FERROUSUL Take 1 tablet (325 mg total) by mouth 3 (three) times daily with meals.   HYDROcodone-acetaminophen 7.5-325 MG tablet Commonly known as:  NORCO Take 1-2 tablets by mouth every 4 (four) hours as needed for moderate pain.   methocarbamol 500 MG tablet Commonly known as:  ROBAXIN Take 1 tablet (500 mg total) by mouth every 6 (six) hours as needed for muscle spasms.   polyethylene glycol packet Commonly known as:  MIRALAX / GLYCOLAX Take 17 g by mouth 2 (two) times daily.  Discharge Care Instructions  (From admission, onward)        Start     Ordered   10/19/17 0000  Change dressing    Comments:  Maintain surgical dressing until follow up in the clinic. If the edges start to pull up, may reinforce with tape. If the dressing is no longer working, may remove and cover with gauze and tape, but must keep the area dry and clean.  Call with any questions or concerns.   10/19/17 9470       Signed: [place] Pugh. Rehan Holness   PA-C  10/19/2017, 8:20 AM

## 2017-10-19 NOTE — Progress Notes (Signed)
Physical Therapy Treatment Patient Details Name: Miranda Marquez MRN: [id number] DOB: 1961-03-16 Today's Date: 10/19/2017    History of Present Illness R THA, h/o PLIF L-4-L4 January 2018    PT Comments    Pt very motivated, progressing well with mobility and eager for dc home this date.  Will follow up this pm for HEP and stair training.   Follow Up Recommendations  Follow surgeon's recommendation for DC plan and follow-up therapies     Equipment Recommendations  Rolling walker with 5" wheels;3in1 (PT)    Recommendations for Other Services OT consult     Precautions / Restrictions Precautions Precautions: Fall Restrictions Weight Bearing Restrictions: No Other Position/Activity Restrictions: WBAT    Mobility  Bed Mobility               General bed mobility comments: Pt up in chair and requests back to same  Transfers Overall transfer level: Needs assistance Equipment used: Rolling walker (2 wheeled) Transfers: Sit to/from Stand Sit to Stand: Min guard         General transfer comment: VCs hand placement  Ambulation/Gait Ambulation/Gait assistance: Min guard Ambulation Distance (Feet): 250 Feet Assistive device: 4-wheeled walker Gait Pattern/deviations: Step-through pattern;Decreased step length - right;Decreased step length - left;Shuffle;Trunk flexed Gait velocity: decr Gait velocity interpretation: Below normal speed for age/gender General Gait Details: cues for posture, position from RW and to ER on R LE   Stairs            Wheelchair Mobility    Modified Rankin (Stroke Patients Only)       Balance Overall balance assessment: No apparent balance deficits (not formally assessed)                                          Cognition Arousal/Alertness: Awake/alert Behavior During Therapy: WFL for tasks assessed/performed Overall Cognitive Status: Within Functional Limits for tasks assessed                                        Exercises Total Joint Exercises Ankle Circles/Pumps: AROM;Both;Supine;20 reps Quad Sets: AROM;Both;Supine;10 reps Heel Slides: AAROM;Right;Supine;20 reps Hip ABduction/ADduction: AAROM;Right;Supine;15 reps Long Arc Quad: AROM;Right;10 reps;Seated    General Comments        Pertinent Vitals/Pain Pain Assessment: 0-10 Pain Score: 3  Faces Pain Scale: Hurts little more Pain Location: R hip Pain Descriptors / Indicators: Sore;Tender Pain Intervention(s): Limited activity within patient's tolerance;Monitored during session;Premedicated before session;Ice applied    Home Living Family/patient expects to be discharged to:: Private residence Living Arrangements: Spouse/significant other Available Help at Discharge: Family;Available 24 hours/day Type of Home: House Home Access: Stairs to enter Entrance Stairs-Rails: Left;Right Home Layout: Able to live on main level with bedroom/bathroom Home Equipment: Cane - single point;Walker - 4 wheels;Bedside commode;Adaptive equipment      Prior Function Level of Independence: Independent with assistive device(s)      Comments: walked with 2 canes   PT Goals (current goals can now be found in the care plan section) Acute Rehab PT Goals Patient Stated Goal: to walk PT Goal Formulation: With patient Time For Goal Achievement: 10/25/17 Potential to Achieve Goals: Good Progress towards PT goals: Progressing toward goals    Frequency    7X/week      PT Plan Current plan  remains appropriate    Co-evaluation              AM-PAC PT "6 Clicks" Daily Activity  Outcome Measure  Difficulty turning over in bed (including adjusting bedclothes, sheets and blankets)?: A Little Difficulty moving from lying on back to sitting on the side of the bed? : Unable Difficulty sitting down on and standing up from a chair with arms (e.g., wheelchair, bedside commode, etc,.)?: Unable Help needed moving to and from a bed to  chair (including a wheelchair)?: A Little Help needed walking in hospital room?: A Little Help needed climbing 3-5 steps with a railing? : A Little 6 Click Score: 14    End of Session Equipment Utilized During Treatment: Gait belt Activity Tolerance: Patient tolerated treatment well Patient left: in chair;with call bell/phone within reach Nurse Communication: Mobility status PT Visit Diagnosis: Difficulty in walking, not elsewhere classified (R26.2)     Time: 2542-7062 PT Time Calculation (min) (ACUTE ONLY): 29 min  Charges:  $Gait Training: 8-22 mins $Therapeutic Exercise: 8-22 mins                    G Codes:       Pg [phone]    Yoon Barca 10/19/2017, 10:51 AM

## 2017-10-19 NOTE — Progress Notes (Signed)
Physical Therapy Treatment Patient Details Name: Miranda Marquez MRN: [id number] DOB: [birth year] Today's Date: 10/19/2017    History of Present Illness R THA, h/o PLIF L-4-L4 January 2018    PT Comments    Pt progressing well with mobility and eager for return home.  Reviewed stairs, car transfers and home therex with written instructions provided.   Follow Up Recommendations  Follow surgeon's recommendation for DC plan and follow-up therapies     Equipment Recommendations  Rolling walker with 5" wheels;3in1 (PT)    Recommendations for Other Services OT consult     Precautions / Restrictions Precautions Precautions: Fall Restrictions Weight Bearing Restrictions: No Other Position/Activity Restrictions: WBAT    Mobility  Bed Mobility               General bed mobility comments: Pt up in chair and requests back to same  Transfers Overall transfer level: Needs assistance Equipment used: Rolling walker (2 wheeled) Transfers: Sit to/from Stand Sit to Stand: Min guard;Supervision         General transfer comment: VCs hand placement  Ambulation/Gait Ambulation/Gait assistance: Min guard;Supervision Ambulation Distance (Feet): 222 Feet Assistive device: 4-wheeled walker Gait Pattern/deviations: Step-through pattern;Decreased step length - right;Decreased step length - left;Shuffle;Trunk flexed Gait velocity: decr Gait velocity interpretation: Below normal speed for age/gender General Gait Details: min cues for posture, position from RW and to ER on R LE   Stairs Stairs: Yes   Stair Management: One rail Left;Forwards;With cane;Step to pattern Number of Stairs: 5 General stair comments: cues for sequence and foot/cane placement  Wheelchair Mobility    Modified Rankin (Stroke Patients Only)       Balance Overall balance assessment: No apparent balance deficits (not formally assessed)                                          Cognition  Arousal/Alertness: Awake/alert Behavior During Therapy: WFL for tasks assessed/performed Overall Cognitive Status: Within Functional Limits for tasks assessed                                        Exercises Total Joint Exercises Ankle Circles/Pumps: AROM;Both;Supine;20 reps Quad Sets: AROM;Both;Supine;10 reps Heel Slides: AAROM;Right;Supine;20 reps Hip ABduction/ADduction: AAROM;Right;Supine;15 reps Long Arc Quad: AROM;Right;10 reps;Seated    General Comments        Pertinent Vitals/Pain Pain Assessment: 0-10 Pain Score: 3  Pain Location: R hip Pain Descriptors / Indicators: Sore;Tender Pain Intervention(s): Limited activity within patient's tolerance;Monitored during session;Premedicated before session;Ice applied    Home Living                      Prior Function            PT Goals (current goals can now be found in the care plan section) Acute Rehab PT Goals Patient Stated Goal: to walk PT Goal Formulation: With patient Time For Goal Achievement: 10/25/17 Potential to Achieve Goals: Good Progress towards PT goals: Progressing toward goals    Frequency    7X/week      PT Plan Current plan remains appropriate    Co-evaluation              AM-PAC PT "6 Clicks" Daily Activity  Outcome Measure  Difficulty turning over in bed (including adjusting  bedclothes, sheets and blankets)?: A Little Difficulty moving from lying on back to sitting on the side of the bed? : Unable Difficulty sitting down on and standing up from a chair with arms (e.g., wheelchair, bedside commode, etc,.)?: Unable Help needed moving to and from a bed to chair (including a wheelchair)?: A Little Help needed walking in hospital room?: A Little Help needed climbing 3-5 steps with a railing? : A Little 6 Click Score: 14    End of Session Equipment Utilized During Treatment: Gait belt Activity Tolerance: Patient tolerated treatment well Patient left: in  chair;with call bell/phone within reach Nurse Communication: Mobility status PT Visit Diagnosis: Difficulty in walking, not elsewhere classified (R26.2)     Time: 1335-1420 PT Time Calculation (min) (ACUTE ONLY): 45 min  Charges:  $Gait Training: 8-22 mins $Therapeutic Exercise: 8-22 mins $Therapeutic Activity: 8-22 mins                    G Codes:       Pg [phone]    Jaqwan Wieber 10/19/2017, 2:41 PM

## 2017-10-19 NOTE — Progress Notes (Signed)
     Subjective: 1 Day Post-Op Procedure(s) (LRB): RIGHT TOTAL HIP ARTHROPLASTY ANTERIOR APPROACH (Right)   Patient reports pain as mild, pain controlled.  No events throughout the night. States that the pain that she had prior to surgery is gone, she feels better than she has in 3 years.  Looking forward to working with PT.  Ready to be discharged home.   Objective:   VITALS:   Vitals:   10/19/17 0113 10/19/17 0510  BP: 139/74 118/73  Pulse: 72 73  Resp: 15 14  Temp: 98.8 F (37.1 C) 98.5 F (36.9 C)  SpO2: 96% 98%    Dorsiflexion/Plantar flexion intact Incision: dressing C/D/I No cellulitis present Compartment soft  LABS Recent Labs    10/19/17 0548  HGB 11.8*  HCT 34.4*  WBC 9.1  PLT 212    Recent Labs    10/19/17 0548  NA 139  K 3.7  BUN 16  CREATININE 0.71  GLUCOSE 142*     Assessment/Plan: 1 Day Post-Op Procedure(s) (LRB): RIGHT TOTAL HIP ARTHROPLASTY ANTERIOR APPROACH (Right) Foley cath d/c'ed Advance diet Up with therapy D/C IV fluids Discharge home Follow up in 2 weeks at [facility]. Follow up with OLIN,Charlis Harner D in 2 weeks.  Contact information:  [facility] [address], [place] [phone]    Obese (BMI 30-39.9) Estimated body mass index is 39.72 kg/m as calculated from the following:   Height as of this encounter: 5\' 9"  (1.753 m).   Weight as of this encounter: 122 kg (269 lb). Patient also counseled that weight may inhibit the healing process Patient counseled that losing weight will help with future health issues         [place] Pugh. Caela Huot   PAC  10/19/2017, 8:10 AM

## 2017-10-19 NOTE — Evaluation (Signed)
Occupational Therapy Evaluation Patient Details Name: Miranda Marquez MRN: [id number] DOB: 11/06/1960 Today's Date: 10/19/2017    History of Present Illness R THA, h/o PLIF L-4-L4 January 2018   Clinical Impression   All education completed with pt verbalizing understanding. She has all necessary DME and AE. No further OT needs.    Follow Up Recommendations  No OT follow up    Equipment Recommendations  None recommended by OT    Recommendations for Other Services       Precautions / Restrictions Precautions Precautions: Fall Restrictions Weight Bearing Restrictions: No Other Position/Activity Restrictions: WBAT      Mobility Bed Mobility               General bed mobility comments: pt received in chair  Transfers Overall transfer level: Needs assistance Equipment used: Rolling walker (2 wheeled) Transfers: Sit to/from Stand Sit to Stand: Min guard         General transfer comment: VCs hand placement    Balance                                           ADL either performed or assessed with clinical judgement   ADL Overall ADL's : Needs assistance/impaired Eating/Feeding: Independent;Sitting   Grooming: Min guard;Standing;Oral care   Upper Body Bathing: Set up;Sitting   Lower Body Bathing: Minimal assistance;Sit to/from stand   Upper Body Dressing : Set up;Sitting   Lower Body Dressing: Min guard;Sit to/from stand;With adaptive equipment   Toilet Transfer: Min guard;Ambulation;RW;BSC   Toileting- Water quality scientist and Hygiene: Min guard;Sit to/from stand       Functional mobility during ADLs: Min guard;Rolling walker General ADL Comments: educated in safe footwear and transporting items safely with RW     Vision Baseline Vision/History: Wears glasses Wears Glasses: Reading only Patient Visual Report: No change from baseline       Perception     Praxis      Pertinent Vitals/Pain Pain Assessment: Faces Faces  Pain Scale: Hurts little more Pain Location: R hip Pain Descriptors / Indicators: Sore;Tender Pain Intervention(s): Monitored during session     Hand Dominance Right   Extremity/Trunk Assessment Upper Extremity Assessment Upper Extremity Assessment: Overall WFL for tasks assessed   Lower Extremity Assessment Lower Extremity Assessment: Defer to PT evaluation   Cervical / Trunk Assessment Cervical / Trunk Assessment: Normal(hx of back sx)   Communication Communication Communication: No difficulties   Cognition Arousal/Alertness: Awake/alert Behavior During Therapy: WFL for tasks assessed/performed Overall Cognitive Status: Within Functional Limits for tasks assessed                                     General Comments       Exercises     Shoulder Instructions      Home Living Family/patient expects to be discharged to:: Private residence Living Arrangements: Spouse/significant other Available Help at Discharge: Family;Available 24 hours/day Type of Home: House Home Access: Stairs to enter [employer] of Steps: 4 Entrance Stairs-Rails: Left;Right Home Layout: Able to live on main level with bedroom/bathroom     Bathroom Shower/Tub: Occupational psychologist: Standard     Home Equipment: Cane - single point;Walker - 4 wheels;Bedside commode;Adaptive equipment Adaptive Equipment: Reacher;Sock aid;Long-handled shoe horn;Long-handled sponge  Prior Functioning/Environment Level of Independence: Independent with assistive device(s)        Comments: walked with 2 canes        OT Problem List: Impaired balance (sitting and/or standing)      OT Treatment/Interventions:      OT Goals(Current goals can be found in the care plan section) Acute Rehab OT Goals Patient Stated Goal: to walk  OT Frequency:     Barriers to D/C:            Co-evaluation              AM-PAC PT "6 Clicks" Daily Activity      Outcome Measure Help from another person eating meals?: None Help from another person taking care of personal grooming?: A Little Help from another person toileting, which includes using toliet, bedpan, or urinal?: A Little Help from another person bathing (including washing, rinsing, drying)?: A Little Help from another person to put on and taking off regular upper body clothing?: None Help from another person to put on and taking off regular lower body clothing?: A Little 6 Click Score: 20   End of Session    Activity Tolerance: Patient tolerated treatment well Patient left: in chair;with call bell/phone within reach  OT Visit Diagnosis: Other abnormalities of gait and mobility (R26.89);Pain                Time: 4008-6761 OT Time Calculation (min): 34 min Charges:  OT General Charges $OT Visit: 1 Visit OT Evaluation $OT Eval Low Complexity: 1 Low OT Treatments $Self Care/Home Management : 8-22 mins G-Codes:     14-Nov-2017 Nestor Lewandowsky, OTR/L Pager: [phone]  Werner Lean, Haze Boyden 11-14-2017, 9:57 AM

## 2017-12-05 IMAGING — CR DG CHEST 2V
2 series · 2 of 2 positions shown · non-contrast
Comparison: Limited views of the chest from a bilateral rib series
of April 17, 2015.

CLINICAL DATA: Preoperative examination prior to PLIF. History of
sleep apnea, hypertension, breast malignancy treated with lumpectomy
and radiation therapy. Former smoker.

EXAM:
CHEST  2 VIEW

[w chest pa]
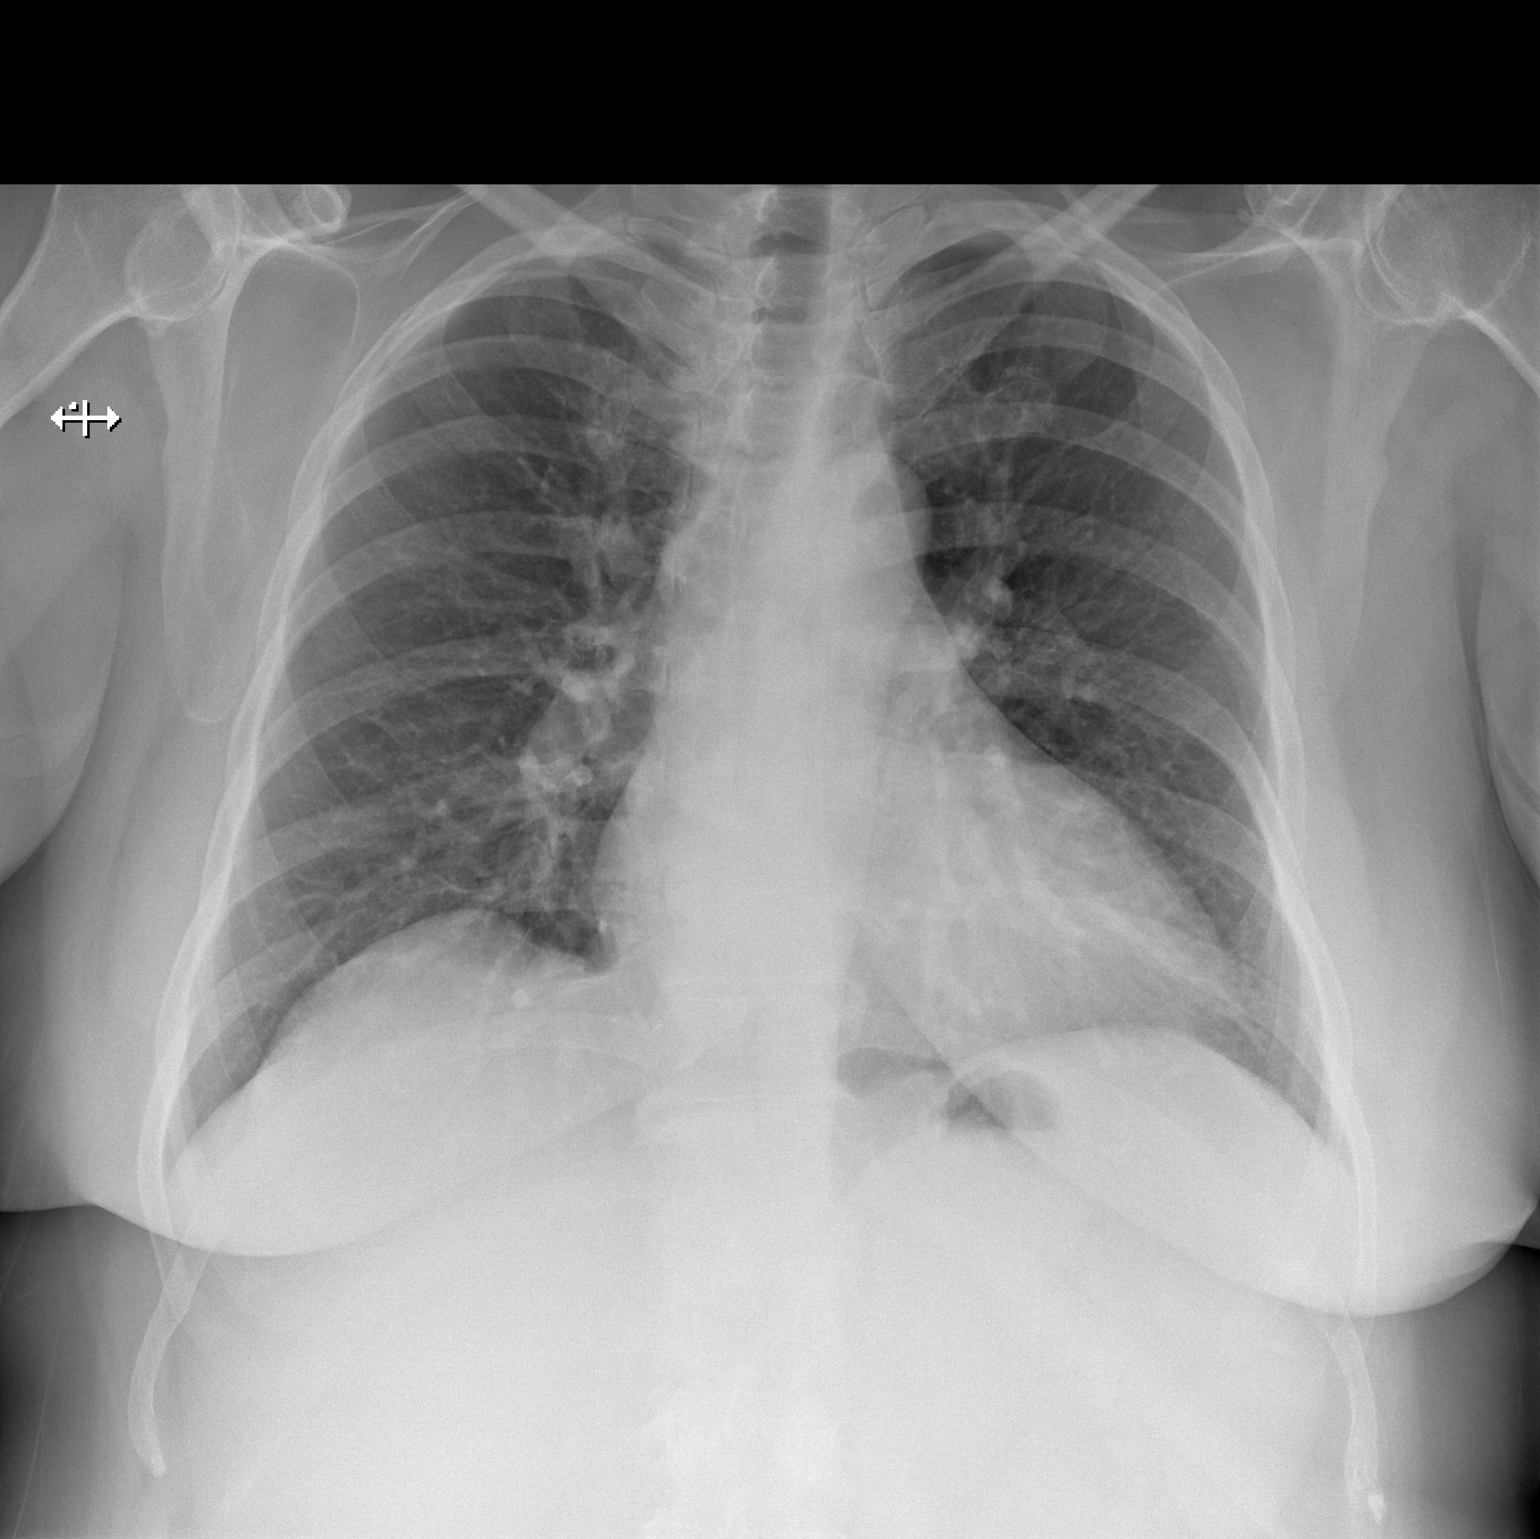

[w chest lat]
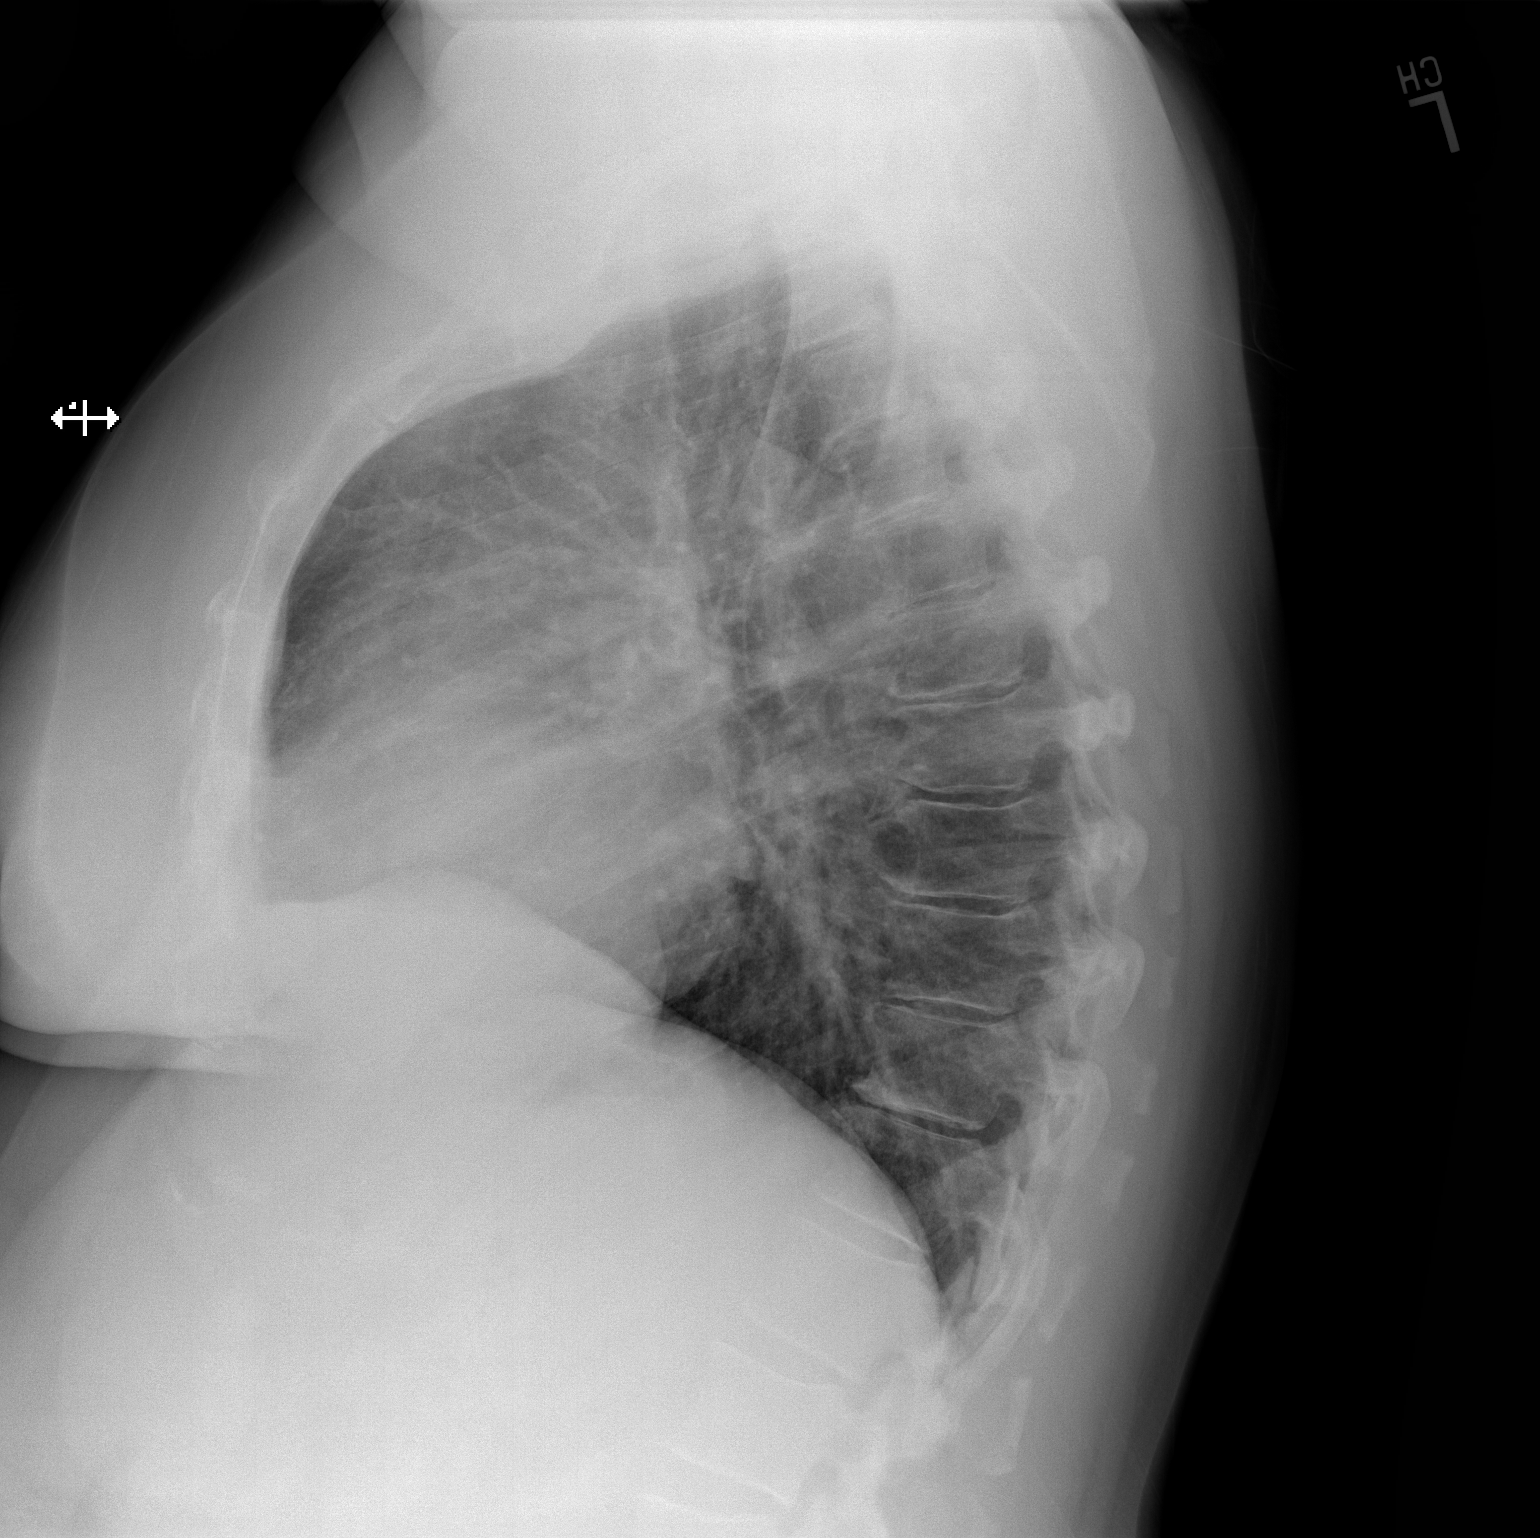

[2 of 2 positions shown; findings below may reference images not displayed]

FINDINGS: The lungs are adequately inflated. The interstitial markings are
coarse but not greatly changed from the previous study. There is no
alveolar infiltrate. There is no pleural effusion. The mediastinum
is normal in width. There is mild multilevel degenerative disc
disease of the thoracic spine.
IMPRESSION: Mild chronic bronchitic changes. No pneumonia, CHF, nor other acute
cardiopulmonary abnormality.

## 2017-12-12 IMAGING — RF DG C-ARM 61-120 MIN
1 series · 3 of 3 positions shown · non-contrast
Comparison: None.

FLUOROSCOPY TIME:  1 minutes 31 seconds

CLINICAL DATA: Status post posterior lumbar interbody fusion

EXAM:
LUMBAR SPINE - 2-3 VIEW; DG C-ARM 61-120 MIN

[Series 1: run · 3 of 3 slices shown]
[im 1/3]
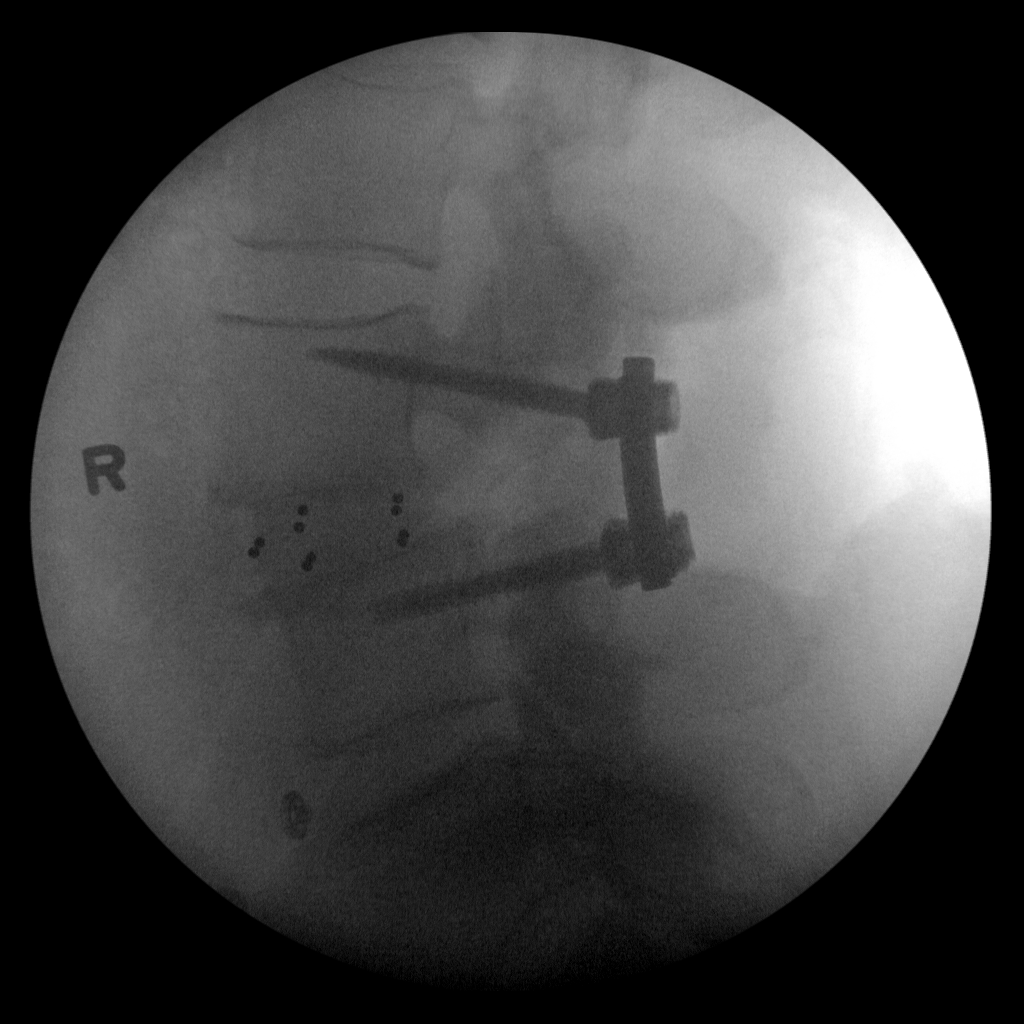
[im 2/3]
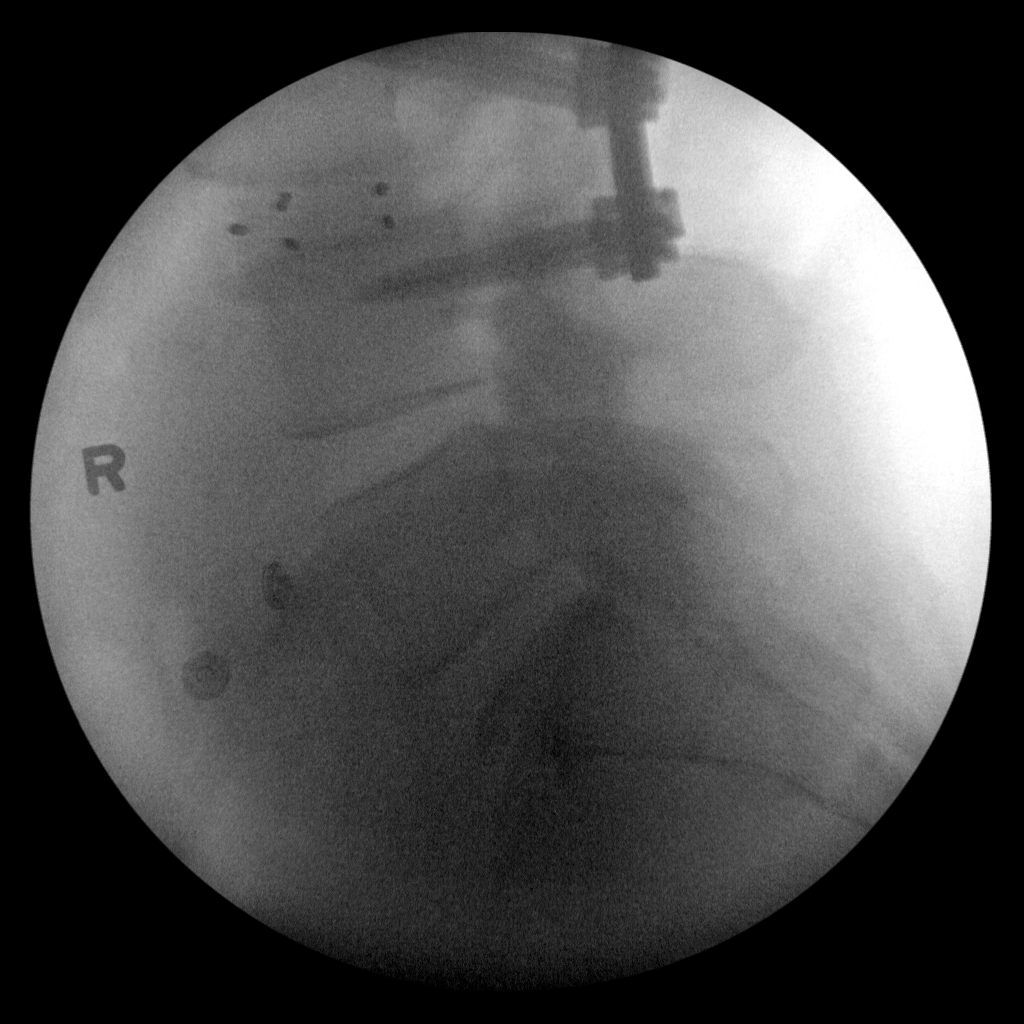
[im 3/3]
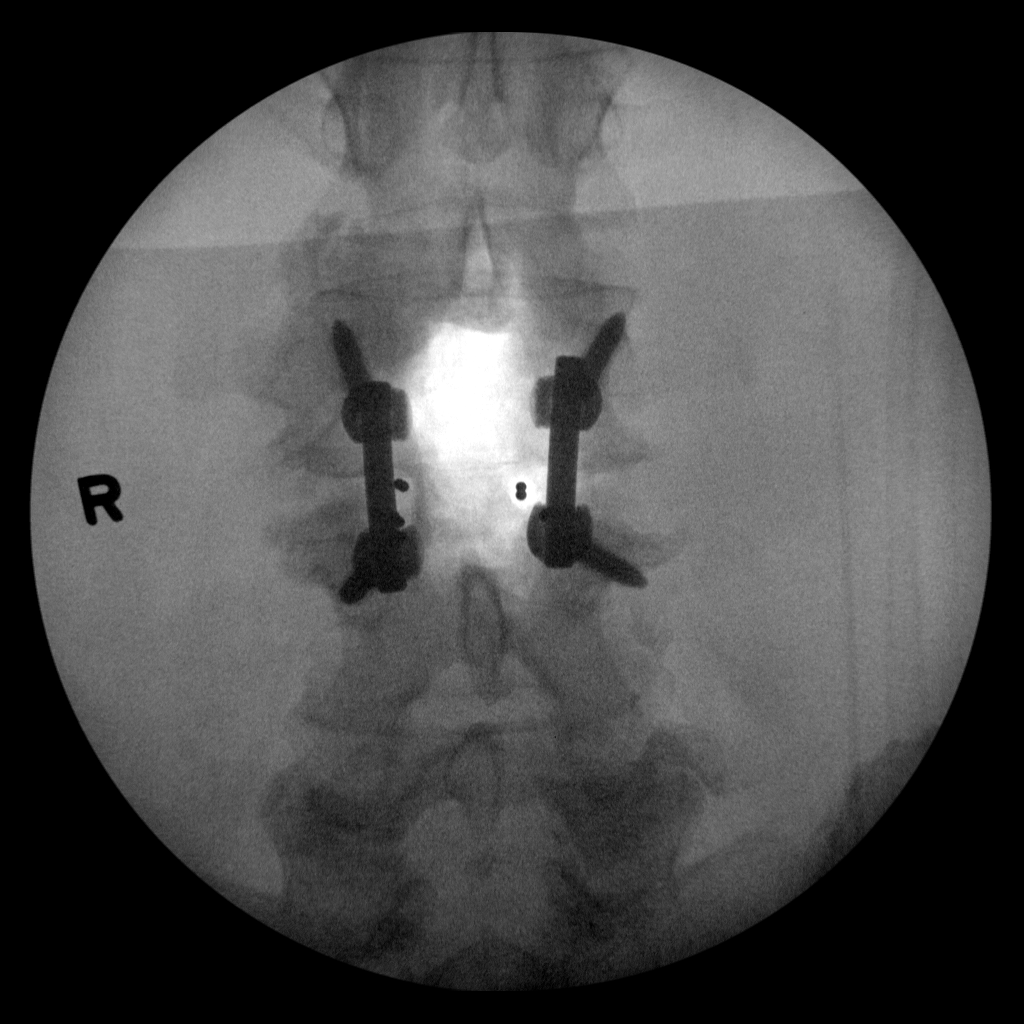

[3 of 3 positions shown; findings below may reference images not displayed]

FINDINGS: Posterior lumbar interbody fusion at L3-4. Persistent stable grade 1
anterolisthesis of L3 on L4.
IMPRESSION: PLIF L3-4.

## 2017-12-14 DIAGNOSIS — Z471 Aftercare following joint replacement surgery: Secondary | ICD-10-CM | POA: Diagnosis not present

## 2017-12-14 DIAGNOSIS — Z96641 Presence of right artificial hip joint: Secondary | ICD-10-CM | POA: Diagnosis not present

## 2018-07-05 ENCOUNTER — Encounter: Payer: Self-pay | Admitting: Cardiology

## 2018-08-04 ENCOUNTER — Ambulatory Visit: Payer: BLUE CROSS/BLUE SHIELD | Admitting: Cardiology

## 2020-05-23 ENCOUNTER — Other Ambulatory Visit: Payer: Self-pay | Admitting: Obstetrics & Gynecology

## 2020-05-23 DIAGNOSIS — N649 Disorder of breast, unspecified: Secondary | ICD-10-CM

## 2020-06-13 ENCOUNTER — Ambulatory Visit
Admission: RE | Admit: 2020-06-13 | Discharge: 2020-06-13 | Disposition: A | Discharge status: Home / Self Care | Payer: BLUE CROSS/BLUE SHIELD | Source: Ambulatory Visit | Attending: Obstetrics & Gynecology | Admitting: Obstetrics & Gynecology

## 2020-06-13 ENCOUNTER — Other Ambulatory Visit: Payer: Self-pay | Admitting: Obstetrics & Gynecology

## 2020-06-13 ENCOUNTER — Other Ambulatory Visit: Payer: Self-pay

## 2020-06-13 DIAGNOSIS — N649 Disorder of breast, unspecified: Secondary | ICD-10-CM

## 2020-06-13 DIAGNOSIS — R928 Other abnormal and inconclusive findings on diagnostic imaging of breast: Secondary | ICD-10-CM | POA: Diagnosis not present

## 2020-06-13 DIAGNOSIS — N6322 Unspecified lump in the left breast, upper inner quadrant: Secondary | ICD-10-CM | POA: Diagnosis not present

## 2020-06-19 ENCOUNTER — Ambulatory Visit
Admission: RE | Admit: 2020-06-19 | Discharge: 2020-06-19 | Disposition: A | Discharge status: Home / Self Care | Payer: BLUE CROSS/BLUE SHIELD | Source: Ambulatory Visit | Attending: Obstetrics & Gynecology | Admitting: Obstetrics & Gynecology

## 2020-06-19 ENCOUNTER — Other Ambulatory Visit: Payer: BLUE CROSS/BLUE SHIELD

## 2020-06-19 ENCOUNTER — Other Ambulatory Visit: Payer: Self-pay

## 2020-06-19 DIAGNOSIS — N6342 Unspecified lump in left breast, subareolar: Secondary | ICD-10-CM | POA: Diagnosis not present

## 2020-06-19 DIAGNOSIS — R928 Other abnormal and inconclusive findings on diagnostic imaging of breast: Secondary | ICD-10-CM | POA: Diagnosis not present

## 2020-06-19 DIAGNOSIS — N649 Disorder of breast, unspecified: Secondary | ICD-10-CM

## 2020-06-19 DIAGNOSIS — N6042 Mammary duct ectasia of left breast: Secondary | ICD-10-CM | POA: Diagnosis not present

## 2020-06-22 DIAGNOSIS — Z20822 Contact with and (suspected) exposure to covid-19: Secondary | ICD-10-CM | POA: Diagnosis not present

## 2020-08-07 DIAGNOSIS — Z6839 Body mass index (BMI) 39.0-39.9, adult: Secondary | ICD-10-CM | POA: Diagnosis not present

## 2020-08-07 DIAGNOSIS — Z01419 Encounter for gynecological examination (general) (routine) without abnormal findings: Secondary | ICD-10-CM | POA: Diagnosis not present

## 2020-08-07 DIAGNOSIS — D352 Benign neoplasm of pituitary gland: Secondary | ICD-10-CM | POA: Diagnosis not present

## 2020-08-07 DIAGNOSIS — Z0000 Encounter for general adult medical examination without abnormal findings: Secondary | ICD-10-CM | POA: Diagnosis not present

## 2020-11-19 ENCOUNTER — Encounter: Payer: Self-pay | Admitting: Cardiology

## 2020-12-25 ENCOUNTER — Ambulatory Visit: Payer: BLUE CROSS/BLUE SHIELD | Admitting: Cardiology

## 2020-12-30 NOTE — Progress Notes (Deleted)
Cardiology Office Note:    Date:  12/30/2020   ID:  Miranda Marquez, DOB [birth year], MRN [id number]  PCP:  Vania Rea, MD   [place] [facility] Cardiologist:  Ena Dawley, MD (Inactive) {  Referring MD: Vania Rea, MD    History of Present Illness:    Miranda Marquez is a 60 y.o. 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 Meda Coffee in 2017 who now re-presents to clinic for ***.  Last saw Dr. Meda Coffee in 2017 where she was experiencing dyspnea on exertion. TTE with LVEF 55-60%, G1DD, mild ascending aortic aneurysm 33mm. Myoview negative for ischemia. She was lost to follow-up.  Today,  Past Medical History:  Diagnosis Date  . Arthritis   . Cervical herniated disc   . Complication of anesthesia    hypotension  in april after hysterectomy  . Gestational diabetes    history..back 21 plus yrs ago.  Now she is ok  . Hypertension    not taking meds   . Pituitary microadenoma with hyperprolactinemia ([place])   . Psoriasis both elbows, scalp, left thigh, saw dr Allyson Sabal, last week given cream for areas healing  . Sleep apnea    never had study done, but things she does have it    Past Surgical History:  Procedure Laterality Date  . ABDOMINAL HYSTERECTOMY    . APPENDECTOMY    . BREAST BIOPSY Right 10/11/2014  . BREAST EXCISIONAL BIOPSY Right 07/2015  . BREAST EXCISIONAL BIOPSY Right   . BREAST LUMPECTOMY WITH RADIOACTIVE SEED LOCALIZATION Right 08/07/2015   Procedure: RIGHT BREAST LUMPECTOMY WITH RADIOACTIVE SEED LOCALIZATION AND EXCISION OF BREAST MASS X2;  Surgeon: Coralie Keens, MD;  Location: [facility];  Service: General;  Laterality: Right;  . BREAST SURGERY    . DILATION AND CURETTAGE OF UTERUS    . FOOT SURGERY     left heel    . HERNIA REPAIR     umbilical  . MAXIMUM ACCESS (MAS)POSTERIOR LUMBAR INTERBODY FUSION (PLIF) 1 LEVEL N/A 09/09/2016   Procedure: MAXIMUM ACCESS SURGERY POSTERIOR LUMBAR  INTERBODY FUSION LUMBAR THREE-FOUR;  Surgeon: Eustace Moore, MD;  Location: [facility];  Service: Neurosurgery;  Laterality: N/A;  . MINI-LAPAROTOMY W/ TUBAL LIGATION    . ROBOTIC ASSISTED TOTAL HYSTERECTOMY WITH BILATERAL SALPINGO OOPHERECTOMY Bilateral 12/03/2014   Procedure: ROBOTIC ASSISTED TOTAL HYSTERECTOMY WITH BILATERAL SALPINGO OOPHORECTOMY;  Surgeon: Everitt Amber, MD;  Location: [facility];  Service: Gynecology;  Laterality: Bilateral;  . TOTAL HIP ARTHROPLASTY Right 10/18/2017   Procedure: RIGHT TOTAL HIP ARTHROPLASTY ANTERIOR APPROACH;  Surgeon: Paralee Cancel, MD;  Location: [facility];  Service: Orthopedics;  Laterality: Right;  70 mins  . TUBAL LIGATION      Current Medications: No outpatient medications have been marked as taking for the 01/01/21 encounter (Appointment) with Freada Bergeron, MD.     Allergies:   Patient has no known allergies.   Social History   Socioeconomic History  . Marital status: Married    Spouse name: Not on file  . Number of children: Not on file  . Years of education: Not on file  . Highest education level: Not on file  Occupational History  . Not on file  Tobacco Use  . Smoking status: Former Smoker    Quit date: 08/23/1978    Years since quitting: 42.3  . Smokeless tobacco: Never Used  Vaping Use  . Vaping Use: Never used  Substance  and Sexual Activity  . Alcohol use: Yes    Alcohol/week: 2.0 standard drinks    Types: 2 Glasses of wine per week    Comment: occasional, weekends  . Drug use: No  . Sexual activity: Yes  Other Topics Concern  . Not on file  Social History Narrative  . Not on file   Social Determinants of Health   Financial Resource Strain: Not on file  Food Insecurity: Not on file  Transportation Needs: Not on file  Physical Activity: Not on file  Stress: Not on file  Social Connections: Not on file     Family History: The patient's ***family history includes Hypertension in her father.  ROS:   Please see the history  of present illness.    *** All other systems reviewed and are negative.  EKGs/Labs/Other Studies Reviewed:    The following studies were reviewed today: TTE 12-10-15: Study Conclusions   - Left ventricle: The cavity size was normal. Wall thickness was  increased in a pattern of mild LVH. Systolic function was normal.  The estimated ejection fraction was in the range of 55% to 60%.  Wall motion was normal; there were no regional wall motion  abnormalities. Doppler parameters are consistent with abnormal  left ventricular relaxation (grade 1 diastolic dysfunction).  - Aortic valve: There was no stenosis.  - Aorta: Ascending aortic diameter: 38 mm (S).  - Ascending aorta: The ascending aorta was mildly dilated.  - Mitral valve: There was no significant regurgitation.  - Right ventricle: The cavity size was normal. Systolic function  was normal.  - Pulmonary arteries: No complete TR doppler jet so unable to  estimate PA systolic pressure.   Impressions:   - Normal LV size with mild LV hypertrophy. EF 55-60%. Normal RV  size and systolic function. No significant valvular  abnormalities.   Myoview 2015-12-10:  Nuclear stress EF: 53%.  There was no ST segment deviation noted during stress.  The study is normal.  This is a low risk study.  The left ventricular ejection fraction is normal (55-65%).   Normal pharmacologic nuclear study with no evidence of prior infarct or ischemia.    EKG:  EKG is *** ordered today.  The ekg ordered today demonstrates ***  Recent Labs: No results found for requested labs within last 8760 hours.  Recent Lipid Panel    Component Value Date/Time   CHOL 224 (H) 07/25/2017 1539   TRIG 101 07/25/2017 1539   HDL 65 07/25/2017 1539   CHOLHDL 3.4 07/25/2017 1539   LDLCALC 139 (H) 07/25/2017 1539     Risk Assessment/Calculations:   {Does this patient have ATRIAL FIBRILLATION?:[phone]}   Physical Exam:    VS:  There were no  vitals taken for this visit.    Wt Readings from Last 3 Encounters:  10/18/17 269 lb (122 kg)  10/12/17 269 lb (122 kg)  07/25/17 282 lb (127.9 kg)     GEN: *** Well nourished, well developed in no acute distress HEENT: Normal NECK: No JVD; No carotid bruits LYMPHATICS: No lymphadenopathy CARDIAC: ***RRR, no murmurs, rubs, gallops RESPIRATORY:  Clear to auscultation without rales, wheezing or rhonchi  ABDOMEN: Soft, non-tender, non-distended MUSCULOSKELETAL:  No edema; No deformity  SKIN: Warm and dry NEUROLOGIC:  Alert and oriented x 3 PSYCHIATRIC:  Normal affect   ASSESSMENT:    No diagnosis found. PLAN:    In order of problems listed above:     {Are you ordering a CV Procedure (e.g. stress  test, cath, DCCV, TEE, etc)?   Press F2        :[id number]}    Medication Adjustments/Labs and Tests Ordered: Current medicines are reviewed at length with the patient today.  Concerns regarding medicines are outlined above.  No orders of the defined types were placed in this encounter.  No orders of the defined types were placed in this encounter.   There are no Patient Instructions on file for this visit.   Signed, Freada Bergeron, MD  12/30/2020 9:03 PM    [employer]

## 2021-01-01 ENCOUNTER — Other Ambulatory Visit: Payer: Self-pay

## 2021-01-01 ENCOUNTER — Encounter: Payer: Self-pay | Admitting: Cardiology

## 2021-01-01 ENCOUNTER — Ambulatory Visit (INDEPENDENT_AMBULATORY_CARE_PROVIDER_SITE_OTHER): Payer: BC Managed Care – PPO | Admitting: Cardiology

## 2021-01-01 VITALS — BP 148/80 | HR 105 | Ht 69.0 in | Wt 270.0 lb

## 2021-01-01 DIAGNOSIS — R011 Cardiac murmur, unspecified: Secondary | ICD-10-CM | POA: Diagnosis not present

## 2021-01-01 DIAGNOSIS — E785 Hyperlipidemia, unspecified: Secondary | ICD-10-CM

## 2021-01-01 DIAGNOSIS — I77810 Thoracic aortic ectasia: Secondary | ICD-10-CM

## 2021-01-01 DIAGNOSIS — R7309 Other abnormal glucose: Secondary | ICD-10-CM

## 2021-01-01 DIAGNOSIS — I10 Essential (primary) hypertension: Secondary | ICD-10-CM | POA: Diagnosis not present

## 2021-01-01 DIAGNOSIS — I719 Aortic aneurysm of unspecified site, without rupture: Secondary | ICD-10-CM

## 2021-01-01 DIAGNOSIS — Z6841 Body Mass Index (BMI) 40.0 and over, adult: Secondary | ICD-10-CM

## 2021-01-01 MED ORDER — ROSUVASTATIN CALCIUM 20 MG PO TABS: 20.0000 mg | ORAL_TABLET | Freq: Every day | ORAL | 1 refills | Status: DC

## 2021-01-01 NOTE — Patient Instructions (Signed)
Medication Instructions:   START TAKING ROSUVASTATIN (CRESTOR) 20 MG BY MOUTH DAILY  *If you need a refill on your cardiac medications before your next appointment, please call your pharmacy*   Lab Work:  IN 6 WEEKS HERE IN THE OFFICE--WE WILL CHECK LIPIDS--PLEASE COME FASTING TO THIS LAB APPOINTMENT  If you have labs (blood work) drawn today and your tests are completely normal, you will receive your results only by: Marland Kitchen MyChart Message (if you have MyChart) OR . A paper copy in the mail If you have any lab test that is abnormal or we need to change your treatment, we will call you to review the results.   You have been referred to ENDOCRINOLOGY FOR MANAGEMENT OF SEVERELY ELEVATED HEMOGLOBIN A1C 12.4   Testing/Procedures:  Your physician has requested that you have an echocardiogram. Echocardiography is a painless test that uses sound waves to create images of your heart. It provides your doctor with information about the size and shape of your heart and how well your heart's chambers and valves are working. This procedure takes approximately one hour. There are no restrictions for this procedure.  CARDIAC CALCIUM SCORE DONE HERE IN THE OFFICE--SELF PAY   Follow-Up: At [facility], you and your health needs are our priority.  As part of our continuing mission to provide you with exceptional heart care, we have created designated Provider Care Teams.  These Care Teams include your primary Cardiologist (physician) and Advanced Practice Providers (APPs -  Physician Assistants and Nurse Practitioners) who all work together to provide you with the care you need, when you need it.  We recommend signing up for the patient portal called "MyChart".  Sign up information is provided on this After Visit Summary.  MyChart is used to connect with patients for Virtual Visits (Telemedicine).  Patients are able to view lab/test results, encounter notes, upcoming appointments, etc.  Non-urgent messages  can be sent to your provider as well.   To learn more about what you can do with MyChart, go to [email or web address].    Your next appointment:   6 month(s)  The format for your next appointment:   In Person  Provider:   You will see one of the following Advanced Practice Providers on your designated Care Team:    Richardson Dopp, PA-C  Vin Hewlett, [place]

## 2021-01-01 NOTE — Progress Notes (Signed)
Cardiology Office Note:    Date:  01/01/2021   ID:  Miranda Marquez, DOB 1961/05/30, MRN [id number]  PCP:  Vania Rea, MD   [facility] Prov[facility] Cardiologist:  Ena Dawley, MD (Inactive) {  Referring MD: Vania Rea, MD    History of Present Illness:    Miranda Marquez is a 60 y.o. 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 Meda Coffee in 2017 who now re-presents to clinic for a yearly follow-up and to address HLD and newly diagnosed DMII.  Last saw Dr. Meda Coffee in 2017 where she was experiencing dyspnea on exertion. TTE with LVEF 55-60%, G1DD, mild ascending aortic aneurysm 59mm. Myoview negative for ischemia. She was lost to follow-up.  Today, the patient states that she saw her OBGYN and was told she had high cholesterol and DMII. Specifically, her LDL cholesterol is  170, and her hemoglobin A1C is severely high at 12.4. She does not check her blood pressure at home. She denies any chest pain, shortness of breath, or palpitations. No pre-syncope, syncope, or lightheadedness/dizziness to note. Also has no lower extremity edema, orthopnea or PND.  She admits that she needs to work on changing her diet. Occasionally she will drink alcohol, usually more on the weekends. 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 Notably   Her grandmother and mother have heart disease. Her paternal grandmother died with cardiomegaly.  Past Medical History:  Diagnosis Date  . Arthritis   . Cervical herniated disc   . Complication of anesthesia    hypotension  in april after hysterectomy  . Gestational diabetes    history..back 21 plus yrs ago.  Now she is ok  . Hypertension    not taking meds   . Pituitary microadenoma with hyperprolactinemia ([place])   . Psoriasis both elbows, scalp, left thigh, saw dr Allyson Sabal, last week given cream for areas healing  . Sleep apnea    never had  study done, but things she does have it    Past Surgical History:  Procedure Laterality Date  . ABDOMINAL HYSTERECTOMY    . APPENDECTOMY    . BREAST BIOPSY Right 10/11/2014  . BREAST EXCISIONAL BIOPSY Right 07/2015  . BREAST EXCISIONAL BIOPSY Right   . BREAST LUMPECTOMY WITH RADIOACTIVE SEED LOCALIZATION Right 08/07/2015   Procedure: RIGHT BREAST LUMPECTOMY WITH RADIOACTIVE SEED LOCALIZATION AND EXCISION OF BREAST MASS X2;  Surgeon: Coralie Keens, MD;  Location: [facility];  Service: General;  Laterality: Right;  . BREAST SURGERY    . DILATION AND CURETTAGE OF UTERUS    . FOOT SURGERY     left heel    . HERNIA REPAIR     umbilical  . MAXIMUM ACCESS (MAS)POSTERIOR LUMBAR INTERBODY FUSION (PLIF) 1 LEVEL N/A 09/09/2016   Procedure: MAXIMUM ACCESS SURGERY POSTERIOR LUMBAR INTERBODY FUSION LUMBAR THREE-FOUR;  Surgeon: Eustace Moore, MD;  Location: [facility];  Service: Neurosurgery;  Laterality: N/A;  . MINI-LAPAROTOMY W/ TUBAL LIGATION    . ROBOTIC ASSISTED TOTAL HYSTERECTOMY WITH BILATERAL SALPINGO OOPHERECTOMY Bilateral 12/03/2014   Procedure: ROBOTIC ASSISTED TOTAL HYSTERECTOMY WITH BILATERAL SALPINGO OOPHORECTOMY;  Surgeon: Everitt Amber, MD;  Location: [facility];  Service: Gynecology;  Laterality: Bilateral;  . TOTAL HIP ARTHROPLASTY Right 10/18/2017   Procedure: RIGHT TOTAL HIP ARTHROPLASTY ANTERIOR APPROACH;  Surgeon: Paralee Cancel, MD;  Location: [facility];  Service: Orthopedics;  Laterality: Right;  70 mins  .  TUBAL LIGATION      Current Medications: Current Meds  Medication Sig  . rosuvastatin (CRESTOR) 20 MG tablet Take 1 tablet (20 mg total) by mouth daily.     Allergies:   Patient has no known allergies.   Social History   Socioeconomic History  . Marital status: Married    Spouse name: Not on file  . Number of children: Not on file  . Years of education: Not on file  . Highest education level: Not on file  Occupational History  . Not on file  Tobacco Use  . Smoking status:  Former Smoker    Quit date: 08/23/1978    Years since quitting: 42.3  . Smokeless tobacco: Never Used  Vaping Use  . Vaping Use: Never used  Substance and Sexual Activity  . Alcohol use: Yes    Alcohol/week: 2.0 standard drinks    Types: 2 Glasses of wine per week    Comment: occasional, weekends  . Drug use: No  . Sexual activity: Yes  Other Topics Concern  . Not on file  Social History Narrative  . Not on file   Social Determinants of Health   Financial Resource Strain: Not on file  Food Insecurity: Not on file  Transportation Needs: Not on file  Physical Activity: Not on file  Stress: Not on file  Social Connections: Not on file     Family History: The patient's family history includes Hypertension in her father.  ROS:   Please see the history of present illness.    Review of Systems  Constitutional: Negative for chills, diaphoresis, fever and malaise/fatigue.  HENT: Negative for congestion, nosebleeds and sore throat.   Eyes: Negative for blurred vision and pain.  Respiratory: Negative for cough, shortness of breath and wheezing.   Cardiovascular: Negative for chest pain, palpitations, orthopnea, claudication, leg swelling and PND.  Gastrointestinal: Negative for blood in stool, constipation, diarrhea, nausea and vomiting.  Genitourinary: Negative for dysuria, hematuria and urgency.  Musculoskeletal: Negative for falls and myalgias.  Neurological: Negative for dizziness, seizures, loss of consciousness and weakness.  Endo/Heme/Allergies: Does not bruise/bleed easily.  Psychiatric/Behavioral: Negative for depression. The patient is not nervous/anxious and does not have insomnia.      EKGs/Labs/Other Studies Reviewed:    The following studies were reviewed today:  TTE 2017: Study Conclusions   - Left ventricle: The cavity size was normal. Wall thickness was  increased in a pattern of mild LVH. Systolic function was normal.  The estimated ejection fraction  was in the range of 55% to 60%.  Wall motion was normal; there were no regional wall motion  abnormalities. Doppler parameters are consistent with abnormal  left ventricular relaxation (grade 1 diastolic dysfunction).  - Aortic valve: There was no stenosis.  - Aorta: Ascending aortic diameter: 38 mm (S).  - Ascending aorta: The ascending aorta was mildly dilated.  - Mitral valve: There was no significant regurgitation.  - Right ventricle: The cavity size was normal. Systolic function  was normal.  - Pulmonary arteries: No complete TR doppler jet so unable to  estimate PA systolic pressure.   Impressions:   - Normal LV size with mild LV hypertrophy. EF 55-60%. Normal RV  size and systolic function. No significant valvular  abnormalities.   Myoview 2017:  Nuclear stress EF: 53%.  There was no ST segment deviation noted during stress.  The study is normal.  This is a low risk study.  The left ventricular ejection fraction is normal (  55-65%).   Normal pharmacologic nuclear study with no evidence of prior infarct or ischemia.    EKG:   01/01/2021: Sinus tachycardia, rate 105 bpm, RBBB  Recent Labs: No results found for requested labs within last 8760 hours.  Recent Lipid Panel    Component Value Date/Time   CHOL 224 (H) 07/25/2017 1539   TRIG 101 07/25/2017 1539   HDL 65 07/25/2017 1539   CHOLHDL 3.4 07/25/2017 1539   LDLCALC 139 (H) 07/25/2017 1539     Risk Assessment/Calculations:       Physical Exam:    VS:  BP (!) 148/80   Pulse (!) 105   Ht 5\' 9"  (1.753 m)   Wt 270 lb (122.5 kg)   SpO2 95%   BMI 39.87 kg/m     Wt Readings from Last 3 Encounters:  01/01/21 270 lb (122.5 kg)  10/18/17 269 lb (122 kg)  10/12/17 269 lb (122 kg)     GEN: Well nourished, well developed in no acute distress HEENT: Normal NECK: No JVD; No carotid bruits LYMPHATICS: No lymphadenopathy CARDIAC: RRR, 2/6 systolic murmur, no rubs, no gallops RESPIRATORY:   Clear to auscultation without rales, wheezing or rhonchi  ABDOMEN: Soft, non-tender, non-distended MUSCULOSKELETAL:  No edema; No deformity  SKIN: Warm and dry NEUROLOGIC:  Alert and oriented x 3 PSYCHIATRIC:  Normal affect   ASSESSMENT:    1. Benign essential hypertension   2. Murmur   3. Elevated hemoglobin A1c   4. Morbid obesity with BMI of 40.0-44.9, adult ([place])   5. Hyperlipidemia, unspecified hyperlipidemia type   6. Aortic aneurysm without rupture, unspecified portion of aorta ([place])   7. Ascending aorta dilation ([facility])    PLAN:    In order of problems listed above:  #Hyperlipidemia: LDL 170, HDL 67, TC 254, TG 98. Not currently on any medications -Start crestor 20mg  daily -Check coronary calcium score -Repeat lipids in 6 weeks  #Newly Diagnosed DMII; Poorly Controlled: A1C 12.4. Not on any medications currently. Has been prescribed ?metformin by OBGYN. Has history of gestational diabetes as well. Needs Endocrinology referral. -Refer to Endo as patient likely needs insulin -Start metformin -Needs ophtho and foot exam -Discussed the importance of getting her diabetes treated at length today  #Elevated Blood Pressure: Suspect she has underlying HTN. States her blood pressure is always high in the physician's office. Will need close monitoring at hoeme. -Check blood pressures BID and keep log with goal BP <120s/80s -If elevated at home, will start valsartan at that time  #Ascending Aortic Dilation: 54mm on TTE in 2017. -Repeat TTE for monitoring  #Systolic Murmur: -TTE as above  #Morbid Obesity: BMI 40.  -Discussed healthy diet and lifestyle at length today  Exercise recommendations: Goal of exercising for at least 30 minutes a day, at least 5 times per week.  Please exercise to a moderate exertion.  This means that while exercising it is difficult to speak in full sentences, however you are not so short of breath that you feel you must stop, and not so  comfortable that you can carry on a full conversation.  Exertion level should be approximately a 5/10, if 10 is the most exertion you can perform.  Diet recommendations: Recommend a heart healthy diet such as the Mediterranean diet.  This diet consists of plant based foods, healthy fats, lean meats, olive oil.  It suggests limiting the intake of simple carbohydrates such as white breads, pastries, and pastas.  It also limits the amount of red  meat, wine, and dairy products such as cheese that one should consume on a daily basis.  Follow-up with endocrine in 1 month. Follow-up with me in 6 months.  Medication Adjustments/Labs and Tests Ordered: Current medicines are reviewed at length with the patient today.  Concerns regarding medicines are outlined above.  Orders Placed This Encounter  Procedures  . CT CARDIAC SCORING (SELF PAY ONLY)  . Lipid Profile  . Ambulatory referral to Endocrinology  . EKG 12-Lead  . ECHOCARDIOGRAM COMPLETE   Meds ordered this encounter  Medications  . rosuvastatin (CRESTOR) 20 MG tablet    Sig: Take 1 tablet (20 mg total) by mouth daily.    Dispense:  90 tablet    Refill:  1    Patient Instructions  Medication Instructions:   START TAKING ROSUVASTATIN (CRESTOR) 20 MG BY MOUTH DAILY  *If you need a refill on your cardiac medications before your next appointment, please call your pharmacy*   Lab Work:  IN 6 WEEKS HERE IN THE OFFICE--WE WILL CHECK LIPIDS--PLEASE COME FASTING TO THIS LAB APPOINTMENT  If you have labs (blood work) drawn today and your tests are completely normal, you will receive your results only by: Marland Kitchen MyChart Message (if you have MyChart) OR . A paper copy in the mail If you have any lab test that is abnormal or we need to change your treatment, we will call you to review the results.   You have been referred to ENDOCRINOLOGY FOR MANAGEMENT OF SEVERELY ELEVATED HEMOGLOBIN A1C 12.4   Testing/Procedures:  Your physician has  requested that you have an echocardiogram. Echocardiography is a painless test that uses sound waves to create images of your heart. It provides your doctor with information about the size and shape of your heart and how well your heart's chambers and valves are working. This procedure takes approximately one hour. There are no restrictions for this procedure.  CARDIAC CALCIUM SCORE DONE HERE IN THE OFFICE--SELF PAY   Follow-Up: At [facility], you and your health needs are our priority.  As part of our continuing mission to provide you with exceptional heart care, we have created designated Provider Care Teams.  These Care Teams include your primary Cardiologist (physician) and Advanced Practice Providers (APPs -  Physician Assistants and Nurse Practitioners) who all work together to provide you with the care you need, when you need it.  We recommend signing up for the patient portal called "MyChart".  Sign up information is provided on this After Visit Summary.  MyChart is used to connect with patients for Virtual Visits (Telemedicine).  Patients are able to view lab/test results, encounter notes, upcoming appointments, etc.  Non-urgent messages can be sent to your provider as well.   To learn more about what you can do with MyChart, go to [email or web address].    Your next appointment:   6 month(s)  The format for your next appointment:   In Person  Provider:   You will see one of the following Advanced Practice Providers on your designated Care Team:    Richardson Dopp, PA-C  Vin Bhagat, PA-C       I,Mathew Stumpf,acting as a scribe for Freada Bergeron, MD.,have documented all relevant documentation on the behalf of Freada Bergeron, MD,as directed by  Freada Bergeron, MD while in the presence of Freada Bergeron, MD.  I, Freada Bergeron, MD, have reviewed all documentation for this visit. The documentation on 01/01/21 for the exam, diagnosis, procedures, and  orders are all accurate and complete.  Signed, Freada Bergeron, MD  01/01/2021 3:24 PM    [facility]

## 2021-01-15 NOTE — Progress Notes (Signed)
Name: Miranda Marquez  MRN/ DOB: [id number], June 05, 1961   Age/ Sex: 60 y.o., female    PCP: Vania Rea, MD   Reason for Endocrinology Evaluation: Type 2 Diabetes Mellitus     Date of Initial Endocrinology Visit: 01/16/2021     PATIENT IDENTIFIER: Miranda Marquez is a 60 y.o. female with a past medical history of T2DM,, HTN and Dyslipidemia, Pituitary microadenoma . The patient presented for initial endocrinology clinic visit on 01/16/2021 for consultative assistance with her diabetes management.    HPI: Miranda Marquez was    Diagnosed with DM 07/2020. She had gestations diabetes ~25 yrs ago  Prior Medications tried/Intolerance: N/A Currently checking blood sugars 0 x / day Hypoglycemia episodes : no          10.2% in 2022, peaking at 12.4% in 2021 Patient required assistance for hypoglycemia: no  Patient has required hospitalization within the last 1 year from hyper or hypoglycemia: no  In terms of diet, the patient eats 2-3  meals a day. Avoids sugar- sweetened beverages   Loves steak and greasy food.    PITUITARY HISTORY: She has a diagnosis of pituitary microadenoma in 2006. With an MRI showing a 5 mm adenoma . She has a Hx of hyperprolactinemia . She was on Bromocriptine until 2021    HOME DIABETES REGIMEN: N/A  Statin: yes ACE-I/ARB: no Prior Diabetic Education: no    METER DOWNLOAD SUMMARY: Does not check   DIABETIC COMPLICATIONS: Microvascular complications:    Denies: CKD, neuropathy, retinopathy   Last eye exam: Completed yrs ago   Macrovascular complications:    Denies: CAD, PVD, CVA   PAST HISTORY: Past Medical History:  Past Medical History:  Diagnosis Date  . Arthritis   . Cervical herniated disc   . Complication of anesthesia    hypotension  in april after hysterectomy  . Gestational diabetes    history..back 21 plus yrs ago.  Now she is ok  . Hypertension    not taking meds   . Pituitary microadenoma with hyperprolactinemia ([place])   .  Psoriasis both elbows, scalp, left thigh, saw dr Allyson Sabal, last week given cream for areas healing  . Sleep apnea    never had study done, but things she does have it   Past Surgical History:  Past Surgical History:  Procedure Laterality Date  . ABDOMINAL HYSTERECTOMY    . APPENDECTOMY    . BREAST BIOPSY Right 10/11/2014  . BREAST EXCISIONAL BIOPSY Right 07/2015  . BREAST EXCISIONAL BIOPSY Right   . BREAST LUMPECTOMY WITH RADIOACTIVE SEED LOCALIZATION Right 08/07/2015   Procedure: RIGHT BREAST LUMPECTOMY WITH RADIOACTIVE SEED LOCALIZATION AND EXCISION OF BREAST MASS X2;  Surgeon: Coralie Keens, MD;  Location: [facility];  Service: General;  Laterality: Right;  . BREAST SURGERY    . DILATION AND CURETTAGE OF UTERUS    . FOOT SURGERY     left heel    . HERNIA REPAIR     umbilical  . MAXIMUM ACCESS (MAS)POSTERIOR LUMBAR INTERBODY FUSION (PLIF) 1 LEVEL N/A 09/09/2016   Procedure: MAXIMUM ACCESS SURGERY POSTERIOR LUMBAR INTERBODY FUSION LUMBAR THREE-FOUR;  Surgeon: Eustace Moore, MD;  Location: [facility];  Service: Neurosurgery;  Laterality: N/A;  . MINI-LAPAROTOMY W/ TUBAL LIGATION    . ROBOTIC ASSISTED TOTAL HYSTERECTOMY WITH BILATERAL SALPINGO OOPHERECTOMY Bilateral 12/03/2014   Procedure: ROBOTIC ASSISTED TOTAL HYSTERECTOMY WITH BILATERAL SALPINGO OOPHORECTOMY;  Surgeon: Everitt Amber, MD;  Location: [facility];  Service: Gynecology;  Laterality: Bilateral;  .  TOTAL HIP ARTHROPLASTY Right 10/18/2017   Procedure: RIGHT TOTAL HIP ARTHROPLASTY ANTERIOR APPROACH;  Surgeon: Paralee Cancel, MD;  Location: [facility];  Service: Orthopedics;  Laterality: Right;  70 mins  . TUBAL LIGATION        Social History:  reports that she quit smoking about 42 years ago. She has never used smokeless tobacco. She reports current alcohol use of about 2.0 standard drinks of alcohol per week. She reports that she does not use drugs. Family History:  Family History  Problem Relation Age of Onset  . Hypertension Father       HOME MEDICATIONS: Allergies as of 01/16/2021   No Known Allergies     Medication List       Accurate as of Jan 16, 2021 11:44 AM. If you have any questions, ask your nurse or doctor.        bromocriptine 2.5 MG tablet Commonly known as: PARLODEL Take 5 mg by mouth daily.   Duexis 800-26.6 MG Tabs Generic drug: Ibuprofen-Famotidine Take by mouth.   rosuvastatin 20 MG tablet Commonly known as: CRESTOR Take 1 tablet (20 mg total) by mouth daily.        ALLERGIES: No Known Allergies   REVIEW OF SYSTEMS: A comprehensive ROS was conducted with the patient and is negative except as per HPI and below:  Review of Systems  Gastrointestinal: Negative for diarrhea and nausea.  Neurological: Negative for tingling.      OBJECTIVE:   VITAL SIGNS: BP 140/82   Pulse 78   Ht 5\' 9"  (1.753 m)   Wt 261 lb 4 oz (118.5 kg)   SpO2 98%   BMI 38.58 kg/m    PHYSICAL EXAM:  General: Pt appears well and is in NAD  Neck: General: Supple without adenopathy or carotid bruits. Thyroid: Thyroid size normal.  No goiter or nodules appreciated.  Lungs: Clear with good BS bilat with no rales, rhonchi, or wheezes  Heart: RRR with normal S1 and S2 and no gallops; no murmurs; no rub  Abdomen: Normoactive bowel sounds, soft, nontender, without masses or organomegaly palpable  Extremities:  Lower extremities - No pretibial edema. No lesions.  Skin: Normal texture and temperature to palpation. No rash noted.  Neuro: MS is good with appropriate affect, pt is alert and Ox3    DM foot exam: 01/16/2021   The skin of the feet is intact without sores or ulcerations. The pedal pulses are 2+ on right and 2+ on left. The sensation is intact to a screening 5.07, 10 gram monofilament bilaterally   DATA REVIEWED:  Lab Results  Component Value Date   HGBA1C 10.7 (A) 01/16/2021   Results for Miranda Marquez, Miranda Marquez (MRN [id number]) as of 01/20/2021 14:08  Ref. Range 01/16/2021 12:24  Cortisol, Plasma  Latest Units: ug/dL 8.8  FSH Latest Units: mIU/ML 41.6  Prolactin Latest Units: ng/mL 23.7  TSH Latest Ref Range: 0.35 - 4.50 uIU/mL 0.70  T4,Free(Direct) Latest Ref Range: 0.60 - 1.60 ng/dL 0.76   BRAIN MRI 03/23/2005      IMPRESSION:   1. Suspect 5 mm prolactinoma in the right inferior aspect of the gland with mild remodeling of the sellar floor as well as mild deviation of the pituitary stalk from right to left.  2. Otherwise negative cranial MRI.  ASSESSMENT / PLAN / RECOMMENDATIONS:   1) Type 2 Diabetes Mellitus, Poorly controlled, Without complications - Most recent A1c of 10.7 %. Goal A1c < 7.0%.    Plan: GENERAL: I have  discussed with the patient the pathophysiology of diabetes. We went over the natural progression of the disease. We talked about both insulin resistance and insulin deficiency. We stressed the importance of lifestyle changes including diet and exercise. I explained the complications associated with diabetes including retinopathy, nephropathy, neuropathy as well as increased risk of cardiovascular disease. We went over the benefit seen with glycemic control.    I explained to the patient that diabetic patients are at higher than normal risk for amputations.   We discussed insulin which she is agreed to , she was trained in insulin pen use.   We also discussed starting Metformin with gradual titration, cautioned against GI side effects   MEDICATIONS:  Metformin 500 mg XR 2 tabs BID   Start Lantus 20 units daily   EDUCATION / INSTRUCTIONS:  BG monitoring instructions: Patient is instructed to check her blood sugars 1 times a day, Fasting .  Call [name] if: BG persistently < 70  . I reviewed the Rule of 15 for the treatment of hypoglycemia in detail with the patient. Literature supplied.   2) Diabetic complications:   Eye: Does not have known diabetic retinopathy.   Neuro/ Feet: Does not have known diabetic peripheral  neuropathy.  Renal: Patient does not have known baseline CKD. She is not on an ACEI/ARB at present.   3) Dyslipidemia:   - Started rosuvastatin a couple of weeks ago  - Discussed cardiovascular benefits of statins   Continue Rosuvastatin 20 mg daily     4) Pituitary Microadenoma:   - She was on Bromocriptine for 3 decades, Has been off since 2021 - MRI from 2006 showed a 6 mm adenoma  - Repeat prolactin is normal , we will not resume bromocriptine   Addendum: Discusses results with the pt on 01/20/2021 . She was at the beach, she has not been able to use test strips, she has not used the insulin pen yet, somehow she is concerned about double dosing ? She was educated on the use of glucose meter and insulin pen during her visit but is having issues. She was directed to the nearest [place] to obtain a new ReliOn meter and to be shown on insulin pen use by pharmacy.    Signed electronically by: Mack Guise, MD  [facility]  [facility] [address] [place], [place] [address] Phone: [phone] FAX: [phone]   CC: Vania Rea, [address] [place] [address] Phone: [phone]  Fax: [phone]    Return to Endocrinology clinic as below: Future Appointments  Date Time Provider [place]  02/12/2021  9:00 AM [facility]  02/12/2021 10:00 AM [facility]-CT 1 [facility]-CT [facility]  02/12/2021 10:35 AM [facility] ECHO 1 [id number] [facility]

## 2021-01-16 ENCOUNTER — Encounter: Payer: Self-pay | Admitting: Internal Medicine

## 2021-01-16 ENCOUNTER — Other Ambulatory Visit: Payer: Self-pay

## 2021-01-16 ENCOUNTER — Ambulatory Visit (INDEPENDENT_AMBULATORY_CARE_PROVIDER_SITE_OTHER): Payer: BC Managed Care – PPO | Admitting: Internal Medicine

## 2021-01-16 VITALS — BP 140/82 | HR 78 | Ht 69.0 in | Wt 261.2 lb

## 2021-01-16 DIAGNOSIS — E1165 Type 2 diabetes mellitus with hyperglycemia: Secondary | ICD-10-CM | POA: Diagnosis not present

## 2021-01-16 DIAGNOSIS — D352 Benign neoplasm of pituitary gland: Secondary | ICD-10-CM | POA: Diagnosis not present

## 2021-01-16 DIAGNOSIS — R7309 Other abnormal glucose: Secondary | ICD-10-CM | POA: Diagnosis not present

## 2021-01-16 LAB — POCT GLUCOSE (DEVICE FOR HOME USE): POC Glucose: 241 mg/dl — AB (ref 70–99)

## 2021-01-16 LAB — T4, FREE: Free T4: 0.76 ng/dL (ref 0.60–1.60)

## 2021-01-16 LAB — POCT GLYCOSYLATED HEMOGLOBIN (HGB A1C): Hemoglobin A1C: 10.7 % — AB (ref 4.0–5.6)

## 2021-01-16 LAB — CORTISOL: Cortisol, Plasma: 8.8 ug/dL

## 2021-01-16 LAB — TSH: TSH: 0.7 u[IU]/mL (ref 0.35–4.50)

## 2021-01-16 MED ORDER — ONETOUCH VERIO VI STRP: ORAL_STRIP | 12 refills | Status: AC

## 2021-01-16 MED ORDER — LANTUS SOLOSTAR 100 UNIT/ML ~~LOC~~ SOPN: 20.0000 [IU] | PEN_INJECTOR | Freq: Every day | SUBCUTANEOUS | 11 refills | Status: DC

## 2021-01-16 MED ORDER — METFORMIN HCL ER 500 MG PO TB24: 1000.0000 mg | ORAL_TABLET | Freq: Two times a day (BID) | ORAL | 1 refills | Status: DC

## 2021-01-16 MED ORDER — INSULIN PEN NEEDLE 32G X 4 MM MISC: 1.0000 | Freq: Every day | 6 refills | Status: DC

## 2021-01-16 NOTE — Patient Instructions (Addendum)
-   Start Metformin 1 tablet daily with Breakfast for 1 week, then increase to 1 tablet with Breakfast and 1 tablet with Supper for  1 week, then increase to 2 tablets with Breakfast  and 1 tablet with Supper for another 1 week , then finally 2 Tablets with Breakfast and 2 tablets with Supper.    - Start Lantus 20 units daily       HOW TO TREAT LOW BLOOD SUGARS (Blood sugar LESS THAN 70 MG/DL)  Please follow the RULE OF 15 for the treatment of hypoglycemia treatment (when your (blood sugars are less than 70 mg/dL)    STEP 1: Take 15 grams of carbohydrates when your blood sugar is low, which includes:   3-4 GLUCOSE TABS  OR  3-4 OZ OF JUICE OR REGULAR SODA OR  ONE TUBE OF GLUCOSE GEL     STEP 2: RECHECK blood sugar in 15 MINUTES STEP 3: If your blood sugar is still low at the 15 minute recheck --> then, go back to STEP 1 and treat AGAIN with another 15 grams of carbohydrates.

## 2021-01-20 LAB — FOLLICLE STIMULATING HORMONE: FSH: 41.6 m[IU]/mL

## 2021-01-21 LAB — EXTRA SPECIMEN

## 2021-01-21 LAB — INSULIN-LIKE GROWTH FACTOR
IGF-I, LC/MS: 106 ng/mL (ref 41–279)
Z-Score (Female): -0.4 SD (ref ?–2.0)

## 2021-01-21 LAB — PROLACTIN: Prolactin: 23.7 ng/mL

## 2021-01-21 LAB — ACTH: C206 ACTH: 27 pg/mL (ref 6–50)

## 2021-02-12 ENCOUNTER — Other Ambulatory Visit: Payer: Self-pay

## 2021-02-12 ENCOUNTER — Other Ambulatory Visit: Payer: BC Managed Care – PPO

## 2021-02-12 ENCOUNTER — Other Ambulatory Visit (HOSPITAL_COMMUNITY): Payer: BC Managed Care – PPO

## 2021-02-26 ENCOUNTER — Telehealth (HOSPITAL_COMMUNITY): Payer: Self-pay | Admitting: Cardiology

## 2021-02-26 NOTE — Telephone Encounter (Signed)
I called patient on 02/19/21 to reschedule echocardiogram. Patient states she will call me back when she is ready to reschedule. Order will be removed from the ECHO WQ and if patient calls back to reschedule we will reinstate the order. Thank you.

## 2021-03-26 ENCOUNTER — Ambulatory Visit (HOSPITAL_COMMUNITY): Payer: BC Managed Care – PPO | Attending: Cardiovascular Disease

## 2021-03-26 ENCOUNTER — Ambulatory Visit (INDEPENDENT_AMBULATORY_CARE_PROVIDER_SITE_OTHER)
Admission: RE | Admit: 2021-03-26 | Discharge: 2021-03-26 | Disposition: A | Discharge status: Home / Self Care | Payer: Self-pay | Source: Ambulatory Visit | Attending: Cardiology | Admitting: Cardiology

## 2021-03-26 ENCOUNTER — Other Ambulatory Visit: Payer: Self-pay

## 2021-03-26 DIAGNOSIS — E785 Hyperlipidemia, unspecified: Secondary | ICD-10-CM

## 2021-03-26 DIAGNOSIS — I77810 Thoracic aortic ectasia: Secondary | ICD-10-CM | POA: Diagnosis not present

## 2021-03-26 DIAGNOSIS — Z6841 Body Mass Index (BMI) 40.0 and over, adult: Secondary | ICD-10-CM

## 2021-03-26 DIAGNOSIS — R011 Cardiac murmur, unspecified: Secondary | ICD-10-CM | POA: Insufficient documentation

## 2021-03-26 DIAGNOSIS — R7309 Other abnormal glucose: Secondary | ICD-10-CM

## 2021-03-26 DIAGNOSIS — I10 Essential (primary) hypertension: Secondary | ICD-10-CM

## 2021-03-26 LAB — ECHOCARDIOGRAM COMPLETE
Area-P 1/2: 2.5 cm2
S' Lateral: 3.5 cm

## 2021-03-27 ENCOUNTER — Other Ambulatory Visit: Payer: Self-pay | Admitting: *Deleted

## 2021-03-27 DIAGNOSIS — E785 Hyperlipidemia, unspecified: Secondary | ICD-10-CM

## 2021-04-02 ENCOUNTER — Other Ambulatory Visit: Payer: Self-pay

## 2021-04-02 ENCOUNTER — Encounter: Payer: BC Managed Care – PPO | Attending: Internal Medicine | Admitting: Dietician

## 2021-04-02 ENCOUNTER — Encounter: Payer: Self-pay | Admitting: Dietician

## 2021-04-02 DIAGNOSIS — E119 Type 2 diabetes mellitus without complications: Secondary | ICD-10-CM | POA: Insufficient documentation

## 2021-04-02 NOTE — Progress Notes (Signed)
Diabetes Self-Management Education  Visit Type: First/Initial  Appt. Start Time: 1450 Appt. End Time: [id number]  04/02/2021  [name], identified by name and date of birth, is a 60 y.o. female with a diagnosis of Diabetes: Type 2.   ASSESSMENT Patient is here today alone. She was recently diagnosed with diabetes.  She states that she enjoys high fat foods, does not crave sweets.  She states that she enjoys food and does not want her diet to be too restrictive. She states that she enjoys meat.  She states she would rather not eat than eat fruits and vegetables.  She is now limiting red meat and is making herself eat salads.  She stopped eating bread and she has started using scales. Quit drinking wine in March 2022 (was drinking 1 bottle per day). She states that her blood pressure is 150/100 consistently.  History includes:  Type 2 diabetes (10/2020), HTN A1C 10.7& 01/16/2021 Medications include Lantus 20 units q HS, metformin  Weight hx:   254 lbs 04/02/2021 249 lbs 2 weeks ago and gained with increased stress and meal choices due to father's illness and death. 299 lbs highest adult weight.  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  Patient does the shopping and cooking.  She states that she does not cook much.  She cares for her 49 yo grandson and 25 mo old granddaughter daily. She was a homemaker and volunteered frequently. She has started walking and swimming.  She has a full gym at the house as well as outdoor pool.  Weight 254 lb (115.2 kg). Body mass index is 37.51 kg/m.   Diabetes Self-Management Education - 04/02/21 1514       Visit Information   Visit Type First/Initial      Initial Visit   Diabetes Type Type 2    Are you currently following a meal plan? No    Are you taking your medications as prescribed? Yes    Date Diagnosed 12/2020      Health Coping   How would you rate your overall  health? Good      Psychosocial Assessment   Patient Belief/Attitude about Diabetes Motivated to manage diabetes    Self-care barriers None    Self-management support Doctor's office    Other persons present Patient    Patient Concerns Nutrition/Meal planning;Glycemic Control;Weight Control    Special Needs None    Learning Readiness Ready    How often do you need to have someone help you when you read instructions, pamphlets, or other written materials from your doctor or pharmacy? 1 - Never    What is the last grade level you completed in school? 4 years college      Pre-Education Assessment   Patient understands the diabetes disease and treatment process. Needs Instruction    Patient understands incorporating nutritional management into lifestyle. Needs Instruction    Patient undertands incorporating physical activity into lifestyle. Needs Instruction    Patient understands using medications safely. Needs Instruction    Patient understands monitoring blood glucose, interpreting and using results Needs Instruction    Patient understands prevention, detection, and treatment of acute complications. Needs Instruction    Patient understands prevention, detection, and treatment of chronic complications. Needs Instruction    Patient understands how to develop strategies to address psychosocial issues. Needs Instruction    Patient understands how to develop strategies to promote health/change behavior. Needs Instruction  Complications   Last HgB A1C per patient/outside source 10.7 %   01/16/21   How often do you check your blood sugar? 1-2 times/day    Fasting Blood glucose range (mg/dL) 70-129    Postprandial Blood glucose range (mg/dL) 130-179    Number of hypoglycemic episodes per month 0    Number of hyperglycemic episodes per week 1    Have you had a dilated eye exam in the past 12 months? No    Have you had a dental exam in the past 12 months? Yes    Are you checking your feet? No       Dietary Intake   Breakfast skiips, coffee with sugar free creamer    Snack (morning) none    Lunch chicken nuggets (grilled), fruit, salad    Snack (afternoon) none    Dinner salmon or chicken (grilled), vegetables (Often out to eat)   5-7   Snack (evening) unsalted peanuts, cheese straws, or PB crackers   eating out of boredom   Beverage(s) water, coffee with sugar free cream      Exercise   Exercise Type Light (walking / raking leaves)    How many days per week to you exercise? 2    How many minutes per day do you exercise? 30    Total minutes per week of exercise 60      Patient Education   Previous Diabetes Education Yes (please comment)   GDM   Disease state  Definition of diabetes, type 1 and 2, and the diagnosis of diabetes    Nutrition management  Role of diet in the treatment of diabetes and the relationship between the three main macronutrients and blood glucose level;Food label reading, portion sizes and measuring food.;Meal options for control of blood glucose level and chronic complications.;Information on hints to eating out and maintain blood glucose control.    Physical activity and exercise  Role of exercise on diabetes management, blood pressure control and cardiac health.;Helped patient identify appropriate exercises in relation to his/her diabetes, diabetes complications and other health issue.    Medications Reviewed patients medication for diabetes, action, purpose, timing of dose and side effects.    Monitoring Purpose and frequency of SMBG.;Identified appropriate SMBG and/or A1C goals.    Acute complications Taught treatment of hypoglycemia - the 15 rule.;Discussed and identified patients' treatment of hyperglycemia.    Chronic complications Relationship between chronic complications and blood glucose control;Retinopathy and reason for yearly dilated eye exams    Psychosocial adjustment Role of stress on diabetes;Identified and addressed patients feelings and  concerns about diabetes      Individualized Goals (developed by patient)   Nutrition General guidelines for healthy choices and portions discussed    Physical Activity Exercise 3-5 times per week;30 minutes per day    Medications take my medication as prescribed    Monitoring  test my blood glucose as discussed    Reducing Risk examine blood glucose patterns      Post-Education Assessment   Patient understands the diabetes disease and treatment process. Demonstrates understanding / competency    Patient understands incorporating nutritional management into lifestyle. Needs Review    Patient undertands incorporating physical activity into lifestyle. Needs Review    Patient understands using medications safely. Demonstrates understanding / competency    Patient understands monitoring blood glucose, interpreting and using results Demonstrates understanding / competency    Patient understands prevention, detection, and treatment of acute complications. Demonstrates understanding / competency  Patient understands prevention, detection, and treatment of chronic complications. Demonstrates understanding / competency    Patient understands how to develop strategies to address psychosocial issues. Demonstrates understanding / competency    Patient understands how to develop strategies to promote health/change behavior. Needs Review      Outcomes   Expected Outcomes Demonstrated interest in learning. Expect positive outcomes    Future DMSE 3-4 months    Program Status Not Completed             Individualized Plan for Diabetes Self-Management Training:   Learning Objective:  Patient will have a greater understanding of diabetes self-management. Patient education plan is to attend individual and/or group sessions per assessed needs and concerns.   Plan:   Patient Instructions  Mindfulness when you eat  Read food labels to make mindful decisions  Eat slowly and stop when you are  satisfied  Before snacking, Ask, "Am I really hungry or eating for another reason?"  "What can I do instead?"  Avoid eating in front of the TV or with other distraction.  Eat sitting rather than standing.  Continue to stay active.  Aim for 30 minutes most days of the week.  Walking or other exercise particularly after you eat can help reduce your blood sugar.   Aim for 2-3 Carb Choices per meal (30-45 grams) +/- 1 either way  Aim for 0-1 Carbs per snack if hungry (no snacks or low carb snacks are best) Include protein in moderation with your meals and snacks Consider reading food labels for Total Carbohydrate of foods Continue checking BG at alternate times per day  Continue taking medication as directed by MD   [employer] (personal chef) Apps:  My Fitness Pal and [employer] Add 2 T ground flax seeds daily (this may be beneficial to your blood pressure)  Expected Outcomes:  Demonstrated interest in learning. Expect positive outcomes  Education material provided: ADA - How to Thrive: A Guide for Your Journey with Diabetes, Food label handouts, Meal plan card, Snack sheet, and Diabetes Resources  If problems or questions, patient to contact team via:  Phone  Future DSME appointment: 3-4 months

## 2021-04-02 NOTE — Patient Instructions (Addendum)
Mindfulness when you eat  Read food labels to make mindful decisions  Eat slowly and stop when you are satisfied  Before snacking, Ask, "Am I really hungry or eating for another reason?"  "What can I do instead?"  Avoid eating in front of the TV or with other distraction.  Eat sitting rather than standing.  Continue to stay active.  Aim for 30 minutes most days of the week.  Walking or other exercise particularly after you eat can help reduce your blood sugar.   Aim for 2-3 Carb Choices per meal (30-45 grams) +/- 1 either way  Aim for 0-1 Carbs per snack if hungry (no snacks or low carb snacks are best) Include protein in moderation with your meals and snacks Consider reading food labels for Total Carbohydrate of foods Continue checking BG at alternate times per day  Continue taking medication as directed by MD   [employer] (personal chef) Apps:  My Fitness Pal and [employer] Add 2 T ground flax seeds daily (this may be beneficial to your blood pressure)

## 2021-04-03 ENCOUNTER — Other Ambulatory Visit: Payer: BC Managed Care – PPO | Admitting: *Deleted

## 2021-04-03 DIAGNOSIS — E785 Hyperlipidemia, unspecified: Secondary | ICD-10-CM | POA: Diagnosis not present

## 2021-04-04 LAB — HEPATIC FUNCTION PANEL
ALT: 18 IU/L (ref 0–32)
AST: 17 IU/L (ref 0–40)
Albumin: 4.7 g/dL (ref 3.8–4.8)
Alkaline Phosphatase: 82 IU/L (ref 44–121)
Bilirubin Total: 0.5 mg/dL (ref 0.0–1.2)
Bilirubin, Direct: 0.18 mg/dL (ref 0.00–0.40)
Total Protein: 6.8 g/dL (ref 6.0–8.5)

## 2021-04-04 LAB — LIPID PANEL
Chol/HDL Ratio: 2.3 ratio (ref 0.0–4.4)
Cholesterol, Total: 147 mg/dL (ref 100–199)
HDL: 64 mg/dL (ref >39)
LDL Chol Calc (NIH): 64 mg/dL (ref 0–99)
Triglycerides: 106 mg/dL (ref 0–149)
VLDL Cholesterol Cal: 19 mg/dL (ref 5–40)

## 2021-04-17 ENCOUNTER — Telehealth: Payer: Self-pay | Admitting: Cardiology

## 2021-04-17 NOTE — Telephone Encounter (Signed)
Pt c/o BP issue: STAT if pt c/o blurred vision, one-sided weakness or slurred speech  1. What are your last 5 BP readings?  160/108 140/92  2. Are you having any other symptoms (ex. Dizziness, headache, blurred vision, passed out)? Not now   3. What is your BP issue? Patient said yesterday she has had fluctuating BP for the past couple weeks. Yesterday she was dizzy and felt wired and agitated. She could not sleep very well. She was on BP medication before (lisinopril) but she is not on it anymore. She wanted to know what to do

## 2021-04-17 NOTE — Telephone Encounter (Signed)
Pt is calling into the office to make an appt with an APP, in absence of Dr. Johney Frame.  Pt states her BP's at baseline are usually in the 140s/80s.  Pt states at about 11 pm last night, she started feeling very dizzy and lightheaded, and states she felt very "agitated and wired."  Pt states she couldn't sleep well at all.  Pt states she then took her BP at 11 pm and it was 168/104 HR-80. Pt states she tried to wind down and relax and symptoms continued.  Pt states she retook her BP at 1 am and it was 168/100 HR-80, 3 am 151/92, then at around 10 am this morning it finally went down to 145/89 HR still in 80s. Pt denies any chest pain, sob, doe, headache, weakness, N/V, pre-syncopal or syncopal episodes.  Pt states she is feeling better now, but would like to be seen next week, for further evaluation and treatment of this.  Pt states she was on lisinopril in the past, but Dr. Meda Coffee took her off of this, for pt was working on healthy diet, lifestyle changes, and weight loss at that time. Pt states she is still eating healthy and is trying to lose more weight. Pt states she maintains a low sodium diet as well. Pt adds she is under a lot of stress at this time, with providing childcare to her grandchildren and other issues going on in her life.  Scheduled the pt to come in and see Richardson Dopp PA-C for next Tuesday 8/30 at 1145.  She is aware to arrive 15 mins prior to that appt. Advised the pt to continue monitoring her pressures and rates, and log this information. Advised her to bring this into the office visit with Nicki Reaper next week. ED precautions provided to the pt, if symptoms and pressures were to worsen or persist between now and her appt next week. Advised her to maintain a healthy diet and low sodium diet. Pt verbalized understanding and agrees with this plan. Will send this message to Richardson Dopp PA-C and Dr. Jacolyn Reedy covering, to make them aware of this plan.

## 2021-04-20 NOTE — Progress Notes (Signed)
Cardiology Office Note:    Date:  04/21/2021   ID:  Miranda Marquez, DOB [birth year], MRN [id number]  PCP:  Vania Rea, MD   [facility] Cardiologist:  Freada Bergeron, MD      Referring MD: Vania Rea, MD   Chief Complaint:  F/u on HTN    Patient Profile:    Miranda Marquez is a 60 y.o. female with:  Coronary calcification (CT 8/22) CAC score 242.5 (95th percentile) Pituitary microadenoma Hypertension  Hyperlipidemia  Diabetes mellitus  Hx of gestation diabetes  Dilated ascending aorta (38 mm) on echocardiogram in 2017  CT 8/22: normal aorta   Prior CV studies: CT CARDIAC SCORING 03/26/21 IMPRESSION: Coronary calcium score of 242.5. This was 95th percentile for age-, race-, and sex-matched controls. Normal Aorta  ECHOCARDIOGRAM 03/26/21 EF 62, no RWMA, GR 1 DD, GLS -21.3, normal RVSF, moderate LAE, trivial MR  GATED SPECT MYO PERF W/LEXISCAN STRESS 2D 05/14/2016 Normal pharmacologic nuclear study with no evidence of prior infarct or ischemia.  EF 53    History of Present Illness: Miranda Marquez was evaluated by Dr. Helaine Chess in 5/22.  She was started on Rosuvastatin for hyperlipidemia and Metformin for diabetes mellitus.  She was referred to endocrinology.  A f/u echocardiogram demonstrated normal EF, mild diastolic dysfunction and no valve disease.  A cardiac CT showed a CAC score of 243 and a normal aorta. She called in recently with elevated BPs. She is seen for further evaluation.  She is here alone.  She brings in a list of her BPs over the past couple of months.  All of her BPs are above target.  She has been under a lot of stress recently  Her father died.  A lot of family and friends have brought meals for her to eat which has increased her salt load.  She has had some hip and back problems and has gained weight.  She is watching her 64 yo grandson. Her daughter has pre-eclampsia and cannot care for him as well.  She had several bee stings yesterday out in her  yard.  She had an elevation in her BP several nights ago (168/104) and felt poorly with this (HA, shortness of breath).  Otherwise, she has not had chest pain, significant shortness of breath, syncope, orthopnea, leg edema.  She likes to sleep on an incline b/c of her hip.      Past Medical History:  Diagnosis Date   Arthritis    Cervical herniated disc    Complication of anesthesia    hypotension  in april after hysterectomy   Gestational diabetes    history..back 21 plus yrs ago.  Now she is ok   Hypertension    not taking meds    Pituitary microadenoma with hyperprolactinemia (HCC)    Psoriasis both elbows, scalp, left thigh, saw dr Allyson Sabal, last week given cream for areas healing   Sleep apnea    never had study done, but things she does have it    Current Medications: Current Meds  Medication Sig   glucose blood (ONETOUCH VERIO) test strip Use as instructed   Ibuprofen-Famotidine 800-26.6 MG TABS Take by mouth.   insulin glargine (LANTUS SOLOSTAR) 100 UNIT/ML Solostar Pen Inject 20 Units into the skin daily.   Insulin Pen Needle 32G X 4 MM MISC 1 Device by Does not apply route daily.   metFORMIN (GLUCOPHAGE-XR) 500 MG 24 hr tablet Take 2 tablets (1,000 mg total) by mouth in the morning  and at bedtime.   rosuvastatin (CRESTOR) 20 MG tablet Take 1 tablet (20 mg total) by mouth daily.   valsartan (DIOVAN) 80 MG tablet Take 1 tablet (80 mg total) by mouth daily.     Allergies:   Patient has no known allergies.   Social History   Tobacco Use   Smoking status: Former    Types: Cigarettes    Quit date: 08/23/1978    Years since quitting: 42.6   Smokeless tobacco: Never  Vaping Use   Vaping Use: Never used  Substance Use Topics   Alcohol use: Yes    Alcohol/week: 2.0 standard drinks    Types: 2 Glasses of wine per week    Comment: occasional, weekends   Drug use: No     Family Hx: The patient's family history includes Hypertension in her father.  Review of Systems   Gastrointestinal:  Negative for hematochezia.  Genitourinary:  Negative for hematuria.  Neurological:  Positive for headaches.    EKGs/Labs/Other Test Reviewed:    EKG:  EKG is   ordered today.  The ekg ordered today demonstrates NSR, HR 74, leftward axis, right bundle branch block, nonspecific ST-T wave changes, QTC 463, no change from prior tracings  Recent Labs: 01/16/2021: TSH 0.70 04/03/2021: ALT 18   Recent Lipid Panel Lab Results  Component Value Date/Time   CHOL 147 04/03/2021 01:30 PM   TRIG 106 04/03/2021 01:30 PM   HDL 64 04/03/2021 01:30 PM   LDLCALC 64 04/03/2021 01:30 PM      Risk Assessment/Calculations:          Physical Exam:    VS:  BP (!) 150/98 (BP Location: Left Arm, Patient Position: Sitting, Cuff Size: Normal)   Pulse 74   Ht 5' 8.5" (1.74 m)   Wt 256 lb 12.8 oz (116.5 kg)   BMI 38.48 kg/m     Wt Readings from Last 3 Encounters:  04/21/21 256 lb 12.8 oz (116.5 kg)  04/02/21 254 lb (115.2 kg)  01/16/21 261 lb 4 oz (118.5 kg)     Constitutional:      Appearance: Healthy appearance. Not in distress.  Pulmonary:     Effort: Pulmonary effort is normal.     Breath sounds: No wheezing. No rales.  Cardiovascular:     Normal rate. Regular rhythm. Normal S1. Normal S2.      Murmurs: There is no murmur.  Edema:    Peripheral edema absent.  Abdominal:     Palpations: Abdomen is soft. There is no hepatomegaly.  Musculoskeletal:     Cervical back: Neck supple. Skin:    General: Skin is warm and dry.  Neurological:     Mental Status: Alert and oriented to person, place and time.     Cranial Nerves: Cranial nerves are intact.        ASSESSMENT & PLAN:    1. Essential hypertension BP is uncontrolled.  She was previously on an ACE inhibitor.  Goal BP < 130/80.  Start Valsartan 80 mg once daily.  BMET 2 weeks.  She plans to change her diet and increase activity; lose weight.  She knows to contact me if her BP is not coming down.  F/u in 3 mos.    2. Pure hypercholesterolemia Recent LDL 64.  Continue Rosuvastatin 20 mg once daily.    3. Coronary artery calcification Recent calcium score 242 placing her in the 95th percentile.  She is not having anginal symptoms.  Continue rosuvastatin 20 mg daily.  4. Dilated aorta 38 mm on echocardiogram in 2017.  Normal size and structure on recent echocardiogram.  Normal aorta on recent CT.  5. Diastolic dysfunction Recent echocardiogram with mild diastolic dysfunction.  There was evidence of elevated LVEDP with normal mean LA pressure).  However, she does not really have significant shortness of breath with exertion.  When her blood pressure was significantly elevated, she did have some dyspnea.  But, her breathing has been stable since.  I will start valsartan as outlined above.  If her blood pressure does not reach goal with this, we could add low-dose HCTZ to help with her blood pressure.  Dispo:  Return in about 3 months (around 07/22/2021) for Routine Follow Up, w/ Dr. Johney Frame, or Richardson Dopp, PA-C.   Medication Adjustments/Labs and Tests Ordered: Current medicines are reviewed at length with the patient today.  Concerns regarding medicines are outlined above.  Tests Ordered: Orders Placed This Encounter  Procedures   Basic metabolic panel   Basic Metabolic Panel (BMET)   EKG 12-Lead   Medication Changes: Meds ordered this encounter  Medications   valsartan (DIOVAN) 80 MG tablet    Sig: Take 1 tablet (80 mg total) by mouth daily.    Dispense:  30 tablet    Refill:  11    Order Specific Question:   Supervising Provider    Answer:   Lelon Perla [[id number]]    Signed, Richardson Dopp, PA-C  04/21/2021 1:07 PM    [facility] [address], [place], [place]  [address] Phone: [phone]; Fax: [phone]

## 2021-04-20 NOTE — Telephone Encounter (Signed)
Ok.  Reviewed Dr. Jacolyn Reedy note.  Will likely start Valsartan at appt. Limit salt (< 2gm Na per day). Richardson Dopp, PA-C    04/20/2021 11:44 AM

## 2021-04-20 NOTE — Telephone Encounter (Signed)
Pt will see Nicki Reaper tomorrow, where possible plan will be endorsed.

## 2021-04-21 ENCOUNTER — Other Ambulatory Visit: Payer: Self-pay

## 2021-04-21 ENCOUNTER — Encounter: Payer: Self-pay | Admitting: Physician Assistant

## 2021-04-21 ENCOUNTER — Ambulatory Visit: Payer: BC Managed Care – PPO | Admitting: Physician Assistant

## 2021-04-21 VITALS — BP 150/98 | HR 74 | Ht 68.5 in | Wt 256.8 lb

## 2021-04-21 DIAGNOSIS — I5189 Other ill-defined heart diseases: Secondary | ICD-10-CM

## 2021-04-21 DIAGNOSIS — I10 Essential (primary) hypertension: Secondary | ICD-10-CM | POA: Diagnosis not present

## 2021-04-21 DIAGNOSIS — E119 Type 2 diabetes mellitus without complications: Secondary | ICD-10-CM

## 2021-04-21 DIAGNOSIS — I77810 Thoracic aortic ectasia: Secondary | ICD-10-CM

## 2021-04-21 DIAGNOSIS — I2510 Atherosclerotic heart disease of native coronary artery without angina pectoris: Secondary | ICD-10-CM | POA: Diagnosis not present

## 2021-04-21 DIAGNOSIS — E7800 Pure hypercholesterolemia, unspecified: Secondary | ICD-10-CM

## 2021-04-21 DIAGNOSIS — I2584 Coronary atherosclerosis due to calcified coronary lesion: Secondary | ICD-10-CM

## 2021-04-21 MED ORDER — VALSARTAN 80 MG PO TABS: 80.0000 mg | ORAL_TABLET | Freq: Every day | ORAL | 11 refills | Status: DC

## 2021-04-21 NOTE — Patient Instructions (Signed)
Medication Instructions:  Your physician has recommended you make the following change in your medication:   START Valsartan 80 mg taking 1 daily   *If you need a refill on your cardiac medications before your next appointment, please call your pharmacy*   Lab Work: TODAY:  BMET  If you have labs (blood work) drawn today and your tests are completely normal, you will receive your results only by: [facility] (if you have MyChart) OR A paper copy in the mail If you have any lab test that is abnormal or we need to change your treatment, we will call you to review the results.   Testing/Procedures: None ordered   Follow-Up: At [facility], you and your health needs are our priority.  As part of our continuing mission to provide you with exceptional heart care, we have created designated Provider Care Teams.  These Care Teams include your primary Cardiologist (physician) and Advanced Practice Providers (APPs -  Physician Assistants and Nurse Practitioners) who all work together to provide you with the care you need, when you need it.  We recommend signing up for the patient portal called "MyChart".  Sign up information is provided on this After Visit Summary.  MyChart is used to connect with patients for Virtual Visits (Telemedicine).  Patients are able to view lab/test results, encounter notes, upcoming appointments, etc.  Non-urgent messages can be sent to your provider as well.   To learn more about what you can do with MyChart, go to [email or web address].    Your next appointment:   3 month(s)  07/22/2021 11:00 for 11:15 appt  The format for your next appointment:   In Person  Provider:   You will see one of the following Advanced Practice Providers on your designated Care Team:   Richardson Dopp, PA-C Robbie Lis, [place]     Other Instructions

## 2021-04-23 ENCOUNTER — Other Ambulatory Visit: Payer: Self-pay

## 2021-04-23 ENCOUNTER — Encounter: Payer: Self-pay | Admitting: Internal Medicine

## 2021-04-23 ENCOUNTER — Ambulatory Visit: Payer: BC Managed Care – PPO | Admitting: Internal Medicine

## 2021-04-23 VITALS — BP 142/96 | HR 78 | Ht 68.5 in | Wt 258.4 lb

## 2021-04-23 DIAGNOSIS — D352 Benign neoplasm of pituitary gland: Secondary | ICD-10-CM

## 2021-04-23 DIAGNOSIS — E1165 Type 2 diabetes mellitus with hyperglycemia: Secondary | ICD-10-CM

## 2021-04-23 LAB — MICROALBUMIN / CREATININE URINE RATIO
Creatinine,U: 110.6 mg/dL
Microalb Creat Ratio: 0.8 mg/g (ref 0.0–30.0)
Microalb, Ur: 0.8 mg/dL (ref 0.0–1.9)

## 2021-04-23 LAB — POCT GLYCOSYLATED HEMOGLOBIN (HGB A1C): Hemoglobin A1C: 6.6 % — AB (ref 4.0–5.6)

## 2021-04-23 MED ORDER — DAPAGLIFLOZIN PROPANEDIOL 5 MG PO TABS: 5.0000 mg | ORAL_TABLET | Freq: Every day | ORAL | 6 refills | Status: DC

## 2021-04-23 NOTE — Progress Notes (Signed)
Name: Miranda Marquez  Age/ Sex: 60 y.o., female   MRN/ DOB: [id number], 13-Aug-1961     PCP: Vania Rea, MD   Reason for Endocrinology Evaluation: Type 2 Diabetes Mellitus  Initial Endocrine Consultative Visit: 01/16/2021    PATIENT IDENTIFIER: Miranda Marquez is a 60 y.o. female with a past medical history of HTN, T2DM, HTN and dyslipidemia. The patient has followed with Endocrinology clinic since 01/16/2021 for consultative assistance with management of her diabetes.  DIABETIC HISTORY:  Miranda Marquez was diagnosed with DM 07/2020.  She had gestations diabetes > 25 yrs ago. Her hemoglobin A1c has ranged from 10.2% in 2022, peaking at 12.4% in 2021  On her initial visit to our clinic she had an A1c of 10.7% she was not on any medications, we started metformin and insulin   PITUITARY HISTORY: She has a diagnosis of pituitary microadenoma in 2006. With an MRI showing a 5 mm adenoma . She has a Hx of hyperprolactinemia . She was on Bromocriptine until 2021      SUBJECTIVE:   During the last visit (01/16/2021): A1c 10.7% Started metformin and insulin   Today (04/23/2021): Miranda Marquez  is here for a follow up on diabetes management. She checks her blood sugars 2 times daily. The patient has not had hypoglycemic episodes since the last clinic visit.  Denies nausea or diarrhea    HOME DIABETES REGIMEN:  Metformin 500 mg XR 2 tabs BID  Lantus 20 units daily      Statin: yes ACE-I/ARB: no Prior Diabetic Education: no   GLUCOSE LOG:  114- 193 mg/dL in the past month    DIABETIC COMPLICATIONS: Microvascular complications:   Denies: CKD, neuropathy, retinopathy Last Eye Exam: Completed yrs ago   Macrovascular complications:   Denies: CAD, CVA, PVD   HISTORY:  Past Medical History:  Past Medical History:  Diagnosis Date   Arthritis    Cervical herniated disc    Complication of anesthesia    hypotension  in april after hysterectomy   Gestational diabetes    history..back 21  plus yrs ago.  Now she is ok   Hypertension    not taking meds    Pituitary microadenoma with hyperprolactinemia (HCC)    Psoriasis both elbows, scalp, left thigh, saw dr Allyson Sabal, last week given cream for areas healing   Sleep apnea    never had study done, but things she does have it   Past Surgical History:  Past Surgical History:  Procedure Laterality Date   ABDOMINAL HYSTERECTOMY     APPENDECTOMY     BREAST BIOPSY Right 10/11/2014   BREAST EXCISIONAL BIOPSY Right 07/2015   BREAST EXCISIONAL BIOPSY Right    BREAST LUMPECTOMY WITH RADIOACTIVE SEED LOCALIZATION Right 08/07/2015   Procedure: RIGHT BREAST LUMPECTOMY WITH RADIOACTIVE SEED LOCALIZATION AND EXCISION OF BREAST MASS X2;  Surgeon: Coralie Keens, MD;  Location: [facility];  Service: General;  Laterality: Right;   BREAST SURGERY     DILATION AND CURETTAGE OF UTERUS     FOOT SURGERY     left heel     HERNIA REPAIR     umbilical   MAXIMUM ACCESS (MAS)POSTERIOR LUMBAR INTERBODY FUSION (PLIF) 1 LEVEL N/A 09/09/2016   Procedure: MAXIMUM ACCESS SURGERY POSTERIOR LUMBAR INTERBODY FUSION LUMBAR THREE-FOUR;  Surgeon: Eustace Moore, MD;  Location: [facility];  Service: Neurosurgery;  Laterality: N/A;   MINI-LAPAROTOMY W/ TUBAL LIGATION     ROBOTIC ASSISTED TOTAL HYSTERECTOMY WITH BILATERAL SALPINGO OOPHERECTOMY Bilateral 12/03/2014  Procedure: ROBOTIC ASSISTED TOTAL HYSTERECTOMY WITH BILATERAL SALPINGO OOPHORECTOMY;  Surgeon: Everitt Amber, MD;  Location: [facility];  Service: Gynecology;  Laterality: Bilateral;   TOTAL HIP ARTHROPLASTY Right 10/18/2017   Procedure: RIGHT TOTAL HIP ARTHROPLASTY ANTERIOR APPROACH;  Surgeon: Paralee Cancel, MD;  Location: [facility];  Service: Orthopedics;  Laterality: Right;  70 mins   TUBAL LIGATION     Social History:  reports that she quit smoking about 42 years ago. Her smoking use included cigarettes. She has never used smokeless tobacco. She reports current alcohol use of about 2.0 standard drinks per week. She  reports that she does not use drugs. Family History:  Family History  Problem Relation Age of Onset   Hypertension Father      HOME MEDICATIONS: Allergies as of 04/23/2021   No Known Allergies      Medication List        Accurate as of April 23, 2021 10:54 AM. If you have any questions, ask your nurse or doctor.          Ibuprofen-Famotidine 800-26.6 MG Tabs Take by mouth.   Insulin Pen Needle 32G X 4 MM Misc 1 Device by Does not apply route daily.   Lantus SoloStar 100 UNIT/ML Solostar Pen Generic drug: insulin glargine Inject 20 Units into the skin daily.   metFORMIN 500 MG 24 hr tablet Commonly known as: GLUCOPHAGE-XR Take 2 tablets (1,000 mg total) by mouth in the morning and at bedtime.   OneTouch Verio test strip Generic drug: glucose blood Use as instructed   rosuvastatin 20 MG tablet Commonly known as: CRESTOR Take 1 tablet (20 mg total) by mouth daily.   valsartan 80 MG tablet Commonly known as: Diovan Take 1 tablet (80 mg total) by mouth daily.         OBJECTIVE:   Vital Signs: BP (!) 142/96 (BP Location: Left Arm, Patient Position: Sitting, Cuff Size: Large)   Pulse 78   Ht 5' 8.5" (1.74 m)   Wt 258 lb 6.4 oz (117.2 kg)   SpO2 99%   BMI 38.72 kg/m   Wt Readings from Last 3 Encounters:  04/23/21 258 lb 6.4 oz (117.2 kg)  04/21/21 256 lb 12.8 oz (116.5 kg)  04/02/21 254 lb (115.2 kg)     Exam: General: Pt appears well and is in NAD  Lungs: Clear with good BS bilat with no rales, rhonchi, or wheezes  Heart: RRR with normal S1 and S2 and no gallops; no murmurs; no rub  Abdomen: Normoactive bowel sounds, soft, nontender, without masses or organomegaly palpable  Extremities: No pretibial edema.   Neuro: MS is good with appropriate affect, pt is alert and Ox3    DM foot exam: 01/16/2021     The skin of the feet is intact without sores or ulcerations. The pedal pulses are 2+ on right and 2+ on left. The sensation is intact to a  screening 5.07, 10 gram monofilament bilaterally      DATA REVIEWED:  Lab Results  Component Value Date   HGBA1C 6.6 (A) 04/23/2021   HGBA1C 10.7 (A) 01/16/2021   Lab Results  Component Value Date   LDLCALC 64 04/03/2021   CREATININE 0.71 10/19/2017   Results for CIARRAH, PETTERSSON (MRN [id number]) as of 04/24/2021 16:04  Ref. Range 04/23/2021 10:51 04/23/2021 11:21  MICROALB/CREAT RATIO Latest Ref Range: 0.0 - 30.0 mg/g  0.8  Prolactin Latest Units: ng/mL  20.7  Hemoglobin A1C Latest Ref Range: 4.0 - 5.6 % 6.6 (  A)     Lab Results  Component Value Date   CHOL 147 04/03/2021   HDL 64 04/03/2021   LDLCALC 64 04/03/2021   TRIG 106 04/03/2021   CHOLHDL 2.3 04/03/2021        BRAIN MRI 03/23/2005   1. Suspect 5 mm prolactinoma in the right inferior aspect of the gland with mild remodeling of the sellar floor as well as mild deviation of the pituitary stalk from right to left.  2. Otherwise negative cranial MRI  ASSESSMENT / PLAN / RECOMMENDATIONS:   1) Type 2 Diabetes Mellitus, optimally controlled, With out complications - Most recent A1c of 6.6%. Goal A1c < 7.0 %.    -I have praised the patient on improved glycemic control and optimizing her glucose within a short period of time -We discussed add-on therapy in an attempt to wean her off the insulin, she would like to avoid GLP-1 agonist due to risk of medullary cancer, she understands that she is at the very low risk for this but she again declines, we will start SGLT2 inhibitors, cautioned against genital infections  -We will make the following changes  MEDICATIONS: Continue metformin 500 mg 2 tablets a day Decrease Lantus to 16 units daily Start Farxiga 5 mg, 1 tablet daily in the morning  EDUCATION / INSTRUCTIONS: BG monitoring instructions: Patient is instructed to check her blood sugars 1 times a day, Fasting Call [facility] if: BG persistently < 70  I reviewed the Rule of 15 for the treatment of  hypoglycemia in detail with the patient. Literature supplied.   2) Diabetic complications:  Eye: Does not have known diabetic retinopathy.  Neuro/ Feet: Does not have known diabetic peripheral neuropathy .  Renal: Patient does not have known baseline CKD. She   is not on an ACEI/ARB at present.    3) Dyslipidemia:    - She was started on Statin therapy in 12/2020 -She is tolerating this without side effects   Continue Rosuvastatin 20 mg daily        4) Pituitary Microadenoma:    - She was on Bromocriptine for 3 decades, Has been off since 2021 - MRI from 2006 showed a 6 mm adenoma  - Repeat prolactin is normal in 12/2020 , and again on today's labs -No indication to stop bromocriptine therapy    F/U in 4 months   Signed electronically by: Mack Guise, MD  [facility]  [facility] [address] [place], [place] [address] Phone: [phone] FAX: [phone]   CC: Vania Rea, [place] [address] Phone: [phone]  Fax: [phone]  Return to Endocrinology clinic as below: Future Appointments  Date Time Provider [place]  05/07/2021 10:15 AM [facility]  05/28/2021  2:30 PM Clydell Hakim, RD [place] [facility]  07/10/2021 11:15 AM Liliane Shi, PA-C [facility]

## 2021-04-23 NOTE — Patient Instructions (Signed)
-   Keep Up the Good Work ! - Continue Metformin 500 mg TWO tablets a day  - Decrease Lantus to 16 units daily  - Start Farxiga 5 mg, 1 tablet daily in the morning     HOW TO TREAT LOW BLOOD SUGARS (Blood sugar LESS THAN 70 MG/DL) Please follow the RULE OF 15 for the treatment of hypoglycemia treatment (when your (blood sugars are less than 70 mg/dL)   STEP 1: Take 15 grams of carbohydrates when your blood sugar is low, which includes:  3-4 GLUCOSE TABS  OR 3-4 OZ OF JUICE OR REGULAR SODA OR ONE TUBE OF GLUCOSE GEL    STEP 2: RECHECK blood sugar in 15 MINUTES STEP 3: If your blood sugar is still low at the 15 minute recheck --> then, go back to STEP 1 and treat AGAIN with another 15 grams of carbohydrates.

## 2021-04-24 LAB — PROLACTIN: Prolactin: 20.7 ng/mL

## 2021-04-24 MED ORDER — LANTUS SOLOSTAR 100 UNIT/ML ~~LOC~~ SOPN: 16.0000 [IU] | PEN_INJECTOR | Freq: Every day | SUBCUTANEOUS | 11 refills | Status: DC

## 2021-05-05 ENCOUNTER — Ambulatory Visit: Payer: BC Managed Care – PPO | Admitting: Physician Assistant

## 2021-05-07 ENCOUNTER — Other Ambulatory Visit: Payer: Self-pay

## 2021-05-07 ENCOUNTER — Other Ambulatory Visit: Payer: BC Managed Care – PPO

## 2021-05-07 DIAGNOSIS — E7800 Pure hypercholesterolemia, unspecified: Secondary | ICD-10-CM | POA: Diagnosis not present

## 2021-05-07 DIAGNOSIS — I10 Essential (primary) hypertension: Secondary | ICD-10-CM | POA: Diagnosis not present

## 2021-05-07 LAB — BASIC METABOLIC PANEL
BUN/Creatinine Ratio: 21 (ref 12–28)
BUN: 14 mg/dL (ref 8–27)
CO2: 19 mmol/L — ABNORMAL LOW (ref 20–29)
Calcium: 9.4 mg/dL (ref 8.7–10.3)
Chloride: 107 mmol/L — ABNORMAL HIGH (ref 96–106)
Creatinine, Ser: 0.68 mg/dL (ref 0.57–1.00)
Glucose: 145 mg/dL — ABNORMAL HIGH (ref 65–99)
Potassium: 4.1 mmol/L (ref 3.5–5.2)
Sodium: 144 mmol/L (ref 134–144)
eGFR: 99 mL/min/{1.73_m2} (ref >59)

## 2021-05-28 ENCOUNTER — Ambulatory Visit: Payer: BC Managed Care – PPO | Admitting: Dietician

## 2021-07-09 DIAGNOSIS — I77810 Thoracic aortic ectasia: Secondary | ICD-10-CM | POA: Insufficient documentation

## 2021-07-09 DIAGNOSIS — I2584 Coronary atherosclerosis due to calcified coronary lesion: Secondary | ICD-10-CM | POA: Insufficient documentation

## 2021-07-09 DIAGNOSIS — E7800 Pure hypercholesterolemia, unspecified: Secondary | ICD-10-CM | POA: Insufficient documentation

## 2021-07-09 NOTE — Progress Notes (Deleted)
Cardiology Office Note:    Date:  07/09/2021   ID:  [name], DOB [birth year], MRN [id number]  PCP:  Vania Rea, MD   [facility] Providers Cardiologist:  Freada Bergeron, MD { Click to update primary MD,subspecialty MD or APP then REFRESH:1}  *** Referring MD: Vania Rea, MD   Chief Complaint:  No chief complaint on file. {Click here for Visit Info    :1}   Patient Profile:   Miranda Marquez is a 60 y.o. female with:  Coronary calcification (CT 8/22) CAC score 242.5 (95th percentile) Pituitary microadenoma Hypertension  Hyperlipidemia  Diabetes mellitus  Hx of gestation diabetes  Dilated ascending aorta (38 mm) on echocardiogram in 2017  CT 8/22: normal aorta   History of Present Illness: Miranda Marquez was last seen in 03/2021.  Her blood pressure was uncontrolled and I added valsartan to her medical regimen.  She returns for follow-up.***      ASSESSMENT & PLAN:   No problem-specific Assessment & Plan notes found for this encounter. 1. Essential hypertension BP is uncontrolled.  She was previously on an ACE inhibitor.  Goal BP < 130/80.  Start Valsartan 80 mg once daily.  BMET 2 weeks.  She plans to change her diet and increase activity; lose weight.  She knows to contact me if her BP is not coming down.  F/u in 3 mos.    2. Pure hypercholesterolemia Recent LDL 64.  Continue Rosuvastatin 20 mg once daily.    3. Coronary artery calcification Recent calcium score 242 placing her in the 95th percentile.  She is not having anginal symptoms.  Continue rosuvastatin 20 mg daily.   4. Dilated aorta 38 mm on echocardiogram in 2017.  Normal size and structure on recent echocardiogram.  Normal aorta on recent CT.   5. Diastolic dysfunction Recent echocardiogram with mild diastolic dysfunction.  There was evidence of elevated LVEDP with normal mean LA pressure).  However, she does not really have significant shortness of breath with exertion.  When her blood pressure was  significantly elevated, she did have some dyspnea.  But, her breathing has been stable since.  I will start valsartan as outlined above.  If her blood pressure does not reach goal with this, we could add low-dose HCTZ to help with her blood pressure.       {Are you ordering a CV Procedure (e.g. stress test, cath, DCCV, TEE, etc)?   Press F2        :[id number]}   Dispo:  No follow-ups on file.    Prior CV studies: CT CARDIAC SCORING 03/26/21 IMPRESSION: Coronary calcium score of 242.5. This was 95th percentile for age-, race-, and sex-matched controls. Normal Aorta   ECHOCARDIOGRAM 03/26/21 EF 62, no RWMA, GR 1 DD, GLS -21.3, normal RVSF, moderate LAE, trivial MR   GATED SPECT MYO PERF W/LEXISCAN STRESS 2D 05/14/2016 Normal pharmacologic nuclear study with no evidence of prior infarct or ischemia.  EF 53 {Select studies to display:26339}    Past Medical History:  Diagnosis Date   Arthritis    Cervical herniated disc    Complication of anesthesia    hypotension  in april after hysterectomy   Gestational diabetes    history..back 21 plus yrs ago.  Now she is ok   Hypertension    not taking meds    Pituitary microadenoma with hyperprolactinemia (HCC)    Psoriasis both elbows, scalp, left thigh, saw dr Allyson Sabal, last week given cream for areas healing  Sleep apnea    never had study done, but things she does have it   Current Medications: No outpatient medications have been marked as taking for the 07/10/21 encounter (Appointment) with Richardson Dopp T, PA-C.    Allergies:   Patient has no known allergies.   Social History   Tobacco Use   Smoking status: Former    Types: Cigarettes    Quit date: 08/23/1978    Years since quitting: 42.9   Smokeless tobacco: Never  Vaping Use   Vaping Use: Never used  Substance Use Topics   Alcohol use: Yes    Alcohol/week: 2.0 standard drinks    Types: 2 Glasses of wine per week    Comment: occasional, weekends   Drug use: No    Family  Hx: The patient's family history includes Hypertension in her father.  ROS   EKGs/Labs/Other Test Reviewed:    EKG:  EKG is *** ordered today.  The ekg ordered today demonstrates ***  Recent Labs: 01/16/2021: TSH 0.70 04/03/2021: ALT 18 05/07/2021: BUN 14; Creatinine, Ser 0.68; Potassium 4.1; Sodium 144   Recent Lipid Panel Lab Results  Component Value Date/Time   CHOL 147 04/03/2021 01:30 PM   TRIG 106 04/03/2021 01:30 PM   HDL 64 04/03/2021 01:30 PM   LDLCALC 64 04/03/2021 01:30 PM     Risk Assessment/Calculations:   {Does this patient have ATRIAL FIBRILLATION?:[id number]}      Physical Exam:    VS:  There were no vitals taken for this visit.    Wt Readings from Last 3 Encounters:  04/23/21 258 lb 6.4 oz (117.2 kg)  04/21/21 256 lb 12.8 oz (116.5 kg)  04/02/21 254 lb (115.2 kg)    Physical Exam ***    Medication Adjustments/Labs and Tests Ordered: Current medicines are reviewed at length with the patient today.  Concerns regarding medicines are outlined above.  Tests Ordered: No orders of the defined types were placed in this encounter.  Medication Changes: No orders of the defined types were placed in this encounter.  Signed, Richardson Dopp, PA-C  07/09/2021 2:30 PM    [facility] [address] [place], [place], [place]  [address] Phone: [phone]; Fax: [phone]

## 2021-07-10 ENCOUNTER — Ambulatory Visit: Payer: BC Managed Care – PPO | Admitting: Physician Assistant

## 2021-07-18 ENCOUNTER — Other Ambulatory Visit: Payer: Self-pay | Admitting: Internal Medicine

## 2021-07-18 DIAGNOSIS — E1165 Type 2 diabetes mellitus with hyperglycemia: Secondary | ICD-10-CM

## 2021-07-22 ENCOUNTER — Ambulatory Visit: Payer: BC Managed Care – PPO | Admitting: Physician Assistant

## 2021-08-28 ENCOUNTER — Ambulatory Visit: Payer: BC Managed Care – PPO | Admitting: Internal Medicine

## 2021-09-03 NOTE — Progress Notes (Deleted)
Name: Miranda Marquez  Age/ Sex: 61 y.o., female   MRN/ DOB: [id number], [birth year]     PCP: Vania Rea, MD   Reason for Endocrinology Evaluation: Type 2 Diabetes Mellitus  Initial Endocrine Consultative Visit: 01/16/2021    PATIENT IDENTIFIER: Miranda Marquez is a 62 y.o. female with a past medical history of HTN, T2DM, HTN and dyslipidemia. The patient has followed with Endocrinology clinic since 01/16/2021 for consultative assistance with management of her diabetes.  DIABETIC HISTORY:  Miranda Marquez was diagnosed with DM 07/2020.  She had gestations diabetes > 25 yrs ago. Her hemoglobin A1c has ranged from 10.2% in 2022, peaking at 12.4% in 2021  On her initial visit to our clinic she had an A1c of 10.7% she was not on any medications, we started metformin and insulin   By 04/2021 Wilder Glade started   PITUITARY HISTORY: She has a diagnosis of pituitary microadenoma in 2006. With an MRI showing a 5 mm adenoma . She has a Hx of hyperprolactinemia . She was on Bromocriptine until 2021 . Repeat Prolactin was normal x 2 in 2022 and we opted to hold off on Bromocriptine.      SUBJECTIVE:   During the last visit (04/23/2021): A1c 6.6% Continued  metformin, decreased  insulin and started [place]   Today (09/03/2021): Miranda Marquez  is here for a follow up on diabetes management. She checks her blood sugars 2 times daily. The patient has not had hypoglycemic episodes since the last clinic visit.  Denies nausea or diarrhea    HOME DIABETES REGIMEN:  Metformin 500 mg XR 2 tabs BID  Lantus 16 units daily  Farxiga 5 mg daily     Statin: yes ACE-I/ARB: no Prior Diabetic Education: no   GLUCOSE LOG:  114- 193 mg/dL in the past month    DIABETIC COMPLICATIONS: Microvascular complications:   Denies: CKD, neuropathy, retinopathy Last Eye Exam: Completed yrs ago   Macrovascular complications:   Denies: CAD, CVA, PVD   HISTORY:  Past Medical History:  Past Medical History:  Diagnosis Date    Arthritis    Cervical herniated disc    Complication of anesthesia    hypotension  in april after hysterectomy   Gestational diabetes    history..back 21 plus yrs ago.  Now she is ok   Hypertension    not taking meds    Pituitary microadenoma with hyperprolactinemia (HCC)    Psoriasis both elbows, scalp, left thigh, saw dr Allyson Sabal, last week given cream for areas healing   Sleep apnea    never had study done, but things she does have it   Past Surgical History:  Past Surgical History:  Procedure Laterality Date   ABDOMINAL HYSTERECTOMY     APPENDECTOMY     BREAST BIOPSY Right 10/11/2014   BREAST EXCISIONAL BIOPSY Right 07/2015   BREAST EXCISIONAL BIOPSY Right    BREAST LUMPECTOMY WITH RADIOACTIVE SEED LOCALIZATION Right 08/07/2015   Procedure: RIGHT BREAST LUMPECTOMY WITH RADIOACTIVE SEED LOCALIZATION AND EXCISION OF BREAST MASS X2;  Surgeon: Coralie Keens, MD;  Location: [place];  Service: General;  Laterality: Right;   BREAST SURGERY     DILATION AND CURETTAGE OF UTERUS     FOOT SURGERY     left heel     HERNIA REPAIR     umbilical   MAXIMUM ACCESS (MAS)POSTERIOR LUMBAR INTERBODY FUSION (PLIF) 1 LEVEL N/A 09/09/2016   Procedure: MAXIMUM ACCESS SURGERY POSTERIOR LUMBAR INTERBODY FUSION LUMBAR THREE-FOUR;  Surgeon: Camelia Eng  Ronnald Ramp, MD;  Location: [place];  Service: Neurosurgery;  Laterality: N/A;   MINI-LAPAROTOMY W/ TUBAL LIGATION     ROBOTIC ASSISTED TOTAL HYSTERECTOMY WITH BILATERAL SALPINGO OOPHERECTOMY Bilateral 12/03/2014   Procedure: ROBOTIC ASSISTED TOTAL HYSTERECTOMY WITH BILATERAL SALPINGO OOPHORECTOMY;  Surgeon: Everitt Amber, MD;  [facility];  Service: Gynecology;  Laterality: Bilateral;   TOTAL HIP ARTHROPLASTY Right 10/18/2017   Procedure: RIGHT TOTAL HIP ARTHROPLASTY ANTERIOR APPROACH;  Surgeon: Paralee Cancel, MD;  Location: [facility];  Service: Orthopedics;  Laterality: Right;  70 mins   TUBAL LIGATION     Social History:  reports that she quit smoking about 43 years  ago. Her smoking use included cigarettes. She has never used smokeless tobacco. She reports current alcohol use of about 2.0 standard drinks per week. She reports that she does not use drugs. Family History:  Family History  Problem Relation Age of Onset   Hypertension Father      HOME MEDICATIONS: Allergies as of 09/04/2021   No Known Allergies      Medication List        Accurate as of September 03, 2021  1:46 PM. If you have any questions, ask your nurse or doctor.          dapagliflozin propanediol 5 MG Tabs tablet Commonly known as: Farxiga Take 1 tablet (5 mg total) by mouth daily before breakfast.   Ibuprofen-Famotidine 800-26.6 MG Tabs Take by mouth.   Insulin Pen Needle 32G X 4 MM Misc 1 Device by Does not apply route daily.   Lantus SoloStar 100 UNIT/ML Solostar Pen Generic drug: insulin glargine Inject 16 Units into the skin daily.   metFORMIN 500 MG 24 hr tablet Commonly known as: GLUCOPHAGE-XR TAKE 2 TABLETS (1,000 MG TOTAL) BY MOUTH IN THE MORNING AND AT BEDTIME.   OneTouch Verio test strip Generic drug: glucose blood Use as instructed   rosuvastatin 20 MG tablet Commonly known as: CRESTOR Take 1 tablet (20 mg total) by mouth daily.   valsartan 80 MG tablet Commonly known as: Diovan Take 1 tablet (80 mg total) by mouth daily.         OBJECTIVE:   Vital Signs: There were no vitals taken for this visit.  Wt Readings from Last 3 Encounters:  04/23/21 258 lb 6.4 oz (117.2 kg)  04/21/21 256 lb 12.8 oz (116.5 kg)  04/02/21 254 lb (115.2 kg)     Exam: General: Pt appears well and is in NAD  Lungs: Clear with good BS bilat with no rales, rhonchi, or wheezes  Heart: RRR with normal S1 and S2 and no gallops; no murmurs; no rub  Abdomen: Normoactive bowel sounds, soft, nontender, without masses or organomegaly palpable  Extremities: No pretibial edema.   Neuro: MS is good with appropriate affect, pt is alert and Ox3    DM foot exam:  01/16/2021     The skin of the feet is intact without sores or ulcerations. The pedal pulses are 2+ on right and 2+ on left. The sensation is intact to a screening 5.07, 10 gram monofilament bilaterally      DATA REVIEWED:  Lab Results  Component Value Date   HGBA1C 6.6 (A) 04/23/2021   HGBA1C 10.7 (A) 01/16/2021   Lab Results  Component Value Date   MICROALBUR 0.8 04/23/2021   LDLCALC 64 04/03/2021   CREATININE 0.68 05/07/2021   Results for CYDNE, GRAHN (MRN [id number]) as of 04/24/2021 16:04  Ref. Range 04/23/2021 10:51 04/23/2021 11:21  MICROALB/CREAT  RATIO Latest Ref Range: 0.0 - 30.0 mg/g  0.8  Prolactin Latest Units: ng/mL  20.7  Hemoglobin A1C Latest Ref Range: 4.0 - 5.6 % 6.6 (A)     Lab Results  Component Value Date   CHOL 147 04/03/2021   HDL 64 04/03/2021   LDLCALC 64 04/03/2021   TRIG 106 04/03/2021   CHOLHDL 2.3 04/03/2021        BRAIN MRI 03/23/2005   1. Suspect 5 mm prolactinoma in the right inferior aspect of the gland with mild remodeling of the sellar floor as well as mild deviation of the pituitary stalk from right to left.  2. Otherwise negative cranial MRI  ASSESSMENT / PLAN / RECOMMENDATIONS:   1) Type 2 Diabetes Mellitus, optimally controlled, With out complications - Most recent A1c of 6.6%. Goal A1c < 7.0 %.    -I have praised the patient on improved glycemic control and optimizing her glucose within a short period of time -We discussed add-on therapy in an attempt to wean her off the insulin, she would like to avoid GLP-1 agonist due to risk of medullary cancer, she understands that she is at the very low risk for this but she again declines, we will start SGLT2 inhibitors, cautioned against genital infections  -We will make the following changes  MEDICATIONS: Continue metformin 500 mg 2 tablets a day Decrease Lantus to 16 units daily Start Farxiga 5 mg, 1 tablet daily in the morning  EDUCATION / INSTRUCTIONS: BG monitoring instructions:  Patient is instructed to check her blood sugars 1 times a day, Fasting Call [place] [facility] if: BG persistently < 70  I reviewed the Rule of 15 for the treatment of hypoglycemia in detail with the patient. Literature supplied.   2) Diabetic complications:  Eye: Does not have known diabetic retinopathy.  Neuro/ Feet: Does not have known diabetic peripheral neuropathy .  Renal: Patient does not have known baseline CKD. She   is not on an ACEI/ARB at present.    3) Dyslipidemia:    - She was started on Statin therapy in 12/2020 -She is tolerating this without side effects   Continue Rosuvastatin 20 mg daily        4) Pituitary Microadenoma:    - She was on Bromocriptine for 3 decades, Has been off since 2021 - MRI from 2006 showed a 6 mm adenoma  - Repeat prolactin is normal in 12/2020 , and again on today's labs -No indication to stop bromocriptine therapy    F/U in 4 months   Signed electronically by: Mack Guise, MD  [facility]  [facility] [address] [place], [place] [address] Phone: [phone] FAX: [phone]   CC: Vania Rea, [place] [address] Phone: [phone]  Fax: [phone]  Return to Endocrinology clinic as below: Future Appointments  Date Time Provider [place]  09/04/2021  9:50 AM Loetta Connelley, Melanie Crazier, MD [facility] None  10/09/2021 11:20 AM Liliane Shi, PA-C [facility]

## 2021-09-04 ENCOUNTER — Ambulatory Visit: Payer: BC Managed Care – PPO | Admitting: Internal Medicine

## 2021-09-09 ENCOUNTER — Encounter: Payer: Self-pay | Admitting: Internal Medicine

## 2021-09-09 ENCOUNTER — Ambulatory Visit (INDEPENDENT_AMBULATORY_CARE_PROVIDER_SITE_OTHER): Payer: BC Managed Care – PPO | Admitting: Internal Medicine

## 2021-09-09 ENCOUNTER — Other Ambulatory Visit: Payer: Self-pay

## 2021-09-09 VITALS — BP 146/80 | HR 84 | Ht 68.5 in | Wt 264.0 lb

## 2021-09-09 DIAGNOSIS — E1165 Type 2 diabetes mellitus with hyperglycemia: Secondary | ICD-10-CM

## 2021-09-09 DIAGNOSIS — E785 Hyperlipidemia, unspecified: Secondary | ICD-10-CM | POA: Diagnosis not present

## 2021-09-09 DIAGNOSIS — E119 Type 2 diabetes mellitus without complications: Secondary | ICD-10-CM

## 2021-09-09 DIAGNOSIS — D352 Benign neoplasm of pituitary gland: Secondary | ICD-10-CM | POA: Diagnosis not present

## 2021-09-09 LAB — MICROALBUMIN / CREATININE URINE RATIO
Creatinine,U: 60.2 mg/dL
Microalb Creat Ratio: 1.2 mg/g (ref 0.0–30.0)
Microalb, Ur: <0.7 mg/dL (ref 0.0–1.9)

## 2021-09-09 LAB — POCT GLYCOSYLATED HEMOGLOBIN (HGB A1C): Hemoglobin A1C: 6.5 % — AB (ref 4.0–5.6)

## 2021-09-09 LAB — BASIC METABOLIC PANEL
BUN: 14 mg/dL (ref 6–23)
CO2: 26 mEq/L (ref 19–32)
Calcium: 9.4 mg/dL (ref 8.4–10.5)
Chloride: 104 mEq/L (ref 96–112)
Creatinine, Ser: 0.65 mg/dL (ref 0.40–1.20)
GFR: 94.62 mL/min (ref >60.00)
Glucose, Bld: 139 mg/dL — ABNORMAL HIGH (ref 70–99)
Potassium: 4.1 mEq/L (ref 3.5–5.1)
Sodium: 138 mEq/L (ref 135–145)

## 2021-09-09 LAB — POCT GLUCOSE (DEVICE FOR HOME USE): Glucose Fasting, POC: 153 mg/dL — AB (ref 70–99)

## 2021-09-09 MED ORDER — DAPAGLIFLOZIN PROPANEDIOL 10 MG PO TABS: 10.0000 mg | ORAL_TABLET | Freq: Every day | ORAL | 3 refills | Status: DC

## 2021-09-09 MED ORDER — LANTUS SOLOSTAR 100 UNIT/ML ~~LOC~~ SOPN: 12.0000 [IU] | PEN_INJECTOR | Freq: Every day | SUBCUTANEOUS | 11 refills | Status: DC

## 2021-09-09 NOTE — Progress Notes (Signed)
Name: Miranda Marquez  Age/ Sex: 61 y.o., female   MRN/ DOB: [id number], August 11, 1961     PCP: Vania Rea, MD   Reason for Endocrinology Evaluation: Type 2 Diabetes Mellitus  Initial Endocrine Consultative Visit: 01/16/2021    PATIENT IDENTIFIER: Miranda Marquez is a 61 y.o. female with a past medical history of HTN, T2DM, HTN and dyslipidemia. The patient has followed with Endocrinology clinic since 01/16/2021 for consultative assistance with management of her diabetes.  DIABETIC HISTORY:  Miranda Marquez was diagnosed with DM 07/2020.  She had gestations diabetes > 25 yrs ago. Her hemoglobin A1c has ranged from 10.2% in 2022, peaking at 12.4% in 2021  On her initial visit to our clinic she had an A1c of 10.7% she was not on any medications, we started metformin and insulin   By 04/2021 Miranda Marquez started   PITUITARY HISTORY: She has a diagnosis of pituitary microadenoma in 2006. With an MRI showing a 5 mm adenoma . She has a Hx of hyperprolactinemia . She was on Bromocriptine until 2021 . Repeat Prolactin was normal x 2 in 2022 and we opted to hold off on Bromocriptine.      SUBJECTIVE:   During the last visit (04/23/2021): A1c 6.6% Continued  metformin, decreased  insulin and started [place]      Today (09/09/2021): Miranda Marquez  is here for a follow up on diabetes management. She checks her blood sugars 2 times daily. The patient has not had hypoglycemic episodes since the last clinic visit.  Denies nausea or diarrhea  She has been noted with weight gain  Denies headaches  Denies vision changes     HOME DIABETES REGIMEN:  Metformin 500 mg XR 2 tabs BID  Lantus 16 units daily  Farxiga 5 mg daily     Statin: yes ACE-I/ARB: no Prior Diabetic Education: no   GLUCOSE LOG:  114- 193 mg/dL in the past month    DIABETIC COMPLICATIONS: Microvascular complications:   Denies: CKD, neuropathy, retinopathy Last Eye Exam: Completed yrs ago   Macrovascular complications:   Denies:  CAD, CVA, PVD   HISTORY:  Past Medical History:  Past Medical History:  Diagnosis Date   Arthritis    Cervical herniated disc    Complication of anesthesia    hypotension  in april after hysterectomy   Gestational diabetes    history..back 21 plus yrs ago.  Now she is ok   Hypertension    not taking meds    Pituitary microadenoma with hyperprolactinemia (HCC)    Psoriasis both elbows, scalp, left thigh, saw dr Allyson Sabal, last week given cream for areas healing   Sleep apnea    never had study done, but things she does have it   Past Surgical History:  Past Surgical History:  Procedure Laterality Date   ABDOMINAL HYSTERECTOMY     APPENDECTOMY     BREAST BIOPSY Right 10/11/2014   BREAST EXCISIONAL BIOPSY Right 07/2015   BREAST EXCISIONAL BIOPSY Right    BREAST LUMPECTOMY WITH RADIOACTIVE SEED LOCALIZATION Right 08/07/2015   Procedure: RIGHT BREAST LUMPECTOMY WITH RADIOACTIVE SEED LOCALIZATION AND EXCISION OF BREAST MASS X2;  Surgeon: Coralie Keens, MD;  Location: [place];  Service: General;  Laterality: Right;   BREAST SURGERY     DILATION AND CURETTAGE OF UTERUS     FOOT SURGERY     left heel     HERNIA REPAIR     umbilical   MAXIMUM ACCESS (MAS)POSTERIOR LUMBAR INTERBODY FUSION (PLIF) 1  LEVEL N/A 09/09/2016   Procedure: MAXIMUM ACCESS SURGERY POSTERIOR LUMBAR INTERBODY FUSION LUMBAR THREE-FOUR;  Surgeon: Eustace Moore, MD;  Location: [place];  Service: Neurosurgery;  Laterality: N/A;   MINI-LAPAROTOMY W/ TUBAL LIGATION     ROBOTIC ASSISTED TOTAL HYSTERECTOMY WITH BILATERAL SALPINGO OOPHERECTOMY Bilateral 12/03/2014   Procedure: ROBOTIC ASSISTED TOTAL HYSTERECTOMY WITH BILATERAL SALPINGO OOPHORECTOMY;  Surgeon: Everitt Amber, MD;  [facility];  Service: Gynecology;  Laterality: Bilateral;   TOTAL HIP ARTHROPLASTY Right 10/18/2017   Procedure: RIGHT TOTAL HIP ARTHROPLASTY ANTERIOR APPROACH;  Surgeon: Paralee Cancel, MD;  [facility];  Service: Orthopedics;  Laterality: Right;   70 mins   TUBAL LIGATION     Social History:  reports that she quit smoking about 43 years ago. Her smoking use included cigarettes. She has never used smokeless tobacco. She reports current alcohol use of about 2.0 standard drinks per week. She reports that she does not use drugs. Family History:  Family History  Problem Relation Age of Onset   Hypertension Father      HOME MEDICATIONS: Allergies as of 09/09/2021   No Known Allergies      Medication List        Accurate as of September 09, 2021  9:14 AM. If you have any questions, ask your nurse or doctor.          dapagliflozin propanediol 5 MG Tabs tablet Commonly known as: Farxiga Take 1 tablet (5 mg total) by mouth daily before breakfast.   Ibuprofen-Famotidine 800-26.6 MG Tabs Take by mouth.   Insulin Pen Needle 32G X 4 MM Misc 1 Device by Does not apply route daily.   Lantus SoloStar 100 UNIT/ML Solostar Pen Generic drug: insulin glargine Inject 16 Units into the skin daily.   metFORMIN 500 MG 24 hr tablet Commonly known as: GLUCOPHAGE-XR TAKE 2 TABLETS (1,000 MG TOTAL) BY MOUTH IN THE MORNING AND AT BEDTIME.   OneTouch Verio test strip Generic drug: glucose blood Use as instructed   rosuvastatin 20 MG tablet Commonly known as: CRESTOR Take 1 tablet (20 mg total) by mouth daily.   valsartan 80 MG tablet Commonly known as: Diovan Take 1 tablet (80 mg total) by mouth daily.         OBJECTIVE:   Vital Signs: BP (!) 146/80 (BP Location: Left Arm, Patient Position: Sitting, Cuff Size: Large)    Pulse 84    Ht 5' 8.5" (1.74 m)    Wt 264 lb (119.7 kg)    SpO2 96%    BMI 39.56 kg/m   Wt Readings from Last 3 Encounters:  09/09/21 264 lb (119.7 kg)  04/23/21 258 lb 6.4 oz (117.2 kg)  04/21/21 256 lb 12.8 oz (116.5 kg)     Exam: General: Pt appears well and is in NAD  Lungs: Clear with good BS bilat with no rales, rhonchi, or wheezes  Heart: RRR with normal S1 and S2 and no gallops; no murmurs; no  rub  Abdomen: Normoactive bowel sounds, soft, nontender, without masses or organomegaly palpable  Extremities: No pretibial edema.   Neuro: MS is good with appropriate affect, pt is alert and Ox3    DM foot exam: 01/16/2021     The skin of the feet is intact without sores or ulcerations. The pedal pulses are 2+ on right and 2+ on left. The sensation is intact to a screening 5.07, 10 gram monofilament bilaterally      DATA REVIEWED:  Lab Results  Component Value Date  HGBA1C 6.6 (A) 04/23/2021   HGBA1C 10.7 (A) 01/16/2021       Latest Reference Range & Units 09/09/21 09:41  Sodium 135 - 145 mEq/L 138  Potassium 3.5 - 5.1 mEq/L 4.1  Chloride 96 - 112 mEq/L 104  CO2 19 - 32 mEq/L 26  Glucose 70 - 99 mg/dL 139 (H)  BUN 6 - 23 mg/dL 14  Creatinine 0.40 - 1.20 mg/dL 0.65  Calcium 8.4 - 10.5 mg/dL 9.4  GFR >60.00 mL/min 94.62    Latest Reference Range & Units 09/09/21 09:41  MICROALB/CREAT RATIO 0.0 - 30.0 mg/g 1.2    Latest Reference Range & Units 09/09/21 09:41  Prolactin ng/mL 19.0      BRAIN MRI 03/23/2005   1. Suspect 5 mm prolactinoma in the right inferior aspect of the gland with mild remodeling of the sellar floor as well as mild deviation of the pituitary stalk from right to left.  2. Otherwise negative cranial MRI  ASSESSMENT / PLAN / RECOMMENDATIONS:   1) Type 2 Diabetes Mellitus, optimally controlled, With out complications - Most recent A1c of 6.5 %. Goal A1c < 7.0 %.    -I have praised the patient on improved glycemic control and optimizing her glucose  -She had declined GLP-1 agonist due to risk of medullary cancer, she understands that she is at the very low risk  - [employer] , will increase ans decrease insulin    MEDICATIONS: Continue metformin 500 mg 2 tablets BID  Decrease Lantus to 12 units daily Increase  Farxiga 10 mg, 1 tablet daily in the morning  EDUCATION / INSTRUCTIONS: BG monitoring instructions: Patient is instructed to check  her blood sugars 1 times a day, Fasting Call [facility] if: BG persistently < 70  I reviewed the Rule of 15 for the treatment of hypoglycemia in detail with the patient. Literature supplied.   2) Diabetic complications:  Eye: Does not have known diabetic retinopathy.  Neuro/ Feet: Does not have known diabetic peripheral neuropathy .  Renal: Patient does not have known baseline CKD. She   is not on an ACEI/ARB at present.    3) Dyslipidemia:    - She was started on Statin therapy in 12/2020 -She is tolerating this without side effects   Continue Rosuvastatin 20 mg daily        4) Pituitary Microadenoma:    - She was on Bromocriptine for 3 decades, Has been off since 2021 - MRI from 2006 showed a 6 mm adenoma  - Repeat prolactin is normal in 12/2020 , and again on today's labs -No indication to start  bromocriptine therapy    F/U in 6 months   Signed electronically by: Mack Guise, MD  [facility]  [facility] [address] [place], [place] [address] Phone: [phone] FAX: [phone]   CC: Vania Rea, [place] [address] Phone: [phone]  Fax: [phone]  Return to Endocrinology clinic as below: Future Appointments  Date Time Provider [place]  10/09/2021 11:20 AM Liliane Shi, PA-C [facility]

## 2021-09-09 NOTE — Patient Instructions (Addendum)
-   Keep Up the Good Work ! - Continue Metformin 500 mg TWO tablets Twice daily  - Decrease Lantus to 12 units daily  - Increase  Farxiga 10 mg, 1 tablet daily in the morning     HOW TO TREAT LOW BLOOD SUGARS (Blood sugar LESS THAN 70 MG/DL) Please follow the RULE OF 15 for the treatment of hypoglycemia treatment (when your (blood sugars are less than 70 mg/dL)   STEP 1: Take 15 grams of carbohydrates when your blood sugar is low, which includes:  3-4 GLUCOSE TABS  OR 3-4 OZ OF JUICE OR REGULAR SODA OR ONE TUBE OF GLUCOSE GEL    STEP 2: RECHECK blood sugar in 15 MINUTES STEP 3: If your blood sugar is still low at the 15 minute recheck --> then, go back to STEP 1 and treat AGAIN with another 15 grams of carbohydrates.

## 2021-09-10 DIAGNOSIS — E119 Type 2 diabetes mellitus without complications: Secondary | ICD-10-CM | POA: Insufficient documentation

## 2021-09-10 DIAGNOSIS — E785 Hyperlipidemia, unspecified: Secondary | ICD-10-CM | POA: Insufficient documentation

## 2021-09-10 LAB — PROLACTIN: Prolactin: 19 ng/mL

## 2021-09-10 MED ORDER — METFORMIN HCL ER 500 MG PO TB24: 1000.0000 mg | ORAL_TABLET | Freq: Two times a day (BID) | ORAL | 3 refills | Status: DC

## 2021-09-15 IMAGING — MG DIGITAL DIAGNOSTIC BILAT W/ TOMO W/ CAD
6 of 10 series · 6 of 30 positions shown · non-contrast
Comparison: Previous exam(s).

CLINICAL DATA: Patient has a palpable dark lump in the LEFT breast
for couple of months. Patient was able to express a small amount of
fluid from this mass. She also has a dark lesion on the LEFT nipple.
She reports lesion has been stable for 2 years but was not present
previously. She reports chronic non spontaneous nipple discharge
related to a pituitary lesion. She takes Bromocryptine. Patient
denies spontaneous nipple discharge. History of excised RIGHT breast
papilloma.

EXAM:
DIGITAL DIAGNOSTIC BILATERAL MAMMOGRAM WITH CAD AND TOMO
ULTRASOUND LEFT BREAST

[L MLO synth-2D]
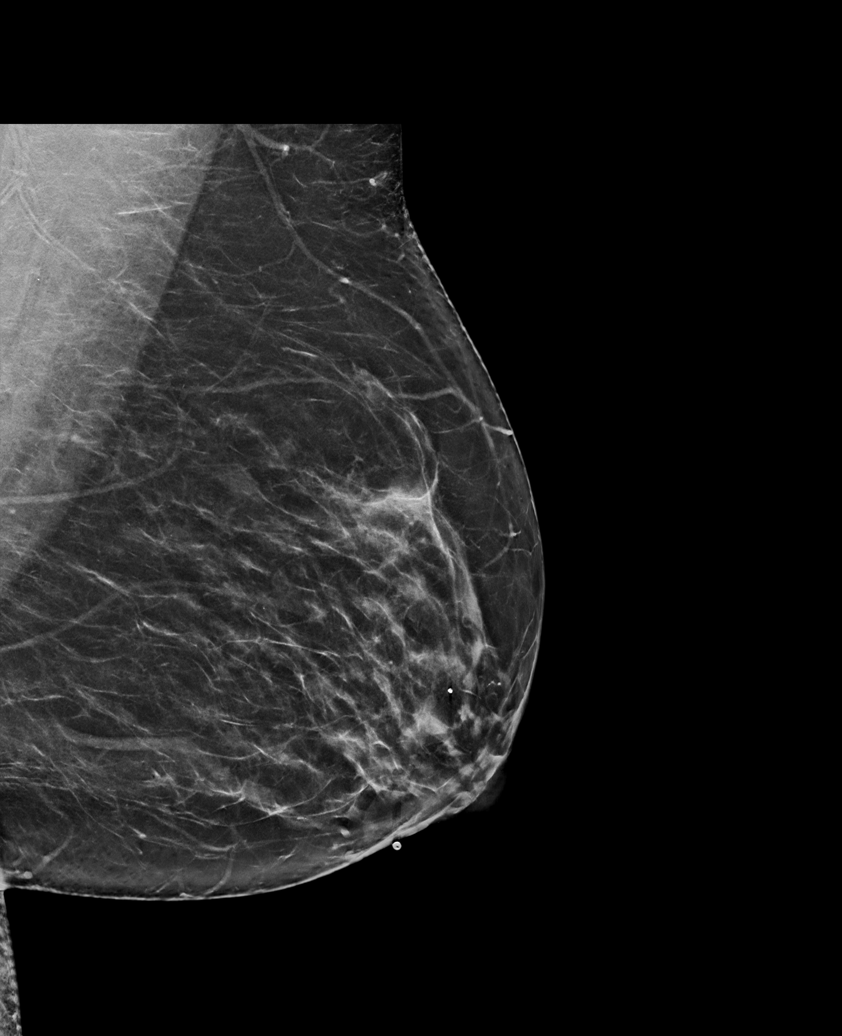

[R CC synth-2D]
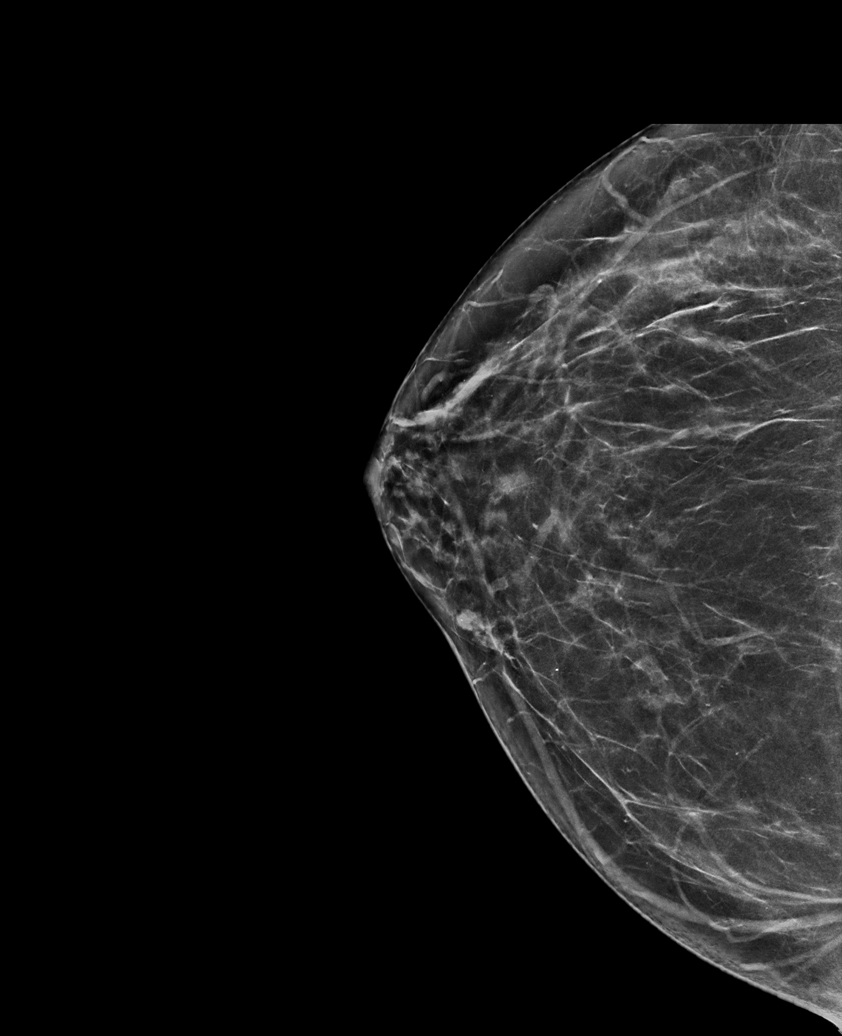

[R MLO synth-2D]
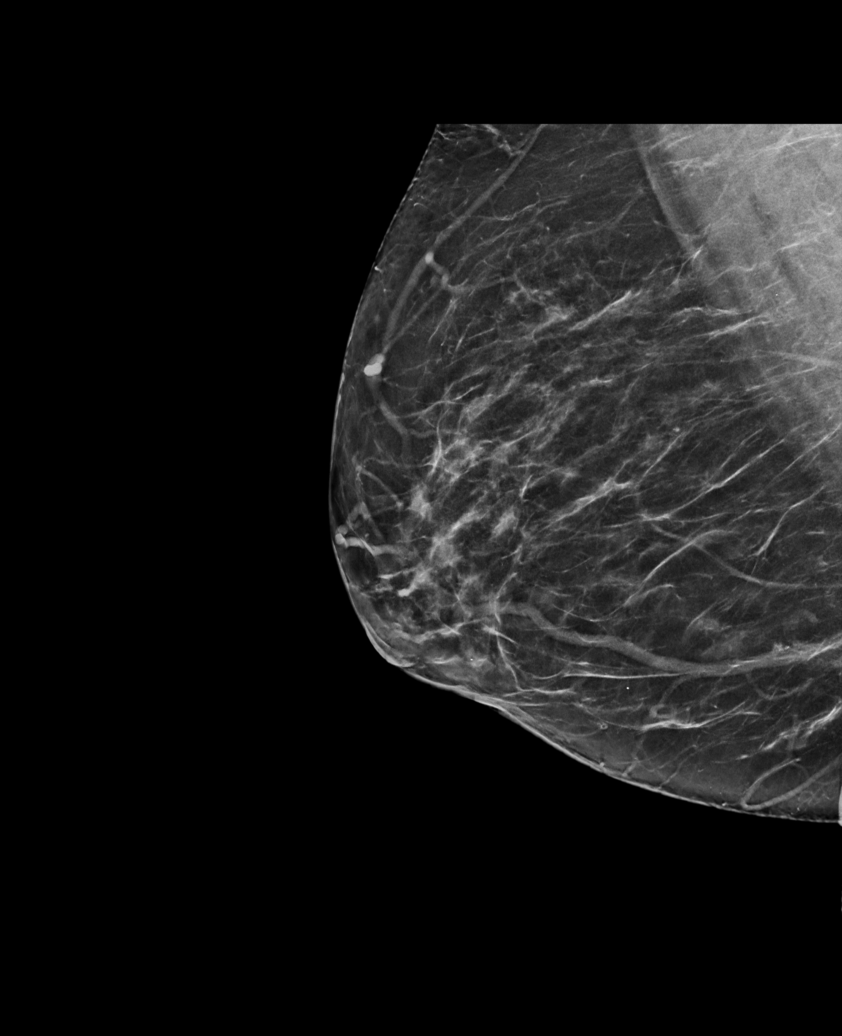

[L TAN synth-2D]
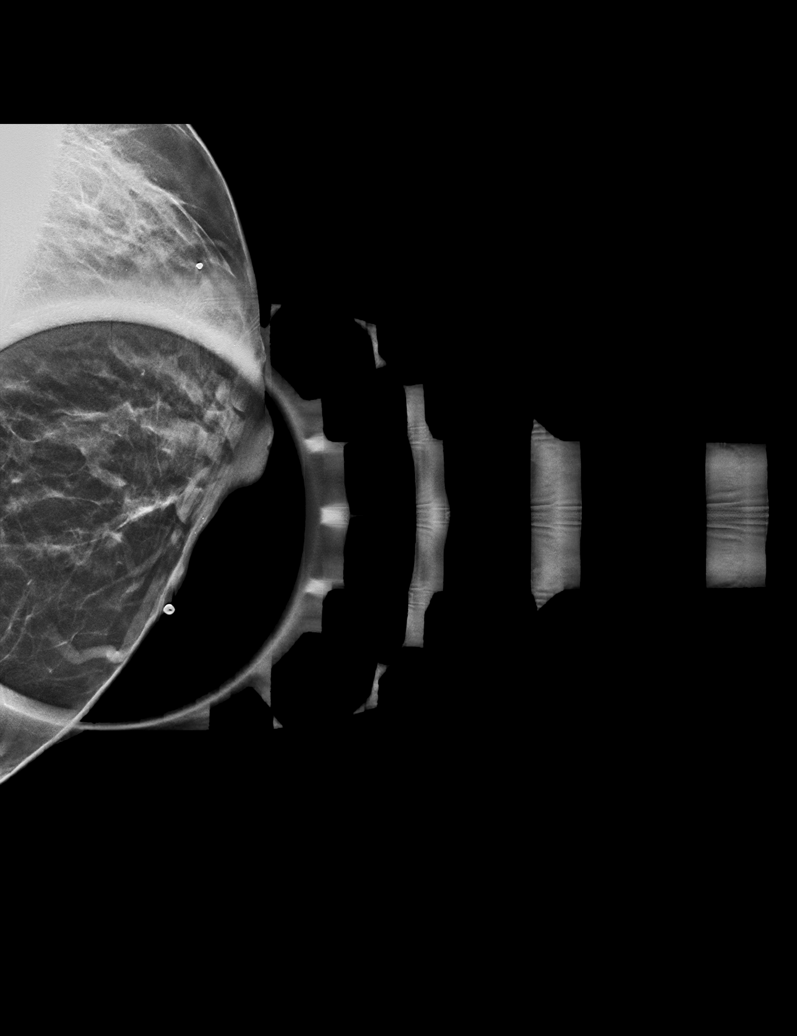

[L CC synth-2D]
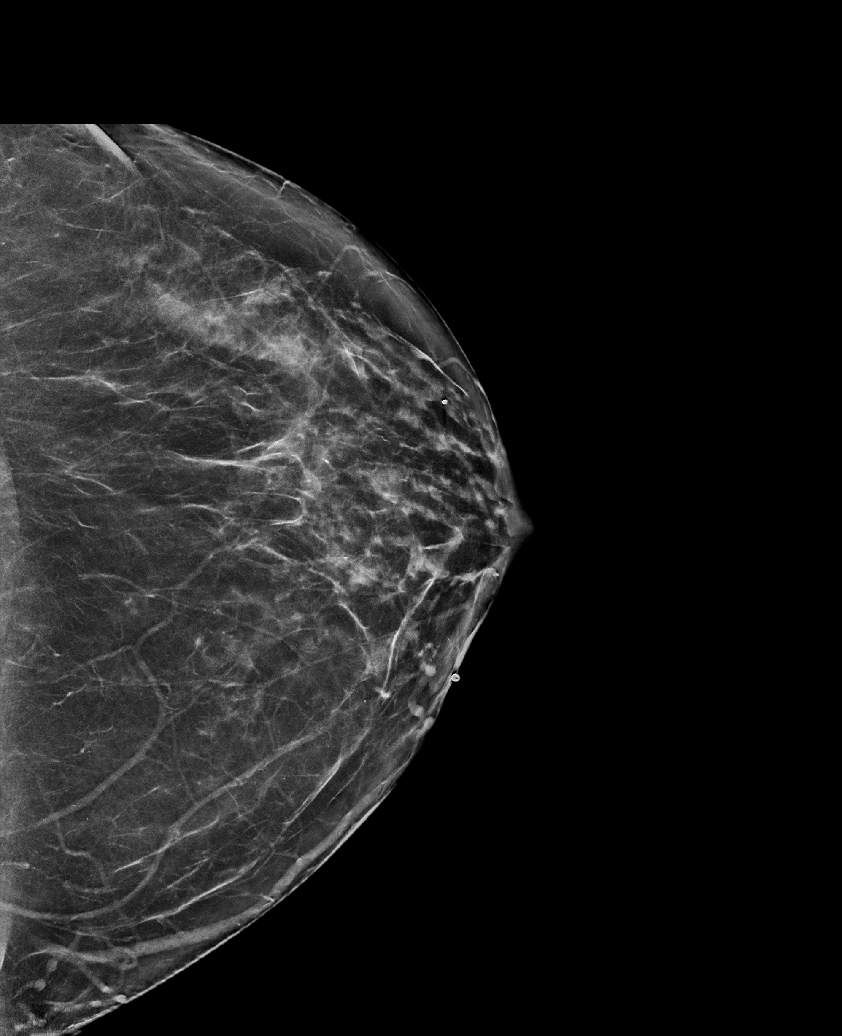

[L TAN tomo · tomo slice 19/37.0]
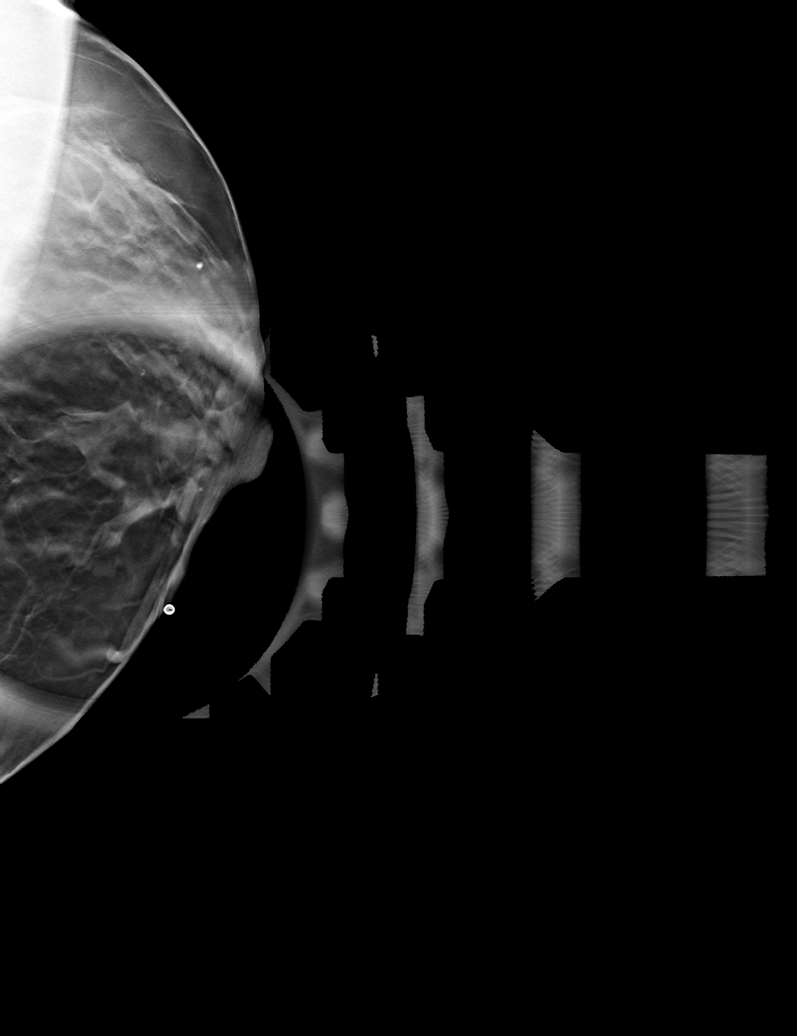

[6 of 30 positions shown; findings below may reference images not displayed]

ACR Breast Density Category b: There are scattered areas of
fibroglandular density.
FINDINGS: No suspicious mass, distortion, or microcalcifications are
identified to suggest presence of malignancy. Spot tangential view
is performed in the area of patient's concern just MEDIAL to the
LEFT nipple. This view shows no discrete mass. There are mildly
prominent ducts in the retroareolar region of the LEFT breast.

Mammographic images were processed with CAD.

On physical exam, there is a visible hyper pigmented papule in the
9:30 o'clock location of the LEFT breast 3 centimeters from the
nipple corresponding to the area of patient's concern. Additionally
there is a 3 millimeter hyperpigmented lesion in the 3 o'clock
location on the face of the LEFT nipple, not associated with scaling
or skin thickening.

Targeted ultrasound is performed, showing intradermal hypoechoic
collection in the 9:30 o'clock location of the RIGHT breast
corresponding to the area of patient's concern. This collection
measures 4 millimeters maximum diameter in is consistent with benign
intradermal inclusion cyst.

In the retroareolar region of the LEFT breast, there are mildly
prominent ducts containing debris. A solid mass with internal blood
flow is identified in the 10 o'clock retroareolar region of the LEFT
breast and measures 4 millimeters in diameter.

Evaluation of the LEFT axilla is negative for adenopathy.
IMPRESSION: 1. Intraductal mass in the 10 o'clock retroareolar region of the
LEFT breast warranting tissue diagnosis. We discussed the
possibility that this represents an intraductal papilloma which may
need further excision.
2. Hyper pigmented lesion on the face of the LEFT nipple.

RECOMMENDATION:
1. Recommend ultrasound-guided core biopsy of mass in the 10 o'clock
location of the LEFT breast.
2. Recommend surgical consultation and possible punch biopsy for
LEFT nipple lesion. Consultation can be arranged after biopsy.

I have discussed the findings and recommendations with the patient.
If applicable, a reminder letter will be sent to the patient
regarding the next appointment.

BI-RADS CATEGORY  4: Suspicious.

## 2021-09-22 ENCOUNTER — Other Ambulatory Visit: Payer: Self-pay

## 2021-09-22 DIAGNOSIS — R7309 Other abnormal glucose: Secondary | ICD-10-CM

## 2021-09-22 DIAGNOSIS — R011 Cardiac murmur, unspecified: Secondary | ICD-10-CM

## 2021-09-22 DIAGNOSIS — I10 Essential (primary) hypertension: Secondary | ICD-10-CM

## 2021-09-22 DIAGNOSIS — E785 Hyperlipidemia, unspecified: Secondary | ICD-10-CM

## 2021-09-22 MED ORDER — ROSUVASTATIN CALCIUM 20 MG PO TABS: 20.0000 mg | ORAL_TABLET | Freq: Every day | ORAL | 2 refills | Status: DC

## 2021-10-09 ENCOUNTER — Ambulatory Visit: Payer: BC Managed Care – PPO | Admitting: Physician Assistant

## 2021-11-25 NOTE — Progress Notes (Deleted)
?Cardiology Office Note:   ? ?Date:  11/25/2021  ? ?ID:  [name], DOB [birth year], MRN [id number] ? ?PCP:  Vania Rea, MD ?  ?[facility] Providers ?Cardiologist:  Freada Bergeron, MD { ? ?Referring MD: Vania Rea, MD  ? ? ?History of Present Illness:   ? ?Miranda Marquez is a 61 y.o. 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 Meda Coffee in 2017 who now presents to clinic for follow-up. ? ?Saw Dr. Meda Coffee in 2017 where she was experiencing dyspnea on exertion. TTE with LVEF 55-60%, G1DD, mild ascending aortic aneurysm 83m. Myoview negative for ischemia. She was lost to follow-up. ? ?Returned to clinic in 12/2020 after being diagnosed with HLD and DMII. Specifically, her LDL cholesterol is  170, and her hemoglobin A1C was 2.4. Ca score 03/2021 242 which was 95% for age, gender, race matched controls. TTE 03/2021 with LVEF 60-65%, G1DD, GLS -21.3, moderate LAE, normal RV, trivial MR. We started her on crestor and metformin at that time.  ? ?Last saw SRichardson Dopp PA-C on 03/2021 where she was having elevated blood pressures. She was started on valsartan for BP control. ? ?Today, *** ? ?Past Medical History:  ?Diagnosis Date  ? Arthritis   ? Cervical herniated disc   ? Complication of anesthesia   ? hypotension  in april after hysterectomy  ? Gestational diabetes   ? history..back 21 plus yrs ago.  Now she is ok  ? Hypertension   ? not taking meds   ? Pituitary microadenoma with hyperprolactinemia ([place]   ? Psoriasis both elbows, scalp, left thigh, saw dr lAllyson Sabal last week given cream for areas healing  ? Sleep apnea   ? never had study done, but things she does have it  ? ? ?Past Surgical History:  ?Procedure Laterality Date  ? ABDOMINAL HYSTERECTOMY    ? APPENDECTOMY    ? BREAST BIOPSY Right 10/11/2014  ? BREAST EXCISIONAL BIOPSY Right 07/2015  ? BREAST EXCISIONAL BIOPSY Right   ? BREAST LUMPECTOMY WITH RADIOACTIVE SEED LOCALIZATION Right  08/07/2015  ? Procedure: RIGHT BREAST LUMPECTOMY WITH RADIOACTIVE SEED LOCALIZATION AND EXCISION OF BREAST MASS X2;  Surgeon: DCoralie Keens MD;  [place] [facility]  Service: General;  Laterality: Right;  ? BREAST SURGERY    ? DILATION AND CURETTAGE OF UTERUS    ? FOOT SURGERY    ? left heel    ? HERNIA REPAIR    ? umbilical  ? MAXIMUM ACCESS (MAS)POSTERIOR LUMBAR INTERBODY FUSION (PLIF) 1 LEVEL N/A 09/09/2016  ? Procedure: MAXIMUM ACCESS SURGERY POSTERIOR LUMBAR INTERBODY FUSION LUMBAR THREE-FOUR;  Surgeon: DEustace Moore MD;  [place] [facility]  Service: Neurosurgery;  Laterality: N/A;  ? MINI-LAPAROTOMY W/ TUBAL LIGATION    ? ROBOTIC ASSISTED TOTAL HYSTERECTOMY WITH BILATERAL SALPINGO OOPHERECTOMY Bilateral 12/03/2014  ? Procedure: ROBOTIC ASSISTED TOTAL HYSTERECTOMY WITH BILATERAL SALPINGO OOPHORECTOMY;  Surgeon: EEveritt Amber MD;  [facility];  Service: Gynecology;  Laterality: Bilateral;  ? TOTAL HIP ARTHROPLASTY Right 10/18/2017  ? Procedure: RIGHT TOTAL HIP ARTHROPLASTY ANTERIOR APPROACH;  Surgeon: OParalee Cancel MD;  Location: [facility];  Service: Orthopedics;  Laterality: Right;  70 mins  ? TUBAL LIGATION    ? ? ?Current Medications: ?No outpatient medications have been marked as taking for the 11/27/21 encounter (Appointment) with PFreada Bergeron MD.  ?  ? ?Allergies:   Patient has no known allergies.  ? ?Social History  ? ?  Socioeconomic History  ? Marital status: Married  ?  Spouse name: Not on file  ? Number of children: Not on file  ? Years of education: Not on file  ? Highest education level: Not on file  ?Occupational History  ? Not on file  ?Tobacco Use  ? Smoking status: Former  ?  Types: Cigarettes  ?  Quit date: 08/23/1978  ?  Years since quitting: 43.2  ? Smokeless tobacco: Never  ?Vaping Use  ? Vaping Use: Never used  ?Substance and Sexual Activity  ? Alcohol use: Yes  ?  Alcohol/week: 2.0 standard drinks  ?  Types: 2 Glasses of wine per week  ?  Comment: occasional, weekends  ? Drug use: No  ?  Sexual activity: Yes  ?Other Topics Concern  ? Not on file  ?Social History Narrative  ? Not on file  ? ?Social Determinants of Health  ? ?Financial Resource Strain: Not on file  ?Food Insecurity: Not on file  ?Transportation Needs: Not on file  ?Physical Activity: Not on file  ?Stress: Not on file  ?Social Connections: Not on file  ?  ? ?Family History: ?The patient's family history includes Hypertension in her father. ? ?ROS:   ?Please see the history of present illness.    ?Review of Systems  ?Constitutional:  Negative for chills, diaphoresis, fever and malaise/fatigue.  ?HENT:  Negative for congestion, nosebleeds and sore throat.   ?Eyes:  Negative for blurred vision and pain.  ?Respiratory:  Negative for cough, shortness of breath and wheezing.   ?Cardiovascular:  Negative for chest pain, palpitations, orthopnea, claudication, leg swelling and PND.  ?Gastrointestinal:  Negative for blood in stool, constipation, diarrhea, nausea and vomiting.  ?Genitourinary:  Negative for dysuria, hematuria and urgency.  ?Musculoskeletal:  Negative for falls and myalgias.  ?Neurological:  Negative for dizziness, seizures, loss of consciousness and weakness.  ?Endo/Heme/Allergies:  Does not bruise/bleed easily.  ?Psychiatric/Behavioral:  Negative for depression. The patient is not nervous/anxious and does not have insomnia.   ? ? ?EKGs/Labs/Other Studies Reviewed:   ? ?The following studies were reviewed today: ?Ca score 03/2021: ?  ?FINDINGS: ?Coronary Calcium Score: ?  ?Left main: 0 ?  ?Left anterior descending artery: 222 ?  ?Left circumflex artery: 15 ?  ?Right coronary artery: 5.4 ?  ?Total: 242.4 ?  ?Percentile: 95th ?  ?Pericardium: Normal. ?  ?Ascending Aorta: Normal caliber. ?  ?Non-cardiac: See separate report from [facility]. ?  ?IMPRESSION: ?Coronary calcium score of 242.5. This was 95th percentile for age-, ?race-, and sex-matched controls. ? ?TTE 03/2021: ?IMPRESSIONS  ? ? ? 1. Prominent pulmonary vein a  wave reversal (elevated LVEDP with normal  ?mean LA pressure). Left ventricular ejection fraction, by estimation, is  ?60 to 65%. Left ventricular ejection fraction by 3D volume is 62 %. The  ?left ventricle has normal function.  ?The left ventricle has no regional wall motion abnormalities. There is  ?mild concentric left ventricular hypertrophy. Left ventricular diastolic  ?parameters are consistent with Grade I diastolic dysfunction (impaired  ?relaxation). Elevated left ventricular  ?end-diastolic pressure. The E/e' is 9.3. The average left ventricular  ?global longitudinal strain is -21.3 %. The global longitudinal strain is  ?normal.  ? 2. Right ventricular systolic function is normal. The right ventricular  ?size is normal.  ? 3. Left atrial size was moderately dilated.  ? 4. The mitral valve is normal in structure. Trivial mitral valve  ?regurgitation. No evidence of mitral stenosis.  ? 5.  The aortic valve is normal in structure. Aortic valve regurgitation is  ?not visualized. No aortic stenosis is present.  ? 6. The inferior vena cava is normal in size with greater than 50%  ?respiratory variability, suggesting right atrial pressure of 3 mmHg.  ? ?TTE 2017: ?Study Conclusions  ? ?- Left ventricle: The cavity size was normal. Wall thickness was  ?  increased in a pattern of mild LVH. Systolic function was normal.  ?  The estimated ejection fraction was in the range of 55% to 60%.  ?  Wall motion was normal; there were no regional wall motion  ?  abnormalities. Doppler parameters are consistent with abnormal  ?  left ventricular relaxation (grade 1 diastolic dysfunction).  ?- Aortic valve: There was no stenosis.  ?- Aorta: Ascending aortic diameter: 38 mm (S).  ?- Ascending aorta: The ascending aorta was mildly dilated.  ?- Mitral valve: There was no significant regurgitation.  ?- Right ventricle: The cavity size was normal. Systolic function  ?  was normal.  ?- Pulmonary arteries: No complete TR doppler jet  so unable to  ?  estimate PA systolic pressure.  ? ?Impressions:  ? ?- Normal LV size with mild LV hypertrophy. EF 55-60%. Normal RV  ?  size and systolic function. No significant valvular  ?  abnorma

## 2021-11-27 ENCOUNTER — Encounter: Payer: Self-pay | Admitting: Pharmacist

## 2021-11-27 ENCOUNTER — Ambulatory Visit: Payer: BC Managed Care – PPO | Admitting: Cardiology

## 2021-11-27 ENCOUNTER — Encounter: Payer: Self-pay | Admitting: Cardiology

## 2021-11-27 ENCOUNTER — Telehealth: Payer: Self-pay | Admitting: Pharmacist

## 2021-11-27 VITALS — BP 152/100 | HR 91 | Ht 68.5 in | Wt 266.0 lb

## 2021-11-27 DIAGNOSIS — E119 Type 2 diabetes mellitus without complications: Secondary | ICD-10-CM

## 2021-11-27 DIAGNOSIS — E7800 Pure hypercholesterolemia, unspecified: Secondary | ICD-10-CM | POA: Diagnosis not present

## 2021-11-27 DIAGNOSIS — Z79899 Other long term (current) drug therapy: Secondary | ICD-10-CM

## 2021-11-27 DIAGNOSIS — I10 Essential (primary) hypertension: Secondary | ICD-10-CM | POA: Diagnosis not present

## 2021-11-27 DIAGNOSIS — I2510 Atherosclerotic heart disease of native coronary artery without angina pectoris: Secondary | ICD-10-CM | POA: Diagnosis not present

## 2021-11-27 DIAGNOSIS — I2584 Coronary atherosclerosis due to calcified coronary lesion: Secondary | ICD-10-CM

## 2021-11-27 DIAGNOSIS — Z6841 Body Mass Index (BMI) 40.0 and over, adult: Secondary | ICD-10-CM

## 2021-11-27 MED ORDER — MOUNJARO 2.5 MG/0.5ML ~~LOC~~ SOAJ: 2.5000 mg | SUBCUTANEOUS | 0 refills | Status: DC

## 2021-11-27 MED ORDER — VALSARTAN 160 MG PO TABS: 160.0000 mg | ORAL_TABLET | Freq: Every day | ORAL | 3 refills | Status: DC

## 2021-11-27 NOTE — Telephone Encounter (Signed)
Spoke with pt who is interested in starting [place]. Rx sent to her pharmacy for the starting dose. Sent her $25/month copay card information via MyChart. Scheduled PharmD visit on 4/13 to initiate therapy ?

## 2021-11-27 NOTE — Patient Instructions (Signed)
Medication Instructions:  ? ?INCREASE YOUR VALSARTAN TO 160 MG BY MOUTH DAILY ? ?*If you need a refill on your cardiac medications before your next appointment, please call your pharmacy* ? ? ?Lab Work: ? ?IN ONE WEEK HERE IN THE OFFICE--CHECK BMET ? ?If you have labs (blood work) drawn today and your tests are completely normal, you will receive your results only by: ?MyChart Message (if you have MyChart) OR ?A paper copy in the mail ?If you have any lab test that is abnormal or we need to change your treatment, we will call you to review the results. ? ? ?Follow-Up: ?At [facility], you and your health needs are our priority.  As part of our continuing mission to provide you with exceptional heart care, we have created designated Provider Care Teams.  These Care Teams include your primary Cardiologist (physician) and Advanced Practice Providers (APPs -  Physician Assistants and Nurse Practitioners) who all work together to provide you with the care you need, when you need it. ? ?We recommend signing up for the patient portal called "MyChart".  Sign up information is provided on this After Visit Summary.  MyChart is used to connect with patients for Virtual Visits (Telemedicine).  Patients are able to view lab/test results, encounter notes, upcoming appointments, etc.  Non-urgent messages can be sent to your provider as well.   ?To learn more about what you can do with MyChart, go to [email or web address].   ? ?Your next appointment:   ?6 month(s) ? ?The format for your next appointment:   ?In Person ? ?Provider:   ?Richardson Dopp, PA-C     { ? ? ? ?

## 2021-11-27 NOTE — Telephone Encounter (Signed)
Pt referred by Dr Johney Frame to initiate [id number] therapy for weight loss. Has DM, most recent A1c well controlled at 6.5%. Pt currently taking metformin 1g BID, Farxiga '10mg'$  daily, and Lantus 12u daily. ? ?Miranda Marquez is on her formulary. Will plan to start [facility] with goal of eventually discontinuing her insulin.  ?

## 2021-11-27 NOTE — Progress Notes (Signed)
?Cardiology Office Note:   ? ?Date:  11/27/2021  ? ?ID:  Miranda Marquez, DOB Apr 27, 1961, MRN [id number] ? ?PCP:  Vania Rea, MD ?  ?[facility] Providers ?Cardiologist:  Freada Bergeron, MD { ? ?Referring MD: Vania Rea, MD  ? ? ?History of Present Illness:   ? ?Miranda Marquez is a 61 y.o. 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 Meda Coffee in 2017 who now presents to clinic for follow-up. ? ?Saw Dr. Meda Coffee in 2017 where she was experiencing dyspnea on exertion. TTE with LVEF 55-60%, G1DD, mild ascending aortic aneurysm 94m. Myoview negative for ischemia. She was lost to follow-up. ? ?Returned to clinic in 12/2020 after being diagnosed with HLD and DMII. Specifically, her LDL cholesterol is  170, and her hemoglobin A1C is severely high at 12.4. Ca score 03/2021 242 which was 95% for age, gender, race matched controls. TTE 03/2021 with LVEF 60-65%, G1DD, GLS -21.3, moderate LAE, normal RV, trivial MR. We started her on crestor and metformin at that time.  ? ?Last saw SRichardson Dopp PA-C on 03/2021 where she was having elevated blood pressures. She was started on valsartan for BP control. ? ?Today, she reports she is doing okay. States blood pressure continues to be elevated and she can have some dizziness with this. Has been compliant with medications and DMII is much better controlled.  ?BP Readings from Last 3 Encounters:  ?11/27/21 (!) 152/100  ?09/09/21 (!) 146/80  ?04/23/21 (!) 142/96  ?  ?She reports she has gained about 8 pounds. She is also very stressed at this time. She has been trying to exercise but has hip pain which limits her mobility. She is planned for hip replacement later this year.  ? ?She denies chest pain, shortness of breath, palpitations, headaches, syncope, LE edema, orthopnea, PND.  ? ?Past Medical History:  ?Diagnosis Date  ? Arthritis   ? Cervical herniated disc   ? Complication of anesthesia   ? hypotension  in  april after hysterectomy  ? Gestational diabetes   ? history..back 21 plus yrs ago.  Now she is ok  ? Hypertension   ? not taking meds   ? Pituitary microadenoma with hyperprolactinemia ([place]   ? Psoriasis both elbows, scalp, left thigh, saw dr lAllyson Sabal last week given cream for areas healing  ? Sleep apnea   ? never had study done, but things she does have it  ? ? ?Past Surgical History:  ?Procedure Laterality Date  ? ABDOMINAL HYSTERECTOMY    ? APPENDECTOMY    ? BREAST BIOPSY Right 10/11/2014  ? BREAST EXCISIONAL BIOPSY Right 07/2015  ? BREAST EXCISIONAL BIOPSY Right   ? BREAST LUMPECTOMY WITH RADIOACTIVE SEED LOCALIZATION Right 08/07/2015  ? Procedure: RIGHT BREAST LUMPECTOMY WITH RADIOACTIVE SEED LOCALIZATION AND EXCISION OF BREAST MASS X2;  Surgeon: DCoralie Keens MD;  [place] [facility] [place]  Service: General;  Laterality: Right;  ? BREAST SURGERY    ? DILATION AND CURETTAGE OF UTERUS    ? FOOT SURGERY    ? left heel    ? HERNIA REPAIR    ? umbilical  ? MAXIMUM ACCESS (MAS)POSTERIOR LUMBAR INTERBODY FUSION (PLIF) 1 LEVEL N/A 09/09/2016  ? Procedure: MAXIMUM ACCESS SURGERY POSTERIOR LUMBAR INTERBODY FUSION LUMBAR THREE-FOUR;  Surgeon: DEustace Moore MD;  [place] [facility] [place]  Service: Neurosurgery;  Laterality: N/A;  ? MINI-LAPAROTOMY W/ TUBAL LIGATION    ? ROBOTIC ASSISTED TOTAL HYSTERECTOMY  WITH BILATERAL SALPINGO OOPHERECTOMY Bilateral 12/03/2014  ? Procedure: ROBOTIC ASSISTED TOTAL HYSTERECTOMY WITH BILATERAL SALPINGO OOPHORECTOMY;  Surgeon: Everitt Amber, MD;  [facility];  Service: Gynecology;  Laterality: Bilateral;  ? TOTAL HIP ARTHROPLASTY Right 10/18/2017  ? Procedure: RIGHT TOTAL HIP ARTHROPLASTY ANTERIOR APPROACH;  Surgeon: Paralee Cancel, MD;  [facility];  Service: Orthopedics;  Laterality: Right;  70 mins  ? TUBAL LIGATION    ? ? ?Current Medications: ?Current Meds  ?Medication Sig  ? dapagliflozin propanediol (FARXIGA) 10 MG TABS tablet Take 1 tablet (10 mg total) by mouth daily before breakfast.  ?  glucose blood (ONETOUCH VERIO) test strip Use as instructed  ? Ibuprofen-Famotidine 800-26.6 MG TABS Take by mouth.  ? insulin glargine (LANTUS SOLOSTAR) 100 UNIT/ML Solostar Pen Inject 12 Units into the skin daily.  ? Insulin Pen Needle 32G X 4 MM MISC 1 Device by Does not apply route daily.  ? metFORMIN (GLUCOPHAGE-XR) 500 MG 24 hr tablet Take 2 tablets (1,000 mg total) by mouth in the morning and at bedtime.  ? rosuvastatin (CRESTOR) 20 MG tablet Take 1 tablet (20 mg total) by mouth daily.  ? valsartan (DIOVAN) 160 MG tablet Take 1 tablet (160 mg total) by mouth daily.  ? [DISCONTINUED] valsartan (DIOVAN) 80 MG tablet Take 1 tablet (80 mg total) by mouth daily.  ?  ? ?Allergies:   Patient has no known allergies.  ? ?Social History  ? ?Socioeconomic History  ? Marital status: Married  ?  Spouse name: Not on file  ? Number of children: Not on file  ? Years of education: Not on file  ? Highest education level: Not on file  ?Occupational History  ? Not on file  ?Tobacco Use  ? Smoking status: Former  ?  Types: Cigarettes  ?  Quit date: 08/23/1978  ?  Years since quitting: 43.2  ? Smokeless tobacco: Never  ?Vaping Use  ? Vaping Use: Never used  ?Substance and Sexual Activity  ? Alcohol use: Yes  ?  Alcohol/week: 2.0 standard drinks  ?  Types: 2 Glasses of wine per week  ?  Comment: occasional, weekends  ? Drug use: No  ? Sexual activity: Yes  ?Other Topics Concern  ? Not on file  ?Social History Narrative  ? Not on file  ? ?Social Determinants of Health  ? ?Financial Resource Strain: Not on file  ?Food Insecurity: Not on file  ?Transportation Needs: Not on file  ?Physical Activity: Not on file  ?Stress: Not on file  ?Social Connections: Not on file  ?  ? ?Family History: ?The patient's family history includes Hypertension in her father. ? ?ROS:   ?Please see the history of present illness.    ?Review of Systems  ?Constitutional:  Negative for chills, diaphoresis, fever and malaise/fatigue.  ?HENT:  Negative for  congestion, nosebleeds and sore throat.   ?Eyes:  Negative for blurred vision and pain.  ?Respiratory:  Negative for cough, shortness of breath and wheezing.   ?Cardiovascular:  Negative for chest pain, palpitations, orthopnea, claudication, leg swelling and PND.  ?Gastrointestinal:  Negative for blood in stool, constipation, diarrhea, nausea and vomiting.  ?Genitourinary:  Negative for dysuria, hematuria and urgency.  ?Musculoskeletal:  Negative for falls and myalgias.  ?Neurological:  Positive for dizziness. Negative for seizures, loss of consciousness and weakness.  ?Endo/Heme/Allergies:  Does not bruise/bleed easily.  ?Psychiatric/Behavioral:  Negative for depression. The patient is not nervous/anxious and does not have insomnia.   ? ? ?EKGs/Labs/Other  Studies Reviewed:   ? ?The following studies were reviewed today: ?Ca score 03/2021: ?  ?FINDINGS: ?Coronary Calcium Score: ?  ?Left main: 0 ?  ?Left anterior descending artery: 222 ?  ?Left circumflex artery: 15 ?  ?Right coronary artery: 5.4 ?  ?Total: 242.4 ?  ?Percentile: 95th ?  ?Pericardium: Normal. ?  ?Ascending Aorta: Normal caliber. ?  ?Non-cardiac: See separate report from [facility]. ?  ?IMPRESSION: ?Coronary calcium score of 242.5. This was 95th percentile for age-, ?race-, and sex-matched controls. ? ?TTE 03/2021: ?IMPRESSIONS  ? ? ? 1. Prominent pulmonary vein a wave reversal (elevated LVEDP with normal  ?mean LA pressure). Left ventricular ejection fraction, by estimation, is  ?60 to 65%. Left ventricular ejection fraction by 3D volume is 62 %. The  ?left ventricle has normal function.  ?The left ventricle has no regional wall motion abnormalities. There is  ?mild concentric left ventricular hypertrophy. Left ventricular diastolic  ?parameters are consistent with Grade I diastolic dysfunction (impaired  ?relaxation). Elevated left ventricular  ?end-diastolic pressure. The E/e' is 9.3. The average left ventricular  ?global longitudinal  strain is -21.3 %. The global longitudinal strain is  ?normal.  ? 2. Right ventricular systolic function is normal. The right ventricular  ?size is normal.  ? 3. Left atrial size was moderately dilated.  ? 4. The m

## 2021-12-02 NOTE — Progress Notes (Unsigned)
Patient ID: Miranda Marquez                 DOB: 11/03/1960                    MRN: [id number]     HPI: Miranda Marquez is a 61 y.o. female patient referred to pharmacy clinic by Dr. Pemberton to initiate weight loss therapy with GLP1-RA. PMH is significant for obesity, pituitary microadenoma resulting in 5 miscarriages, HLD, HTN and gestational DMII. Saw Dr. Nelson in 2017 where she was experiencing dyspnea on exertion. Myoview negative for ischemia. She was lost to follow-up. Returned to clinic in 12/2020 after being diagnosed with HLD and DMII. Specifically, her LDL cholesterol was 170, and her hemoglobin A1C was severely high at 12.4. Ca score 03/2021 242 which was 95% for age, gender, race matched controls. She was started on crestor and metformin at that time. Last saw Scott Weaver, PA-C on 03/2021 where she was having elevated blood pressures. She was started on valsartan for BP control. Most recent BMI is 39.86.  Pt saw Dr. Pemberton on 11/27/21  *** If diabetic and on insulin/sulfonylurea, can consider reducing dose to reduce risk of hypoglycemia  Current weight management medications: None  Previously tried meds: None  Current meds that may affect weight: insulin glargine (increase), dapagliflozin (decrease)  Baseline weight/BMI: 266 / 39.86  Insurance payor:   Diet:  -Breakfast: -Lunch: -Dinner: -Snacks: -Drinks:  Exercise:   Family History: The patient's family history includes Hypertension in her father  Social History: Former cigarette smoker (quit 43 years ago). Reports occasional alcohol use - 2 glasses of wine on weekends. Denies drug use.  Labs: Lab Results  Component Value Date   HGBA1C 6.5 (A) 09/09/2021    Wt Readings from Last 1 Encounters:  11/27/21 266 lb (120.7 kg)    BP Readings from Last 1 Encounters:  11/27/21 (!) 152/100   Pulse Readings from Last 1 Encounters:  11/27/21 91       Component Value Date/Time   CHOL 147 04/03/2021 1330   TRIG 106  04/03/2021 1330   HDL 64 04/03/2021 1330   CHOLHDL 2.3 04/03/2021 1330   LDLCALC 64 04/03/2021 1330    Past Medical History:  Diagnosis Date   Arthritis    Cervical herniated disc    Complication of anesthesia    hypotension  in april after hysterectomy   Gestational diabetes    history..back 21 plus yrs ago.  Now she is ok   Hypertension    not taking meds    Pituitary microadenoma with hyperprolactinemia (HCC)    Psoriasis both elbows, scalp, left thigh, saw dr lupton, last week given cream for areas healing   Sleep apnea    never had study done, but things she does have it    Current Outpatient Medications on File Prior to Visit  Medication Sig Dispense Refill   dapagliflozin propanediol (FARXIGA) 10 MG TABS tablet Take 1 tablet (10 mg total) by mouth daily before breakfast. 90 tablet 3   glucose blood (ONETOUCH VERIO) test strip Use as instructed 100 each 12   Ibuprofen-Famotidine 800-26.6 MG TABS Take by mouth.     insulin glargine (LANTUS SOLOSTAR) 100 UNIT/ML Solostar Pen Inject 12 Units into the skin daily. 15 mL 11   Insulin Pen Needle 32G X 4 MM MISC 1 Device by Does not apply route daily. 50 each 6   metFORMIN (GLUCOPHAGE-XR) 500 MG 24 hr tablet Take   2 tablets (1,000 mg total) by mouth in the morning and at bedtime. 360 tablet 3   rosuvastatin (CRESTOR) 20 MG tablet Take 1 tablet (20 mg total) by mouth daily. 90 tablet 2   tirzepatide (MOUNJARO) 2.5 MG/0.5ML Pen Inject 2.5 mg into the skin once a week. 2 mL 0   valsartan (DIOVAN) 160 MG tablet Take 1 tablet (160 mg total) by mouth daily. 90 tablet 3   No current facility-administered medications on file prior to visit.   No Known Allergies  Assessment/Plan:  1. Weight loss - Patient has not met goal of at least 5% of body weight loss with comprehensive lifestyle modifications alone in the past 3-6 months. Pharmacotherapy is appropriate to pursue as augmentation. Will start Mounjaro 2.5 mg subcutaneously once weekly.  Confirmed patient has no personal or family history of medullary thyroid carcinoma (MTC) or Multiple Endocrine Neoplasia syndrome type 2 (MEN 2).   Advised patient on common side effects including nausea, diarrhea, dyspepsia, decreased appetite, and fatigue. Counseled patient on reducing meal size and how to titrate medication to minimize side effects. Counseled patient to call if intolerable side effects or if experiencing dehydration, abdominal pain, or dizziness. Patient will adhere to dietary modifications and will target at least 150 minutes of moderate intensity exercise weekly.   Injection technique reviewed at today's visit and patient successfully self-administered first dose of *** into the fatty tissue of the abdomen.  Titration Plan:  Will plan to follow the titration plan as below, pending patient is tolerating each dose before increasing to the next. Can slow titration if needed for tolerability.    -Month 1: Inject 2.5 mg SQ once weekly x4 weeks -Month 2: Inject 5 mg SQ once weekly x4 weeks -Month 3: Inject 7.5 mg SQ once weekly x4 weeks -Month 4: Inject 10 mg SQ once weekly x4 weeks -Month 5: Inject 12.5 mg SQ once weekly x4 weeks -Month 6+: Inject 15 mg SQ once weekly  Follow up in ***. 

## 2021-12-03 ENCOUNTER — Other Ambulatory Visit: Payer: BC Managed Care – PPO

## 2021-12-03 ENCOUNTER — Ambulatory Visit: Payer: BC Managed Care – PPO

## 2021-12-10 ENCOUNTER — Ambulatory Visit: Payer: BC Managed Care – PPO | Admitting: Cardiology

## 2021-12-25 ENCOUNTER — Ambulatory Visit: Payer: BC Managed Care – PPO

## 2021-12-25 ENCOUNTER — Other Ambulatory Visit: Payer: BC Managed Care – PPO

## 2021-12-28 ENCOUNTER — Other Ambulatory Visit: Payer: Self-pay | Admitting: Cardiology

## 2021-12-28 DIAGNOSIS — R7309 Other abnormal glucose: Secondary | ICD-10-CM

## 2021-12-28 DIAGNOSIS — R011 Cardiac murmur, unspecified: Secondary | ICD-10-CM

## 2021-12-28 DIAGNOSIS — I10 Essential (primary) hypertension: Secondary | ICD-10-CM

## 2021-12-28 DIAGNOSIS — E785 Hyperlipidemia, unspecified: Secondary | ICD-10-CM

## 2022-01-20 ENCOUNTER — Ambulatory Visit: Payer: BC Managed Care – PPO

## 2022-01-20 ENCOUNTER — Other Ambulatory Visit: Payer: BC Managed Care – PPO

## 2022-02-15 NOTE — Progress Notes (Deleted)
Patient ID: Miranda Marquez                 DOB: 02-20-1961                    MRN: [id number]     HPI: Miranda Marquez is a 61 y.o. female patient referred to pharmacy clinic by Dr. Johney Frame to initiate weight loss therapy with GLP1-RA. PMH is significant for obesity, pituitary microadenoma resulting in 5 miscarriages, HLD, HTN and gestational DMII. Saw Dr. Meda Coffee in 2017 where she was experiencing dyspnea on exertion. Myoview negative for ischemia. She was lost to follow-up. Returned to clinic in 12/2020 after being diagnosed with HLD and DMII. Specifically, her LDL cholesterol was 170, and her hemoglobin A1C was severely high at 12.4. Ca score 03/2021 242 which was 95% for age, gender, race matched controls. She was started on crestor and metformin at that time. Last saw Richardson Dopp, PA-C on 03/2021 where she was having elevated blood pressures. She was started on valsartan for BP control. Most recent BMI is 39.86.  Pt saw Dr. Johney Frame on 11/27/21  *** If diabetic and on insulin/sulfonylurea, can consider reducing dose to reduce risk of hypoglycemia  Current weight management medications: None  Previously tried meds: None  Current meds that may affect weight: insulin glargine (increase), dapagliflozin (decrease)  Baseline weight/BMI: 266 / 39.86  Insurance payor:   Diet:  -Breakfast: -Lunch: -Dinner: -Snacks: -Drinks:  Exercise:   Family History: The patient's family history includes Hypertension in her father  Social History: Former cigarette smoker (quit 43 years ago). Reports occasional alcohol use - 2 glasses of wine on weekends. Denies drug use.  Labs: Lab Results  Component Value Date   HGBA1C 6.5 (A) 09/09/2021    Wt Readings from Last 1 Encounters:  11/27/21 266 lb (120.7 kg)    BP Readings from Last 1 Encounters:  11/27/21 (!) 152/100   Pulse Readings from Last 1 Encounters:  11/27/21 91       Component Value Date/Time   CHOL 147 04/03/2021 1330   TRIG 106  04/03/2021 1330   HDL 64 04/03/2021 1330   CHOLHDL 2.3 04/03/2021 1330   LDLCALC 64 04/03/2021 1330    Past Medical History:  Diagnosis Date   Arthritis    Cervical herniated disc    Complication of anesthesia    hypotension  in april after hysterectomy   Gestational diabetes    history..back 21 plus yrs ago.  Now she is ok   Hypertension    not taking meds    Pituitary microadenoma with hyperprolactinemia (HCC)    Psoriasis both elbows, scalp, left thigh, saw dr Allyson Sabal, last week given cream for areas healing   Sleep apnea    never had study done, but things she does have it    Current Outpatient Medications on File Prior to Visit  Medication Sig Dispense Refill   dapagliflozin propanediol (FARXIGA) 10 MG TABS tablet Take 1 tablet (10 mg total) by mouth daily before breakfast. 90 tablet 3   glucose blood (ONETOUCH VERIO) test strip Use as instructed 100 each 12   Ibuprofen-Famotidine 800-26.6 MG TABS Take by mouth.     insulin glargine (LANTUS SOLOSTAR) 100 UNIT/ML Solostar Pen Inject 12 Units into the skin daily. 15 mL 11   Insulin Pen Needle 32G X 4 MM MISC 1 Device by Does not apply route daily. 50 each 6   metFORMIN (GLUCOPHAGE-XR) 500 MG 24 hr tablet Take  2 tablets (1,000 mg total) by mouth in the morning and at bedtime. 360 tablet 3   rosuvastatin (CRESTOR) 20 MG tablet Take 1 tablet (20 mg total) by mouth daily. 90 tablet 2   tirzepatide (MOUNJARO) 2.5 MG/0.5ML Pen Inject 2.5 mg into the skin once a week. 2 mL 0   valsartan (DIOVAN) 160 MG tablet Take 1 tablet (160 mg total) by mouth daily. 90 tablet 3   No current facility-administered medications on file prior to visit.   No Known Allergies  Assessment/Plan:  1. Weight loss - Patient has not met goal of at least 5% of body weight loss with comprehensive lifestyle modifications alone in the past 3-6 months. Pharmacotherapy is appropriate to pursue as augmentation. Will start Mounjaro 2.5 mg subcutaneously once weekly.  Confirmed patient has no personal or family history of medullary thyroid carcinoma (MTC) or Multiple Endocrine Neoplasia syndrome type 2 (MEN 2).   Advised patient on common side effects including nausea, diarrhea, dyspepsia, decreased appetite, and fatigue. Counseled patient on reducing meal size and how to titrate medication to minimize side effects. Counseled patient to call if intolerable side effects or if experiencing dehydration, abdominal pain, or dizziness. Patient will adhere to dietary modifications and will target at least 150 minutes of moderate intensity exercise weekly.   Injection technique reviewed at today's visit and patient successfully self-administered first dose of *** into the fatty tissue of the abdomen.  Titration Plan:  Will plan to follow the titration plan as below, pending patient is tolerating each dose before increasing to the next. Can slow titration if needed for tolerability.    -Month 1: Inject 2.5 mg SQ once weekly x4 weeks -Month 2: Inject 5 mg SQ once weekly x4 weeks -Month 3: Inject 7.5 mg SQ once weekly x4 weeks -Month 4: Inject 10 mg SQ once weekly x4 weeks -Month 5: Inject 12.5 mg SQ once weekly x4 weeks -Month 6+: Inject 15 mg SQ once weekly  Follow up in ***.

## 2022-02-16 ENCOUNTER — Ambulatory Visit: Payer: BC Managed Care – PPO

## 2022-02-16 ENCOUNTER — Other Ambulatory Visit: Payer: BC Managed Care – PPO

## 2022-02-26 ENCOUNTER — Other Ambulatory Visit: Payer: Self-pay | Admitting: Internal Medicine

## 2022-02-26 DIAGNOSIS — E1165 Type 2 diabetes mellitus with hyperglycemia: Secondary | ICD-10-CM

## 2022-03-04 ENCOUNTER — Ambulatory Visit: Payer: BC Managed Care – PPO | Admitting: Internal Medicine

## 2022-03-09 ENCOUNTER — Ambulatory Visit: Payer: BC Managed Care – PPO | Admitting: Internal Medicine

## 2022-03-09 ENCOUNTER — Encounter: Payer: Self-pay | Admitting: Internal Medicine

## 2022-03-09 VITALS — BP 132/86 | HR 92 | Ht 68.5 in | Wt 275.0 lb

## 2022-03-09 DIAGNOSIS — E785 Hyperlipidemia, unspecified: Secondary | ICD-10-CM | POA: Diagnosis not present

## 2022-03-09 DIAGNOSIS — D352 Benign neoplasm of pituitary gland: Secondary | ICD-10-CM

## 2022-03-09 DIAGNOSIS — E1165 Type 2 diabetes mellitus with hyperglycemia: Secondary | ICD-10-CM

## 2022-03-09 LAB — POCT GLUCOSE (DEVICE FOR HOME USE): Glucose Fasting, POC: 152 mg/dL — AB (ref 70–99)

## 2022-03-09 LAB — PROLACTIN: Prolactin: 16.8 ng/mL

## 2022-03-09 LAB — POCT GLYCOSYLATED HEMOGLOBIN (HGB A1C): Hemoglobin A1C: 6.6 % — AB (ref 4.0–5.6)

## 2022-03-09 NOTE — Patient Instructions (Signed)
-   Stop Lantus ( Insulin ) 10 units when you start Mounjaro  - Continue Metformin 500 mg TWO tablets Twice daily  - Continue  Farxiga 10 mg, 1 tablet daily in the morning     HOW TO TREAT LOW BLOOD SUGARS (Blood sugar LESS THAN 70 MG/DL) Please follow the RULE OF 15 for the treatment of hypoglycemia treatment (when your (blood sugars are less than 70 mg/dL)   STEP 1: Take 15 grams of carbohydrates when your blood sugar is low, which includes:  3-4 GLUCOSE TABS  OR 3-4 OZ OF JUICE OR REGULAR SODA OR ONE TUBE OF GLUCOSE GEL    STEP 2: RECHECK blood sugar in 15 MINUTES STEP 3: If your blood sugar is still low at the 15 minute recheck --> then, go back to STEP 1 and treat AGAIN with another 15 grams of carbohydrates.

## 2022-03-09 NOTE — Progress Notes (Unsigned)
Name: Miranda Marquez  Age/ Sex: 61 y.o., female   MRN/ DOB: [id number], February 16, 1961     PCP: Vania Rea, MD   Reason for Endocrinology Evaluation: Type 2 Diabetes Mellitus  Initial Endocrine Consultative Visit: 01/16/2021    PATIENT IDENTIFIER: Miranda Marquez is a 61 y.o. female with a past medical history of HTN, T2DM, HTN and dyslipidemia. The patient has followed with Endocrinology clinic since 01/16/2021 for consultative assistance with management of her diabetes.  DIABETIC HISTORY:  Ms. Paschal was diagnosed with DM 07/2020.  She had gestations diabetes > 25 yrs ago. Her hemoglobin A1c has ranged from 10.2% in 2022, peaking at 12.4% in 2021  On her initial visit to our clinic she had an A1c of 10.7% she was not on any medications, we started metformin and insulin   By 04/2021 Wilder Glade started   PITUITARY HISTORY: She has a diagnosis of pituitary microadenoma in 2006. With an MRI showing a 5 mm adenoma . She has a Hx of hyperprolactinemia . She was on Bromocriptine until 2021 . Repeat Prolactin was normal x 2 in 2022 and we opted to hold off on Bromocriptine.      SUBJECTIVE:   During the last visit (09/09/2021): A1c 6.5% Continued  metformin, decreased  insulin and started [place]      Today (03/09/2022): Miranda Marquez  is here for a follow up on diabetes management. She checks her blood sugars 1 times daily. The patient has not had hypoglycemic episodes since the last clinic visit.   She has been on vacation with dietary indiscretions  Denies nausea or diarrhea  She has been noted with weight gain  Denies pancreatitis or heartburn  She was started on Mounjaro by cardiology  She endorses elevated BP at home , she has been noted with LE edema as well    HOME DIABETES REGIMEN:  Metformin 500 mg XR 2 tabs BID  Lantus 12 units daily - 10 units  Farxiga 10 mg daily     Statin: yes ACE-I/ARB: no Prior Diabetic Education: no   GLUCOSE LOG:  114- 193 mg/dL in the past month     DIABETIC COMPLICATIONS: Microvascular complications:   Denies: CKD, neuropathy, retinopathy Last Eye Exam: Completed yrs ago    Macrovascular complications:   Denies: CAD, CVA, PVD   HISTORY:  Past Medical History:  Past Medical History:  Diagnosis Date   Arthritis    Cervical herniated disc    Complication of anesthesia    hypotension  in april after hysterectomy   Gestational diabetes    history..back 21 plus yrs ago.  Now she is ok   Hypertension    not taking meds    Pituitary microadenoma with hyperprolactinemia (HCC)    Psoriasis both elbows, scalp, left thigh, saw dr Allyson Sabal, last week given cream for areas healing   Sleep apnea    never had study done, but things she does have it   Past Surgical History:  Past Surgical History:  Procedure Laterality Date   ABDOMINAL HYSTERECTOMY     APPENDECTOMY     BREAST BIOPSY Right 10/11/2014   BREAST EXCISIONAL BIOPSY Right 07/2015   BREAST EXCISIONAL BIOPSY Right    BREAST LUMPECTOMY WITH RADIOACTIVE SEED LOCALIZATION Right 08/07/2015   Procedure: RIGHT BREAST LUMPECTOMY WITH RADIOACTIVE SEED LOCALIZATION AND EXCISION OF BREAST MASS X2;  Surgeon: Coralie Keens, MD;  Location: [facility];  Service: General;  Laterality: Right;   BREAST SURGERY     DILATION AND  CURETTAGE OF UTERUS     FOOT SURGERY     left heel     HERNIA REPAIR     umbilical   MAXIMUM ACCESS (MAS)POSTERIOR LUMBAR INTERBODY FUSION (PLIF) 1 LEVEL N/A 09/09/2016   Procedure: MAXIMUM ACCESS SURGERY POSTERIOR LUMBAR INTERBODY FUSION LUMBAR THREE-FOUR;  Surgeon: Eustace Moore, MD;  Location: [place];  Service: Neurosurgery;  Laterality: N/A;   MINI-LAPAROTOMY W/ TUBAL LIGATION     ROBOTIC ASSISTED TOTAL HYSTERECTOMY WITH BILATERAL SALPINGO OOPHERECTOMY Bilateral 12/03/2014   Procedure: ROBOTIC ASSISTED TOTAL HYSTERECTOMY WITH BILATERAL SALPINGO OOPHORECTOMY;  Surgeon: Everitt Amber, MD;  Location: [facility];  Service: Gynecology;  Laterality: Bilateral;   TOTAL HIP  ARTHROPLASTY Right 10/18/2017   Procedure: RIGHT TOTAL HIP ARTHROPLASTY ANTERIOR APPROACH;  Surgeon: Paralee Cancel, MD;  Location: [facility];  Service: Orthopedics;  Laterality: Right;  70 mins   TUBAL LIGATION     Social History:  reports that she quit smoking about 62 years ago. Her smoking use included cigarettes. She has never used smokeless tobacco. She reports current alcohol use of about 2.0 standard drinks of alcohol per week. She reports that she does not use drugs. Family History:  Family History  Problem Relation Age of Onset   Hypertension Father      HOME MEDICATIONS: Allergies as of 03/09/2022   No Known Allergies      Medication List        Accurate as of March 09, 2022 12:43 PM. If you have any questions, ask your nurse or doctor.          BD Pen Needle Nano U/F 32G X 4 MM Misc Generic drug: Insulin Pen Needle USE AS DIRECTED DAILY   dapagliflozin propanediol 10 MG Tabs tablet Commonly known as: Farxiga Take 1 tablet (10 mg total) by mouth daily before breakfast.   Ibuprofen-Famotidine 800-26.6 MG Tabs Take by mouth.   Lantus SoloStar 100 UNIT/ML Solostar Pen Generic drug: insulin glargine Inject 12 Units into the skin daily.   metFORMIN 500 MG 24 hr tablet Commonly known as: GLUCOPHAGE-XR Take 2 tablets (1,000 mg total) by mouth in the morning and at bedtime.   Mounjaro 2.5 MG/0.5ML Pen Generic drug: tirzepatide Inject 2.5 mg into the skin once a week.   OneTouch Verio test strip Generic drug: glucose blood Use as instructed   rosuvastatin 20 MG tablet Commonly known as: CRESTOR Take 1 tablet (20 mg total) by mouth daily.   valsartan 160 MG tablet Commonly known as: Diovan Take 1 tablet (160 mg total) by mouth daily.         OBJECTIVE:   Vital Signs: BP 132/86 (BP Location: Left Arm, Patient Position: Sitting, Cuff Size: Large)   Pulse 92   Ht 5' 8.5" (1.74 m)   Wt 275 lb (124.7 kg)   SpO2 98%   BMI 41.21 kg/m   Wt Readings from  Last 3 Encounters:  03/09/22 275 lb (124.7 kg)  11/27/21 266 lb (120.7 kg)  09/09/21 264 lb (119.7 kg)     Exam: General: Pt appears well and is in NAD  Lungs: Clear with good BS bilat with no rales, rhonchi, or wheezes  Heart: RRR with normal S1 and S2 and no gallops; no murmurs; no rub  Extremities: Trace  pretibial edema.   Neuro: MS is good with appropriate affect, pt is alert and Ox3    DM foot exam: 03/09/2022     The skin of the feet is intact without sores or ulcerations.  The pedal pulses are 2+ on right and 2+ on left. The sensation is intact to a screening 5.07, 10 gram monofilament bilaterally      DATA REVIEWED:  Lab Results  Component Value Date   HGBA1C 6.6 (A) 03/09/2022   HGBA1C 6.5 (A) 09/09/2021   HGBA1C 6.6 (A) 04/23/2021       Latest Reference Range & Units 03/09/22 12:10  Prolactin ng/mL 16.8    Latest Reference Range & Units 09/09/21 09:41  Sodium 135 - 145 mEq/L 138  Potassium 3.5 - 5.1 mEq/L 4.1  Chloride 96 - 112 mEq/L 104  CO2 19 - 32 mEq/L 26  Glucose 70 - 99 mg/dL 139 (H)  BUN 6 - 23 mg/dL 14  Creatinine 0.40 - 1.20 mg/dL 0.65  Calcium 8.4 - 10.5 mg/dL 9.4  GFR >60.00 mL/min 94.62    Latest Reference Range & Units 09/09/21 09:41  MICROALB/CREAT RATIO 0.0 - 30.0 mg/g 1.2    Latest Reference Range & Units 09/09/21 09:41  Prolactin ng/mL 19.0      BRAIN MRI 03/23/2005   1. Suspect 5 mm prolactinoma in the right inferior aspect of the gland with mild remodeling of the sellar floor as well as mild deviation of the pituitary stalk from right to left.  2. Otherwise negative cranial MRI  ASSESSMENT / PLAN / RECOMMENDATIONS:   1) Type 2 Diabetes Mellitus, optimally controlled, With out complications - Most recent A1c of 6.6 %. Goal A1c < 7.0 %.    - A1c remains stable   -She had declined GLP-1 agonist in the past due to risk of medullary cancer, she has no hx of medullary cancer but she was started on Mounjaro by cardiology in 11/2021  but has not started yet . She is going to see the pharmacists and will start  - Continue Farxiga and Metformin  - I have asked her to STOP Lantus once she starts on Mounjaro   MEDICATIONS: Continue metformin 500 mg 2 tablets BID  Continue  Lantus 10 units daily Continue  Farxiga 10 mg, 1 tablet daily in the morning  EDUCATION / INSTRUCTIONS: BG monitoring instructions: Patient is instructed to check her blood sugars 1 times a day, Fasting Call [facility] if: BG persistently < 70  I reviewed the Rule of 15 for the treatment of hypoglycemia in detail with the patient. Literature supplied.   2) Diabetic complications:  Eye: Does not have known diabetic retinopathy.  Neuro/ Feet: Does not have known diabetic peripheral neuropathy .  Renal: Patient does not have known baseline CKD. She   is not on an ACEI/ARB at present.    3) Dyslipidemia:    - She was started on Statin therapy in 12/2020 -She is tolerating this without side effects   Continue Rosuvastatin 20 mg daily        4) Hx Pituitary Microadenoma:    - She was on Bromocriptine for 3 decades, Has been off since 2021 - MRI from 2006 showed a 6 mm adenoma  -All pituitary hormones have been normal in May 2022 -She is concerned about her pituitary, but I did explain to the patient that pituitary hormones have consistently come back normal over the past year and there is no indication for bromocriptine therapy anymore -Prolactin remains normal    4) Elevated BP at home and LE edema:  - His  Bp is well controlled today but she endorses elevated BP at home, she was also noted with LE edema on  today's exam  - Will defer to cardiology      F/U in 4 months   Signed electronically by: Mack Guise, MD  [facility] Endocrinology  [facility] [address] [place], [place] [address] Phone: [phone] FAX: [phone]   CC: Vania Rea, [place] [address] Phone: [phone]  Fax: [phone]  Return to Endocrinology clinic as below: Future Appointments  Date Time Provider Miranda  03/15/2022 10:30 AM [facility] PHARMACIST [facility]  03/15/2022 11:00 AM [facility] LAB [facility]  07/23/2022 11:50 AM Armani Brar, Melanie Crazier, MD [facility] None

## 2022-03-15 ENCOUNTER — Ambulatory Visit: Payer: BC Managed Care – PPO | Admitting: Pharmacist

## 2022-03-15 ENCOUNTER — Other Ambulatory Visit: Payer: BC Managed Care – PPO

## 2022-03-15 VITALS — BP 144/88 | HR 80 | Wt 277.0 lb

## 2022-03-15 DIAGNOSIS — I10 Essential (primary) hypertension: Secondary | ICD-10-CM

## 2022-03-15 DIAGNOSIS — Z6841 Body Mass Index (BMI) 40.0 and over, adult: Secondary | ICD-10-CM | POA: Diagnosis not present

## 2022-03-15 DIAGNOSIS — Z79899 Other long term (current) drug therapy: Secondary | ICD-10-CM | POA: Diagnosis not present

## 2022-03-15 LAB — BASIC METABOLIC PANEL
BUN/Creatinine Ratio: 31 — ABNORMAL HIGH (ref 12–28)
BUN: 23 mg/dL (ref 8–27)
CO2: 22 mmol/L (ref 20–29)
Calcium: 9.4 mg/dL (ref 8.7–10.3)
Chloride: 105 mmol/L (ref 96–106)
Creatinine, Ser: 0.75 mg/dL (ref 0.57–1.00)
Glucose: 114 mg/dL — ABNORMAL HIGH (ref 70–99)
Potassium: 4.7 mmol/L (ref 3.5–5.2)
Sodium: 142 mmol/L (ref 134–144)
eGFR: 90 mL/min/{1.73_m2} (ref >59)

## 2022-03-15 MED ORDER — SEMAGLUTIDE(0.25 OR 0.5MG/DOS) 2 MG/3ML ~~LOC~~ SOPN: PEN_INJECTOR | SUBCUTANEOUS | 1 refills | Status: DC

## 2022-03-15 MED ORDER — MOUNJARO 2.5 MG/0.5ML ~~LOC~~ SOAJ: 2.5000 mg | SUBCUTANEOUS | 0 refills | Status: DC

## 2022-03-15 NOTE — Patient Instructions (Addendum)
STOP Lantus  Will plan to follow the titration plan as below, pending patient is tolerating each dose before increasing to the next. Can slow titration if needed for tolerability.     Ozempic:  -Month 1: Inject Ozempic 0.25 mg SQ once weekly x 4 weeks -Month 2: Inject Ozempic 0.5 mg  SQ once weekly x 4 weeks -Month 3: Inject Ozempic 1 mg SQ once weekly x 4 weeks -Month 4+: Inject Ozempic 2 mg SQ once weekly  Please call me at [phone] with any questions

## 2022-03-15 NOTE — Progress Notes (Signed)
Patient ID: EVENY ANASTAS                 DOB: [birth year]                    MRN: [id number]     HPI: Miranda Marquez is a 61 y.o. female patient referred to pharmacy clinic by Dr. Shari Prows to initiate weight loss therapy with GLP1-RA. PMH is significant for obesity, pituitary microadenoma resulting in 5 miscarriages, HLD, HTN and gestational DMII. Saw Dr. Delton See in 2017 where she was experiencing dyspnea on exertion. Myoview negative for ischemia. She was lost to follow-up. Returned to clinic in 12/2020 after being diagnosed with HLD and DMII. Specifically, her LDL cholesterol was 170, and her hemoglobin A1C was severely high at 12.4. Ca score 03/2021 242 which was 95% for age, gender, race matched controls. She was started on crestor and metformin at that time. Last saw [name], PA-C on 03/2021 where she was having elevated blood pressures. She was started on valsartan for BP control. Most recent BMI is 41.5  When patient was initially considered for GLP-1 in April, Ozempic was on shortage so she was planned to start on Mounjaro. She presents today to start therapy. She states that her blood sugars are well controlled usually about 125. Her blood pressure has been high in the evenings. Not sure if her cuff is correct. She has taken 4 big trips this summer and her diet has been very poor. She has seen a nutritionist and feels like she knows what to do, just has to start doing it. Her endocrinologist told her to stop taking lantus when she starts GLP-1. She needs a hip replacement, exercise limited. Takes care of her 3 grandchildren. Can swim for exercise or do weights, but hasn't been.   Patient would like to be on Ozempic since it has been on the market longer. Had a bad reaction to the COVID shot and feels more comfortable with something that has been around longer.  Current weight management medications: None  Previously tried meds: None  Current meds that may affect weight: insulin glargine  (increase)  Baseline weight/BMI: 266 / 39.86  Insurance payor:   Diet:  -Breakfast: coffee w/ hazelnut delight  (1 cup) -Lunch:salad w/ grilled chicken -Dinner: fish, slaw -Snacks: cheesecake, chips -Drinks: water 50% eat out Saw a nutritionist   Exercise: needs hip replacement, swim  Family History: The patient's family history includes Hypertension in her father  Social History: Former cigarette smoker (quit 43 years ago). Reports occasional alcohol use - 2 glasses of wine on weekends. Denies drug use. More more   Labs: Lab Results  Component Value Date   HGBA1C 6.6 (A) 03/09/2022    Wt Readings from Last 1 Encounters:  03/09/22 275 lb (124.7 kg)    BP Readings from Last 1 Encounters:  03/09/22 132/86   Pulse Readings from Last 1 Encounters:  03/09/22 92       Component Value Date/Time   CHOL 147 04/03/2021 1330   TRIG 106 04/03/2021 1330   HDL 64 04/03/2021 1330   CHOLHDL 2.3 04/03/2021 1330   LDLCALC 64 04/03/2021 1330    Past Medical History:  Diagnosis Date   Arthritis    Cervical herniated disc    Complication of anesthesia    hypotension  in april after hysterectomy   Gestational diabetes    history..back 21 plus yrs ago.  Now she is ok   Hypertension  not taking meds    Pituitary microadenoma with hyperprolactinemia (HCC)    Psoriasis both elbows, scalp, left thigh, saw dr Allyson Sabal, last week given cream for areas healing   Sleep apnea    never had study done, but things she does have it    Current Outpatient Medications on File Prior to Visit  Medication Sig Dispense Refill   BD PEN NEEDLE NANO U/F 32G X 4 MM MISC USE AS DIRECTED DAILY 100 each 6   dapagliflozin propanediol (FARXIGA) 10 MG TABS tablet Take 1 tablet (10 mg total) by mouth daily before breakfast. 90 tablet 3   glucose blood (ONETOUCH VERIO) test strip Use as instructed 100 each 12   Ibuprofen-Famotidine 800-26.6 MG TABS Take by mouth.     insulin glargine (LANTUS SOLOSTAR)  100 UNIT/ML Solostar Pen Inject 12 Units into the skin daily. 15 mL 11   metFORMIN (GLUCOPHAGE-XR) 500 MG 24 hr tablet Take 2 tablets (1,000 mg total) by mouth in the morning and at bedtime. 360 tablet 3   rosuvastatin (CRESTOR) 20 MG tablet Take 1 tablet (20 mg total) by mouth daily. 90 tablet 2   tirzepatide (MOUNJARO) 2.5 MG/0.5ML Pen Inject 2.5 mg into the skin once a week. 2 mL 0   valsartan (DIOVAN) 160 MG tablet Take 1 tablet (160 mg total) by mouth daily. 90 tablet 3   No current facility-administered medications on file prior to visit.   No Known Allergies  Assessment/Plan:  1. Weight loss - Patient has not met goal of at least 5% of body weight loss with comprehensive lifestyle modifications alone in the past 3-6 months. Pharmacotherapy is appropriate to pursue as augmentation. Will start Ozempic 0.25 mg subcutaneously once weekly. Confirmed patient has no personal or family history of medullary thyroid carcinoma (MTC) or Multiple Endocrine Neoplasia syndrome type 2 (MEN 2).   Advised patient on common side effects including nausea, diarrhea, dyspepsia, decreased appetite, and fatigue. Counseled patient on reducing meal size and how to titrate medication to minimize side effects. Counseled patient to call if intolerable side effects or if experiencing dehydration, abdominal pain, or dizziness. Patient will adhere to dietary modifications and will target at least 150 minutes of moderate intensity exercise weekly.   I have encouraged her to swim and do upper body exercises. I have asked her to try to decrease the amount of creamer she is using as she reports using a lot of flavored creamer. I advised that she let us know when she has her hip replacement as she will need to come off of Ozempic for a period of time prior to.  Injection technique reviewed at today's visit.  GLP1 Agonist Titration Plan:  Will plan to follow the titration plan as below, pending patient is tolerating each dose  before increasing to the next. Can slow titration if needed for tolerability.   Ozempic:  -Month 1: Inject Ozempic 0.25 mg SQ once weekly x 4 weeks -Month 2: Inject Ozempic 0.5 mg  SQ once weekly x 4 weeks -Month 3: Inject Ozempic 1 mg SQ once weekly x 4 weeks -Month 4+: Inject Ozempic 2 mg SQ once weekly  Follow up in 1 month via telephone.  2. Blood pressure- Blood pressure is elevated in clinic today above 130/80. We did discuss either adding another agent or increasing valsartan to $RemoveBefo'320mg'rYQJjkxXzHI$  daily. Patient wanted to see if she lost some weight if it would come down. Advised that we can give it 1-2 months and if still elevated will  need to make an adjustment in medications.  Thank you,  Ramond Dial, Pharm.D, BCPS, CPP [facility]  [address], [place], [place] [address]  Phone: [phone]; Fax: [phone]

## 2022-03-16 ENCOUNTER — Telehealth: Payer: Self-pay | Admitting: Pharmacist

## 2022-03-16 NOTE — Telephone Encounter (Signed)
Patient called stating she is having a colonoscopy in the next 3-4 weeks. Asking if she should wait to start ozempic until after. I advised that since its ideal to stop GLP-1 several weeks prior to anesthesia that it would be best to wait to start ozempic until after she has her colonoscopy. She will continue lantus for now.

## 2022-03-30 ENCOUNTER — Other Ambulatory Visit: Payer: Self-pay | Admitting: Obstetrics & Gynecology

## 2022-03-30 DIAGNOSIS — N6320 Unspecified lump in the left breast, unspecified quadrant: Secondary | ICD-10-CM

## 2022-04-02 DIAGNOSIS — H35033 Hypertensive retinopathy, bilateral: Secondary | ICD-10-CM | POA: Diagnosis not present

## 2022-04-02 DIAGNOSIS — H25813 Combined forms of age-related cataract, bilateral: Secondary | ICD-10-CM | POA: Diagnosis not present

## 2022-04-02 DIAGNOSIS — H40053 Ocular hypertension, bilateral: Secondary | ICD-10-CM | POA: Diagnosis not present

## 2022-04-02 DIAGNOSIS — H524 Presbyopia: Secondary | ICD-10-CM | POA: Diagnosis not present

## 2022-04-08 DIAGNOSIS — R194 Change in bowel habit: Secondary | ICD-10-CM | POA: Diagnosis not present

## 2022-04-08 DIAGNOSIS — K625 Hemorrhage of anus and rectum: Secondary | ICD-10-CM | POA: Diagnosis not present

## 2022-04-08 DIAGNOSIS — Z1211 Encounter for screening for malignant neoplasm of colon: Secondary | ICD-10-CM | POA: Diagnosis not present

## 2022-04-15 ENCOUNTER — Ambulatory Visit
Admission: RE | Admit: 2022-04-15 | Discharge: 2022-04-15 | Disposition: A | Discharge status: Home / Self Care | Payer: BC Managed Care – PPO | Source: Ambulatory Visit | Attending: Obstetrics & Gynecology | Admitting: Obstetrics & Gynecology

## 2022-04-15 DIAGNOSIS — N6320 Unspecified lump in the left breast, unspecified quadrant: Secondary | ICD-10-CM

## 2022-04-15 DIAGNOSIS — N6452 Nipple discharge: Secondary | ICD-10-CM | POA: Diagnosis not present

## 2022-04-30 ENCOUNTER — Telehealth: Payer: Self-pay | Admitting: Pharmacist

## 2022-04-30 NOTE — Telephone Encounter (Signed)
Patient called to say no colonoscopy until Nov. Hip replacement not until Jan. Sh started Ozempic 3 weeks ago. No issues so far. Takes on Thursday. Reviewed titration. Will call patient in a few weeks to follow up.

## 2022-05-19 DIAGNOSIS — Z6839 Body mass index (BMI) 39.0-39.9, adult: Secondary | ICD-10-CM | POA: Diagnosis not present

## 2022-05-19 DIAGNOSIS — Z01419 Encounter for gynecological examination (general) (routine) without abnormal findings: Secondary | ICD-10-CM | POA: Diagnosis not present

## 2022-05-28 MED ORDER — VALSARTAN 320 MG PO TABS: 320.0000 mg | ORAL_TABLET | Freq: Every day | ORAL | 3 refills | Status: DC

## 2022-05-28 NOTE — Addendum Note (Signed)
Addended by: Marcelle Overlie D on: 05/28/2022 11:08 AM   Modules accepted: Orders

## 2022-05-28 NOTE — Telephone Encounter (Signed)
Called pt to follow up on ozempic. She reports her BP has been running 140-150/90's. Not resting much prior to checking. Little bit of constipation- BM qod She is on her second dose of Ozempic 0.'5mg'$ . Will increase valsartan to '320mg'$  daily. She will follow up with me in clinic in 5 weeks (next available). I asked her to call me if systolic >710 or diastolic >626 Needs hip replacement and is in a good amount of pain. Hip replacement not until Jan.

## 2022-06-25 ENCOUNTER — Other Ambulatory Visit: Payer: Self-pay

## 2022-06-25 DIAGNOSIS — R7309 Other abnormal glucose: Secondary | ICD-10-CM

## 2022-06-25 DIAGNOSIS — I10 Essential (primary) hypertension: Secondary | ICD-10-CM

## 2022-06-25 DIAGNOSIS — R011 Cardiac murmur, unspecified: Secondary | ICD-10-CM

## 2022-06-25 DIAGNOSIS — E785 Hyperlipidemia, unspecified: Secondary | ICD-10-CM

## 2022-06-25 MED ORDER — ROSUVASTATIN CALCIUM 20 MG PO TABS: 20.0000 mg | ORAL_TABLET | Freq: Every day | ORAL | 1 refills | Status: DC

## 2022-06-28 NOTE — Telephone Encounter (Signed)
Called patient. She states that she does not want to continue ozempic. She hasn't lost any weight. Also having a lot of constipation dispite taking metamucil. She also was having rectal bleeding that stopped after stopping ozempic (? Hemorrhoids)- having colonoscopy next week. Asking if she should resume her lantus she was taking prior. Advised she check with her endocrinologist.   She has follow up with me 11/16 for her BP which she reports is still high. Really doesn't want more medications. Needs hip replacement. Planning on having it done in Jan. Patient to bring her BP cuff with her to apt.

## 2022-06-29 ENCOUNTER — Telehealth: Payer: Self-pay

## 2022-06-29 NOTE — Telephone Encounter (Signed)
Patient informed and expressed understanding

## 2022-06-29 NOTE — Telephone Encounter (Signed)
Patient states that she has been off the Ozempic for 5 days and would like to stay off of it. She wants to know if she needs to go back on the insulin pen or just continue with Metformin. Patient states that her sugar have been in the 130-140 range since she took [place] last Thursday. Patient states that she has horrible side effects from the [place] and pharmacy was not able to get it. Please advise.

## 2022-07-01 ENCOUNTER — Encounter: Payer: Self-pay | Admitting: Pharmacist

## 2022-07-01 DIAGNOSIS — M1612 Unilateral primary osteoarthritis, left hip: Secondary | ICD-10-CM | POA: Diagnosis not present

## 2022-07-01 DIAGNOSIS — Z96641 Presence of right artificial hip joint: Secondary | ICD-10-CM | POA: Diagnosis not present

## 2022-07-08 ENCOUNTER — Telehealth (HOSPITAL_BASED_OUTPATIENT_CLINIC_OR_DEPARTMENT_OTHER): Payer: Self-pay | Admitting: Cardiology

## 2022-07-08 ENCOUNTER — Ambulatory Visit: Payer: BC Managed Care – PPO

## 2022-07-08 NOTE — Telephone Encounter (Signed)
Patient stated she stopped taking Ozempic and wants to know if she still needs to be seen for her appointment today.

## 2022-07-08 NOTE — Telephone Encounter (Signed)
Spoke with patient. She is having a colonoscopy tomorrow. She says her blood pressure has been good the last 3 days. Has been trying hard with her diet. She has apt in 2 weeks with Dr. Johney Frame. I have asked her to bring her blood pressure cuff and log with her to her visit. I will cancel todays visit.

## 2022-07-08 NOTE — Progress Notes (Unsigned)
Patient ID: [name]                 DOB: November 13, 1960                      MRN: [id number]      HPI: Miranda Marquez is a 61 y.o. female referred by Dr. Johney Marquez to HTN clinic. PMH is significant for obesity, pituitary microadenoma resulting in 5 miscarriages, HLD, HTN and gestational DMII. Her last visit was in 02/2022, patient blood pressure was 144/88. At the time she wanted to see if weight loss would help benefit her before adding on another agent or increasing her valsartan from 160 mg to 320 mg QD. Patient was started on ozempic that was discontinued in 04/2022 due to side effect profile and not seeing weight loss. She reported that her blood pressure was still high, but still doesn't want more medications. Exercise still limited due to needing hip replacement with plans to have it done in January.   Current HTN meds: valsartan 160 mg QD Previously tried:  BP goal: <130/80  Family History:   Family History  Problem Relation Age of Onset   Hypertension Father     Social History:  reports that she quit smoking about 43 years ago. Her smoking use included cigarettes. She has never used smokeless tobacco. She reports current alcohol use of about 2.0 standard drinks of alcohol per week. She reports that she does not use drugs.   Diet:   Exercise:   Home BP readings:   Wt Readings from Last 3 Encounters:  03/15/22 277 lb (125.6 kg)  03/09/22 275 lb (124.7 kg)  11/27/21 266 lb (120.7 kg)   BP Readings from Last 3 Encounters:  03/15/22 (!) 144/88  03/09/22 132/86  11/27/21 (!) 152/100   Pulse Readings from Last 3 Encounters:  03/15/22 80  03/09/22 92  11/27/21 91    Renal function: CrCl cannot be calculated (Patient's most recent lab result is older than the maximum 21 days allowed.).  Past Medical History:  Diagnosis Date   Arthritis    Cervical herniated disc    Complication of anesthesia    hypotension  in april after hysterectomy   Gestational diabetes     history..back 21 plus yrs ago.  Now she is ok   Hypertension    not taking meds    Pituitary microadenoma with hyperprolactinemia (HCC)    Psoriasis both elbows, scalp, left thigh, saw dr Miranda Marquez, last week given cream for areas healing   Sleep apnea    never had study done, but things she does have it    Current Outpatient Medications on File Prior to Visit  Medication Sig Dispense Refill   BD PEN NEEDLE NANO U/F 32G X 4 MM MISC USE AS DIRECTED DAILY 100 each 6   dapagliflozin propanediol (FARXIGA) 10 MG TABS tablet Take 1 tablet (10 mg total) by mouth daily before breakfast. 90 tablet 3   glucose blood (ONETOUCH VERIO) test strip Use as instructed 100 each 12   Ibuprofen-Famotidine 800-26.6 MG TABS Take by mouth.     metFORMIN (GLUCOPHAGE-XR) 500 MG 24 hr tablet Take 2 tablets (1,000 mg total) by mouth in the morning and at bedtime. 360 tablet 3   rosuvastatin (CRESTOR) 20 MG tablet Take 1 tablet (20 mg total) by mouth daily. 90 tablet 1   valsartan (DIOVAN) 320 MG tablet Take 1 tablet (320 mg total) by mouth daily. 90 tablet 3  No current facility-administered medications on file prior to visit.    No Known Allergies  There were no vitals taken for this visit.   Assessment/Plan: -assess home readings, home cuff -assess adherence to the medication valsartan -Diet: has it improved since summer time?  -Exercise: able to do upper body exercises, swim? -Discuss inc valsartan or adding second agent in combination      No BP recorded.  {Refresh Note OR Click here to enter BP  :1}***   1. Hypertension -   No problem-specific Assessment & Plan notes found for this encounter.  '@MTPCOMPLETEDLIST'$ @   Thank you  Miranda Marquez, Pharm.D, BCPS, CPP [facility] [address], [place], [place] [address]  Phone: [phone]; Fax: [phone]

## 2022-07-09 DIAGNOSIS — K5730 Diverticulosis of large intestine without perforation or abscess without bleeding: Secondary | ICD-10-CM | POA: Diagnosis not present

## 2022-07-09 DIAGNOSIS — Z1211 Encounter for screening for malignant neoplasm of colon: Secondary | ICD-10-CM | POA: Diagnosis not present

## 2022-07-09 DIAGNOSIS — D128 Benign neoplasm of rectum: Secondary | ICD-10-CM | POA: Diagnosis not present

## 2022-07-09 DIAGNOSIS — K621 Rectal polyp: Secondary | ICD-10-CM | POA: Diagnosis not present

## 2022-07-19 ENCOUNTER — Telehealth: Payer: Self-pay

## 2022-07-19 NOTE — Telephone Encounter (Signed)
..     Pre-operative Risk Assessment    Patient Name: Miranda Marquez  DOB: 1960/11/07 MRN: [id number]      Request for Surgical Clearance    Procedure:   LEFT TOTAL HIP ARTHROPLASTY  Date of Surgery:  Clearance 08/23/22                                 Surgeon:  DR Paralee Cancel Surgeon's Group or Practice Name:  [facility] Phone number:  [phone] Fax number:  [phone]   Type of Clearance Requested:   - Medical    Type of Anesthesia:  Spinal   Additional requests/questions:    SignedJunita, [place]   07/19/2022, 2:36 PM

## 2022-07-19 NOTE — Telephone Encounter (Signed)
    Patient Name: Miranda Marquez  DOB: 12-25-1960 MRN: [id number]  Primary Cardiologist: Freada Bergeron, MD  Chart reviewed as part of pre-operative protocol coverage.   The patient already has an upcoming appointment scheduled 07/22/22 with Dr. Johney Frame at which time this clearance should be addressed. (Procedure date of 08/23/22 falls after appointment.)  - I added "preop" comment to appointment notes so that provider is aware to address at time of OV. Per office protocol, the provider seeing this patient should forward their finalized clearance decision to requesting party below.  - Will fax update to requesting surgeon so they are aware. Will remove from preop box as separate preop APP input not necessary at this time.  Charlie Pitter, PA-C 07/19/2022, 4:47 PM

## 2022-07-20 NOTE — Progress Notes (Unsigned)
Cardiology Office Note:    Date:  07/22/2022   ID:  Miranda Marquez, DOB 1961-04-29, MRN [id number]  PCP:  Miranda Rea, MD   [facility] Providers Cardiologist:  Freada Bergeron, MD {  Referring MD: Miranda Rea, MD    History of Present Illness:    Miranda Marquez is a 61 y.o. 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 Meda Marquez in 2017 who now presents to clinic for follow-up.  Saw Dr. Meda Marquez in 2017 where she was experiencing dyspnea on exertion. TTE with LVEF 55-60%, G1DD, mild ascending aortic aneurysm 52m. Myoview negative for ischemia. She was lost to follow-up.  Returned to clinic in 12/2020 after being diagnosed with HLD and DMII. Specifically, her LDL cholesterol is  170, and her hemoglobin A1C is severely high at 12.4. Ca score 03/2021 242 which was 95% for age, gender, race matched controls. TTE 03/2021 with LVEF 60-65%, G1DD, GLS -21.3, moderate LAE, normal RV, trivial MR. We started her on crestor and metformin at that time.   Saw SRichardson Dopp PA-C on 03/2021 where she was having elevated blood pressures. She was started on valsartan for BP control. Her valsartan was increased to '160mg'$  daily. Ozempic was started but she could not tolerate it.  Was last seen in clinic 11/2021 where her BP remained elevated. Mobility was also limited due to hip pain.   Today, the patient overall feels well. Did not tolerate ozempic due to significant abdominal discomfort. Otherwise, no chest pain, SOB, LE edema, orthopnea or PND. Has significant left hip pain and is planned to have left hip replacement. Given that mobility is limited and she cannot perform >4METs, she wishes to pursue stress testing.   Blood pressure has been running 130-150s/70-90s. Currently taking valssartan '320mg'$  daily. Notably her cuff was about 348mg higher than manual BP obtained today. She is going to purchase a new one prior to adjusting her  medications.    Past Medical History:  Diagnosis Date   Arthritis    Cervical herniated disc    Complication of anesthesia    hypotension  in april after hysterectomy   Gestational diabetes    history..back 21 plus yrs ago.  Now she is ok   Hypertension    not taking meds    Pituitary microadenoma with hyperprolactinemia (HCC)    Psoriasis both elbows, scalp, left thigh, saw dr luAllyson Saballast week given cream for areas healing   Sleep apnea    never had study done, but things she does have it    Past Surgical History:  Procedure Laterality Date   ABDOMINAL HYSTERECTOMY     APPENDECTOMY     BREAST BIOPSY Right 10/11/2014   BREAST EXCISIONAL BIOPSY Right 07/2015   BREAST EXCISIONAL BIOPSY Right    BREAST LUMPECTOMY WITH RADIOACTIVE SEED LOCALIZATION Right 08/07/2015   Procedure: RIGHT BREAST LUMPECTOMY WITH RADIOACTIVE SEED LOCALIZATION AND EXCISION OF BREAST MASS X2;  Surgeon: DoCoralie KeensMD;  [facility] [facility] Service: General;  Laterality: Right;   BREAST SURGERY     DILATION AND CURETTAGE OF UTERUS     FOOT SURGERY     left heel     HERNIA REPAIR     umbilical   MAXIMUM ACCESS (MAS)POSTERIOR LUMBAR INTERBODY FUSION (PLIF) 1 LEVEL N/A 09/09/2016   Procedure: MAXIMUM ACCESS SURGERY POSTERIOR LUMBAR INTERBODY FUSION LUMBAR THREE-FOUR;  Surgeon: DaEustace MooreMD;  [facility]  Service: Neurosurgery;  Laterality: N/A;   MINI-LAPAROTOMY W/ TUBAL LIGATION     ROBOTIC ASSISTED TOTAL HYSTERECTOMY WITH BILATERAL SALPINGO OOPHERECTOMY Bilateral 12/03/2014   Procedure: ROBOTIC ASSISTED TOTAL HYSTERECTOMY WITH BILATERAL SALPINGO OOPHORECTOMY;  Surgeon: Miranda Amber, MD;  Location: [facility];  Service: Gynecology;  Laterality: Bilateral;   TOTAL HIP ARTHROPLASTY Right 10/18/2017   Procedure: RIGHT TOTAL HIP ARTHROPLASTY ANTERIOR APPROACH;  Surgeon: Miranda Cancel, MD;  Location: [facility];  Service: Orthopedics;  Laterality: Right;  70 mins   TUBAL LIGATION      Current  Medications: Current Meds  Medication Sig   BD PEN NEEDLE NANO U/F 32G X 4 MM MISC USE AS DIRECTED DAILY   diclofenac (VOLTAREN) 75 MG EC tablet Take 75 mg by mouth 2 (two) times daily.   glucose blood (ONETOUCH VERIO) test strip Use as instructed   Ibuprofen-Famotidine 800-26.6 MG TABS Take by mouth.   metFORMIN (GLUCOPHAGE-XR) 500 MG 24 hr tablet Take 2 tablets (1,000 mg total) by mouth in the morning and at bedtime.   [DISCONTINUED] dapagliflozin propanediol (FARXIGA) 10 MG TABS tablet Take 1 tablet (10 mg total) by mouth daily before breakfast.   [DISCONTINUED] rosuvastatin (CRESTOR) 20 MG tablet Take 1 tablet (20 mg total) by mouth daily.   [DISCONTINUED] valsartan (DIOVAN) 320 MG tablet Take 1 tablet (320 mg total) by mouth daily.     Allergies:   Patient has no known allergies.   Social History   Socioeconomic History   Marital status: Married    Spouse name: Not on file   Number of children: Not on file   Years of education: Not on file   Highest education level: Not on file  Occupational History   Not on file  Tobacco Use   Smoking status: Former    Types: Cigarettes    Quit date: 08/23/1978    Years since quitting: 43.9   Smokeless tobacco: Never  Vaping Use   Vaping Use: Never used  Substance and Sexual Activity   Alcohol use: Yes    Alcohol/week: 2.0 standard drinks of alcohol    Types: 2 Glasses of wine per week    Comment: occasional, weekends   Drug use: No   Sexual activity: Yes  Other Topics Concern   Not on file  Social History Narrative   Not on file   Social Determinants of Health   Financial Resource Strain: Not on file  Food Insecurity: Not on file  Transportation Needs: Not on file  Physical Activity: Not on file  Stress: Not on file  Social Connections: Not on file     Family History: The patient's family history includes Hypertension in her father.  ROS:   Please see the history of present illness.    Review of Systems   Constitutional:  Negative for chills, diaphoresis, fever and malaise/fatigue.  HENT:  Negative for congestion, nosebleeds and sore throat.   Eyes:  Negative for blurred vision and pain.  Respiratory:  Negative for cough, shortness of breath and wheezing.   Cardiovascular:  Negative for chest pain, palpitations, orthopnea, claudication, leg swelling and PND.  Gastrointestinal:  Negative for blood in stool, constipation, diarrhea, nausea and vomiting.  Genitourinary:  Negative for dysuria, hematuria and urgency.  Musculoskeletal:  Negative for falls and myalgias.  Neurological:  Positive for dizziness. Negative for seizures, loss of consciousness and weakness.  Endo/Heme/Allergies:  Does not bruise/bleed easily.  Psychiatric/Behavioral:  Negative for depression. The patient is not nervous/anxious and does not have insomnia.  EKGs/Labs/Other Studies Reviewed:    The following studies were reviewed today: Ca score 03/2021:   FINDINGS: Coronary Calcium Score:   Left main: 0   Left anterior descending artery: 222   Left circumflex artery: 15   Right coronary artery: 5.4   Total: 242.4   Percentile: 95th   Pericardium: Normal.   Ascending Aorta: Normal caliber.   Non-cardiac: See separate report from [facility].   IMPRESSION: Coronary calcium score of 242.5. This was 95th percentile for age-, race-, and sex-matched controls.  TTE 03/2021: IMPRESSIONS     1. Prominent pulmonary vein a wave reversal (elevated LVEDP with normal  mean LA pressure). Left ventricular ejection fraction, by estimation, is  60 to 65%. Left ventricular ejection fraction by 3D volume is 62 %. The  left ventricle has normal function.  The left ventricle has no regional wall motion abnormalities. There is  mild concentric left ventricular hypertrophy. Left ventricular diastolic  parameters are consistent with Grade I diastolic dysfunction (impaired  relaxation). Elevated left  ventricular  end-diastolic pressure. The E/e' is 9.3. The average left ventricular  global longitudinal strain is -21.3 %. The global longitudinal strain is  normal.   2. Right ventricular systolic function is normal. The right ventricular  size is normal.   3. Left atrial size was moderately dilated.   4. The mitral valve is normal in structure. Trivial mitral valve  regurgitation. No evidence of mitral stenosis.   5. The aortic valve is normal in structure. Aortic valve regurgitation is  not visualized. No aortic stenosis is present.   6. The inferior vena cava is normal in size with greater than 50%  respiratory variability, suggesting right atrial pressure of 3 mmHg.   TTE 2017: Study Conclusions   - Left ventricle: The cavity size was normal. Wall thickness was    increased in a pattern of mild LVH. Systolic function was normal.    The estimated ejection fraction was in the range of 55% to 60%.    Wall motion was normal; there were no regional wall motion    abnormalities. Doppler parameters are consistent with abnormal    left ventricular relaxation (grade 1 diastolic dysfunction).  - Aortic valve: There was no stenosis.  - Aorta: Ascending aortic diameter: 38 mm (S).  - Ascending aorta: The ascending aorta was mildly dilated.  - Mitral valve: There was no significant regurgitation.  - Right ventricle: The cavity size was normal. Systolic function    was normal.  - Pulmonary arteries: No complete TR doppler jet so unable to    estimate PA systolic pressure.   Impressions:   - Normal LV size with mild LV hypertrophy. EF 55-60%. Normal RV    size and systolic function. No significant valvular    abnormalities.   Myoview 2017: Nuclear stress EF: 53%. There was no ST segment deviation noted during stress. The study is normal. This is a low risk study. The left ventricular ejection fraction is normal (55-65%).   Normal pharmacologic nuclear study with no evidence of  prior infarct or ischemia.    EKG:   01/01/2021: Sinus tachycardia, rate 105 bpm, RBBB 11/27/2021: not done today  Recent Labs: 03/15/2022: BUN 23; Creatinine, Ser 0.75; Potassium 4.7; Sodium 142  Recent Lipid Panel    Component Value Date/Time   CHOL 147 04/03/2021 1330   TRIG 106 04/03/2021 1330   HDL 64 04/03/2021 1330   CHOLHDL 2.3 04/03/2021 1330   LDLCALC 64 04/03/2021 1330  Risk Assessment/Calculations:       Physical Exam:    VS:  BP (!) 140/90   Pulse 80   Ht 5' 8.5" (1.74 m)   Wt 271 lb 9.6 oz (123.2 kg)   SpO2 95%   BMI 40.70 kg/m     Wt Readings from Last 3 Encounters:  07/22/22 271 lb 9.6 oz (123.2 kg)  03/15/22 277 lb (125.6 kg)  03/09/22 275 lb (124.7 kg)     GEN: Well nourished, well developed in no acute distress HEENT: Normal NECK: No JVD; No carotid bruits CARDIAC: RRR, 1/6 systolic murmur, no rubs, no gallops RESPIRATORY:  Clear to auscultation without rales, wheezing or rhonchi  ABDOMEN: Soft, non-tender, non-distended MUSCULOSKELETAL:  No edema; No deformity  SKIN: Warm and dry NEUROLOGIC:  Alert and oriented x 3 PSYCHIATRIC:  Normal affect   ASSESSMENT:    1. Pre-op evaluation   2. Murmur   3. Elevated hemoglobin A1c   4. Morbid obesity with BMI of 40.0-44.9, adult ([place])   5. Hyperlipidemia, unspecified hyperlipidemia type   6. Coronary artery calcification   7. Essential hypertension   8. Diabetes mellitus with coincident hypertension ([place])   9. Pure hypercholesterolemia    PLAN:    In order of problems listed above:  #Pre-operative Evaluation: Planned for left THA. Currently mobility is limited due to joint pain and cannot perform >4METs. Will check myoview prior to surgery. -Check myoview  #Coronary Artery Calcification: Ca score 242 (95%). No anginal symptoms. Tolerating crestor. Discussed lifestyle modifications and prevention at length today. -Continue crestor '20mg'$  daily -Lifestyle modifications as  below  #Hyperlipidemia: LDL improved to 64.  -Continue crestor '20mg'$  daily -Checking lipids with PCP next week and will forward to [place]  #DMII: -Continue metformin '1000mg'$  BID -Continue insulin -Continue farxiga '10mg'$  daily -Did not tolerate ozempic  #HTN: Blood pressure 130s in office but measuring 160 on home cuff. She will purchase a new cuff and send new BP log as suspect her cuff is inaccurate. -Continue valsartan '320mg'$  daily  #Ascending Aortic Dilation: Normal on most recent TTE in 03/2021. No further monitoring needed.  #Morbid Obesity: BMI 40.  -Discussed healthy diet and lifestyle at length today -Will start mounjaro as above  Exercise recommendations: Goal of exercising for at least 30 minutes a day, at least 5 times per week.  Please exercise to a moderate exertion.  This means that while exercising it is difficult to speak in full sentences, however you are not so short of breath that you feel you must stop, and not so comfortable that you can carry on a full conversation.  Exertion level should be approximately a 5/10, if 10 is the most exertion you can perform.  Diet recommendations: Recommend a heart healthy diet such as the Mediterranean diet.  This diet consists of plant based foods, healthy fats, lean meats, olive oil.  It suggests limiting the intake of simple carbohydrates such as white breads, pastries, and pastas.  It also limits the amount of red meat, wine, and dairy products such as cheese that one should consume on a daily basis.  Follow-up with MD/APP in 6 months   Medication Adjustments/Labs and Tests Ordered: Current medicines are reviewed at length with the patient today.  Concerns regarding medicines are outlined above.  Orders Placed This Encounter  Procedures   Myocardial Perfusion Imaging   EKG 12-Lead   Meds ordered this encounter  Medications   dapagliflozin propanediol (FARXIGA) 10 MG TABS tablet    Sig:  Take 1 tablet (10 mg total) by mouth  daily before breakfast.    Dispense:  90 tablet    Refill:  3   rosuvastatin (CRESTOR) 20 MG tablet    Sig: Take 1 tablet (20 mg total) by mouth daily.    Dispense:  90 tablet    Refill:  3   valsartan (DIOVAN) 320 MG tablet    Sig: Take 1 tablet (320 mg total) by mouth daily.    Dispense:  90 tablet    Refill:  3    Patient Instructions  Medication Instructions:  NO CHANGES *If you need a refill on your cardiac medications before your next appointment, please call your pharmacy*   Lab Work: NONE If you have labs (blood work) drawn today and your tests are completely normal, you will receive your results only by: [place] (if you have MyChart) OR A paper copy in the mail If you have any lab test that is abnormal or we need to change your treatment, we will call you to review the results.   Testing/Procedures: Your physician has requested that you have a lexiscan myoview. For further information please visit [email or web address]. Please follow instruction sheet, as given.    Follow-Up: At [facility], you and your health needs are our priority.  As part of our continuing mission to provide you with exceptional heart care, we have created designated Provider Care Teams.  These Care Teams include your primary Cardiologist (physician) and Advanced Practice Providers (APPs -  Physician Assistants and Nurse Practitioners) who all work together to provide you with the care you need, when you need it.  We recommend signing up for the patient portal called "MyChart".  Sign up information is provided on this After Visit Summary.  MyChart is used to connect with patients for Virtual Visits (Telemedicine).  Patients are able to view lab/test results, encounter notes, upcoming appointments, etc.  Non-urgent messages can be sent to your provider as well.   To learn more about what you can do with MyChart, go to [email or web address].    Your next appointment:   6  month(s)  The format for your next appointment:   In Person  Provider:   Freada Bergeron, MD     Other Instructions NONE  Important Information About Sugar           Signed, Freada Bergeron, MD  07/22/2022 10:57 AM    [place]

## 2022-07-22 ENCOUNTER — Encounter: Payer: Self-pay | Admitting: Cardiology

## 2022-07-22 ENCOUNTER — Ambulatory Visit: Payer: BC Managed Care – PPO | Attending: Cardiology | Admitting: Cardiology

## 2022-07-22 ENCOUNTER — Encounter: Payer: Self-pay | Admitting: *Deleted

## 2022-07-22 VITALS — BP 140/90 | HR 80 | Ht 68.5 in | Wt 271.6 lb

## 2022-07-22 DIAGNOSIS — I2584 Coronary atherosclerosis due to calcified coronary lesion: Secondary | ICD-10-CM

## 2022-07-22 DIAGNOSIS — Z6841 Body Mass Index (BMI) 40.0 and over, adult: Secondary | ICD-10-CM

## 2022-07-22 DIAGNOSIS — I2510 Atherosclerotic heart disease of native coronary artery without angina pectoris: Secondary | ICD-10-CM

## 2022-07-22 DIAGNOSIS — E119 Type 2 diabetes mellitus without complications: Secondary | ICD-10-CM

## 2022-07-22 DIAGNOSIS — E785 Hyperlipidemia, unspecified: Secondary | ICD-10-CM

## 2022-07-22 DIAGNOSIS — R011 Cardiac murmur, unspecified: Secondary | ICD-10-CM

## 2022-07-22 DIAGNOSIS — Z01818 Encounter for other preprocedural examination: Secondary | ICD-10-CM | POA: Diagnosis not present

## 2022-07-22 DIAGNOSIS — E7800 Pure hypercholesterolemia, unspecified: Secondary | ICD-10-CM

## 2022-07-22 DIAGNOSIS — I10 Essential (primary) hypertension: Secondary | ICD-10-CM

## 2022-07-22 DIAGNOSIS — R7309 Other abnormal glucose: Secondary | ICD-10-CM | POA: Diagnosis not present

## 2022-07-22 MED ORDER — ROSUVASTATIN CALCIUM 20 MG PO TABS: 20.0000 mg | ORAL_TABLET | Freq: Every day | ORAL | 3 refills | Status: DC

## 2022-07-22 MED ORDER — DAPAGLIFLOZIN PROPANEDIOL 10 MG PO TABS: 10.0000 mg | ORAL_TABLET | Freq: Every day | ORAL | 3 refills | Status: DC

## 2022-07-22 MED ORDER — VALSARTAN 320 MG PO TABS: 320.0000 mg | ORAL_TABLET | Freq: Every day | ORAL | 3 refills | Status: DC

## 2022-07-22 NOTE — Patient Instructions (Signed)
Medication Instructions:  NO CHANGES *If you need a refill on your cardiac medications before your next appointment, please call your pharmacy*   Lab Work: NONE If you have labs (blood work) drawn today and your tests are completely normal, you will receive your results only by: [place] (if you have MyChart) OR A paper copy in the mail If you have any lab test that is abnormal or we need to change your treatment, we will call you to review the results.   Testing/Procedures: Your physician has requested that you have a lexiscan myoview. For further information please visit [email or web address]. Please follow instruction sheet, as given.    Follow-Up: At [facility], you and your health needs are our priority.  As part of our continuing mission to provide you with exceptional heart care, we have created designated Provider Care Teams.  These Care Teams include your primary Cardiologist (physician) and Advanced Practice Providers (APPs -  Physician Assistants and Nurse Practitioners) who all work together to provide you with the care you need, when you need it.  We recommend signing up for the patient portal called "MyChart".  Sign up information is provided on this After Visit Summary.  MyChart is used to connect with patients for Virtual Visits (Telemedicine).  Patients are able to view lab/test results, encounter notes, upcoming appointments, etc.  Non-urgent messages can be sent to your provider as well.   To learn more about what you can do with MyChart, go to [email or web address].    Your next appointment:   6 month(s)  The format for your next appointment:   In Person  Provider:   Freada Bergeron, MD     Other Instructions NONE  Important Information About Sugar

## 2022-07-23 ENCOUNTER — Ambulatory Visit: Payer: BC Managed Care – PPO | Admitting: Internal Medicine

## 2022-07-23 DIAGNOSIS — E1165 Type 2 diabetes mellitus with hyperglycemia: Secondary | ICD-10-CM

## 2022-07-23 NOTE — Progress Notes (Deleted)
Name: Miranda Marquez  Age/ Sex: 61 y.o., female   MRN/ DOB: [id number], 1961-04-16     PCP: Vania Rea, MD   Reason for Endocrinology Evaluation: Type 2 Diabetes Mellitus  Initial Endocrine Consultative Visit: 01/16/2021    PATIENT IDENTIFIER: [name] is a 61 y.o. female with a past medical history of HTN, T2DM, HTN and dyslipidemia. The patient has followed with Endocrinology clinic since 01/16/2021 for consultative assistance with management of her diabetes.  DIABETIC HISTORY:  Miranda Marquez was diagnosed with DM 07/2020.  She had gestations diabetes > 25 yrs ago. Her hemoglobin A1c has ranged from 10.2% in 2022, peaking at 12.4% in 2021  On her initial visit to our clinic she had an A1c of 10.7% she was not on any medications, we started metformin and insulin   By 04/2021 Wilder Glade started   She was started on GLP-1 agonist in 2023 through cardiology but developed GI side effects.  PITUITARY HISTORY: She has a diagnosis of pituitary microadenoma in 2006. With an MRI showing a 5 mm adenoma . She has a Hx of hyperprolactinemia . She was on Bromocriptine until 2021 . Repeat Prolactin was normal x 2 in 2022 and we opted to hold off on Bromocriptine.      SUBJECTIVE:   During the last visit (03/09/2022): A1c 6.6%   Today (07/23/2022): Miranda Marquez  is here for a follow up on diabetes management. She checks her blood sugars 1 times daily. The patient has not had hypoglycemic episodes since the last clinic visit.   She has been on vacation with dietary indiscretions  Denies nausea or diarrhea  She has been noted with weight gain  Denies pancreatitis or heartburn  She was started on Mounjaro by cardiology  She endorses elevated BP at home , she has been noted with LE edema as well    HOME DIABETES REGIMEN:  Metformin 500 mg XR 2 tabs BID  Lantus 10 units daily  Farxiga 10 mg daily     Statin: yes ACE-I/ARB: no Prior Diabetic Education: no   GLUCOSE LOG:  114- 193 mg/dL in  the past month    DIABETIC COMPLICATIONS: Microvascular complications:   Denies: CKD, neuropathy, retinopathy Last Eye Exam: Completed yrs ago    Macrovascular complications:   Denies: CAD, CVA, PVD   HISTORY:  Past Medical History:  Past Medical History:  Diagnosis Date   Arthritis    Cervical herniated disc    Complication of anesthesia    hypotension  in april after hysterectomy   Gestational diabetes    history..back 21 plus yrs ago.  Now she is ok   Hypertension    not taking meds    Pituitary microadenoma with hyperprolactinemia (HCC)    Psoriasis both elbows, scalp, left thigh, saw dr Allyson Sabal, last week given cream for areas healing   Sleep apnea    never had study done, but things she does have it   Past Surgical History:  Past Surgical History:  Procedure Laterality Date   ABDOMINAL HYSTERECTOMY     APPENDECTOMY     BREAST BIOPSY Right 10/11/2014   BREAST EXCISIONAL BIOPSY Right 07/2015   BREAST EXCISIONAL BIOPSY Right    BREAST LUMPECTOMY WITH RADIOACTIVE SEED LOCALIZATION Right 08/07/2015   Procedure: RIGHT BREAST LUMPECTOMY WITH RADIOACTIVE SEED LOCALIZATION AND EXCISION OF BREAST MASS X2;  Surgeon: Coralie Keens, MD;  Location: [place];  Service: General;  Laterality: Right;   BREAST SURGERY     DILATION  AND CURETTAGE OF UTERUS     FOOT SURGERY     left heel     HERNIA REPAIR     umbilical   MAXIMUM ACCESS (MAS)POSTERIOR LUMBAR INTERBODY FUSION (PLIF) 1 LEVEL N/A 09/09/2016   Procedure: MAXIMUM ACCESS SURGERY POSTERIOR LUMBAR INTERBODY FUSION LUMBAR THREE-FOUR;  Surgeon: Eustace Moore, MD;  Location: [facility];  Service: Neurosurgery;  Laterality: N/A;   MINI-LAPAROTOMY W/ TUBAL LIGATION     ROBOTIC ASSISTED TOTAL HYSTERECTOMY WITH BILATERAL SALPINGO OOPHERECTOMY Bilateral 12/03/2014   Procedure: ROBOTIC ASSISTED TOTAL HYSTERECTOMY WITH BILATERAL SALPINGO OOPHORECTOMY;  Surgeon: Everitt Amber, MD;  [facility];  Service: Gynecology;  Laterality: Bilateral;    TOTAL HIP ARTHROPLASTY Right 10/18/2017   Procedure: RIGHT TOTAL HIP ARTHROPLASTY ANTERIOR APPROACH;  Surgeon: Paralee Cancel, MD;  [facility];  Service: Orthopedics;  Laterality: Right;  70 mins   TUBAL LIGATION     Social History:  reports that she quit smoking about 43 years ago. Her smoking use included cigarettes. She has never used smokeless tobacco. She reports current alcohol use of about 2.0 standard drinks of alcohol per week. She reports that she does not use drugs. Family History:  Family History  Problem Relation Age of Onset   Hypertension Father      HOME MEDICATIONS: Allergies as of 07/23/2022   No Known Allergies      Medication List        Accurate as of July 23, 2022  7:38 AM. If you have any questions, ask your nurse or doctor.          BD Pen Needle Nano U/F 32G X 4 MM Misc Generic drug: Insulin Pen Needle USE AS DIRECTED DAILY   dapagliflozin propanediol 10 MG Tabs tablet Commonly known as: Farxiga Take 1 tablet (10 mg total) by mouth daily before breakfast.   diclofenac 75 MG EC tablet Commonly known as: VOLTAREN Take 75 mg by mouth 2 (two) times daily.   Ibuprofen-Famotidine 800-26.6 MG Tabs Take by mouth.   latanoprost 0.005 % ophthalmic solution Commonly known as: XALATAN Place 1 drop into both eyes at bedtime.   metFORMIN 500 MG 24 hr tablet Commonly known as: GLUCOPHAGE-XR Take 2 tablets (1,000 mg total) by mouth in the morning and at bedtime.   OneTouch Verio test strip Generic drug: glucose blood Use as instructed   rosuvastatin 20 MG tablet Commonly known as: CRESTOR Take 1 tablet (20 mg total) by mouth daily.   valsartan 320 MG tablet Commonly known as: DIOVAN Take 1 tablet (320 mg total) by mouth daily.         OBJECTIVE:   Vital Signs: There were no vitals taken for this visit.  Wt Readings from Last 3 Encounters:  07/22/22 271 lb 9.6 oz (123.2 kg)  03/15/22 277 lb (125.6 kg)  03/09/22 275 lb (124.7  kg)     Exam: General: Pt appears well and is in NAD  Lungs: Clear with good BS bilat with no rales, rhonchi, or wheezes  Heart: RRR with normal S1 and S2 and no gallops; no murmurs; no rub  Extremities: Trace  pretibial edema.   Neuro: MS is good with appropriate affect, pt is alert and Ox3    DM foot exam: 03/09/2022     The skin of the feet is intact without sores or ulcerations. The pedal pulses are 2+ on right and 2+ on left. The sensation is intact to a screening 5.07, 10 gram monofilament bilaterally  DATA REVIEWED:  Lab Results  Component Value Date   HGBA1C 6.6 (A) 03/09/2022   HGBA1C 6.5 (A) 09/09/2021   HGBA1C 6.6 (A) 04/23/2021       Latest Reference Range & Units 03/09/22 12:10  Prolactin ng/mL 16.8    Latest Reference Range & Units 09/09/21 09:41  Sodium 135 - 145 mEq/L 138  Potassium 3.5 - 5.1 mEq/L 4.1  Chloride 96 - 112 mEq/L 104  CO2 19 - 32 mEq/L 26  Glucose 70 - 99 mg/dL 139 (H)  BUN 6 - 23 mg/dL 14  Creatinine 0.40 - 1.20 mg/dL 0.65  Calcium 8.4 - 10.5 mg/dL 9.4  GFR >60.00 mL/min 94.62    Latest Reference Range & Units 09/09/21 09:41  MICROALB/CREAT RATIO 0.0 - 30.0 mg/g 1.2    Latest Reference Range & Units 09/09/21 09:41  Prolactin ng/mL 19.0      BRAIN MRI 03/23/2005   1. Suspect 5 mm prolactinoma in the right inferior aspect of the gland with mild remodeling of the sellar floor as well as mild deviation of the pituitary stalk from right to left.  2. Otherwise negative cranial MRI  ASSESSMENT / PLAN / RECOMMENDATIONS:   1) Type 2 Diabetes Mellitus, optimally controlled, With out complications - Most recent A1c of 6.6 %. Goal A1c < 7.0 %.    - A1c remains stable   -She had declined GLP-1 agonist in the past due to risk of medullary cancer, she has no hx of medullary cancer but she was started on Mounjaro by cardiology in 11/2021 but has not started yet . She is going to see the pharmacists and will start  - Continue Farxiga and  Metformin  - I have asked her to STOP Lantus once she starts on Mounjaro   MEDICATIONS: Continue metformin 500 mg 2 tablets BID  Continue  Lantus 10 units daily Continue  Farxiga 10 mg, 1 tablet daily in the morning  EDUCATION / INSTRUCTIONS: BG monitoring instructions: Patient is instructed to check her blood sugars 1 times a day, Fasting Call [facility] if: BG persistently < 70  I reviewed the Rule of 15 for the treatment of hypoglycemia in detail with the patient. Literature supplied.   2) Diabetic complications:  Eye: Does not have known diabetic retinopathy.  Neuro/ Feet: Does not have known diabetic peripheral neuropathy .  Renal: Patient does not have known baseline CKD. She   is not on an ACEI/ARB at present.    3) Dyslipidemia:    -She was started on Statin therapy in 12/2020 -She is tolerating this without side effects   Continue Rosuvastatin 20 mg daily        4) Hx Pituitary Microadenoma:    - She was on Bromocriptine for 3 decades, Has been off since 2021 - MRI from 2006 showed a 6 mm adenoma  -All pituitary hormones have been normal in May 2022 -She continues to be concerned about her pituitary, last prolactin normal in July 2023   F/U in 4 months   Signed electronically by: Mack Guise, MD  [facility]  [facility] [address] [place], [place] [address] Phone: [phone] FAX: [phone]   CC: Vania Rea, [place] [address] Phone: [phone]  Fax: [phone]  Return to Endocrinology clinic as below: Future Appointments  Date Time Provider [place]  07/23/2022 11:50 AM Aylen Stradford, Melanie Crazier, MD [facility] None  08/05/2022  8:15 AM [facility]  [place] NM2/TREAD [id number]  08/06/2022  1:30 PM [facility] NM2/TREAD [facility]  01/31/2023  9:00 AM Johney Frame, Greer Ee, MD [facility]

## 2022-07-26 NOTE — Telephone Encounter (Signed)
Requesting office sent a duplicate request. I will send FYI to inform them that Dr. Johney Frame saw the pt on 07/22/22, however, she has ordered the pt to have stress test before giving clearance. The pt is scheduled for a Lexiscan Myoview to be done in 2 parts, 12/14 and 08/06/22. Once Dr. Johney Frame has cleared the pt we will be sure to fax over the clearance notes.   I will send this note as an FYI.

## 2022-07-27 ENCOUNTER — Other Ambulatory Visit: Payer: Self-pay | Admitting: Cardiology

## 2022-07-27 DIAGNOSIS — R0602 Shortness of breath: Secondary | ICD-10-CM

## 2022-08-05 ENCOUNTER — Telehealth (HOSPITAL_COMMUNITY): Payer: Self-pay | Admitting: *Deleted

## 2022-08-05 ENCOUNTER — Ambulatory Visit (HOSPITAL_COMMUNITY): Payer: BC Managed Care – PPO

## 2022-08-05 NOTE — Telephone Encounter (Signed)
Per DPR left pt instructions for upcoming MPI study on 08/12/22.

## 2022-08-06 ENCOUNTER — Ambulatory Visit (HOSPITAL_COMMUNITY): Payer: BC Managed Care – PPO

## 2022-08-06 DIAGNOSIS — E119 Type 2 diabetes mellitus without complications: Secondary | ICD-10-CM | POA: Diagnosis not present

## 2022-08-06 DIAGNOSIS — Z01818 Encounter for other preprocedural examination: Secondary | ICD-10-CM | POA: Diagnosis not present

## 2022-08-06 DIAGNOSIS — Z0000 Encounter for general adult medical examination without abnormal findings: Secondary | ICD-10-CM | POA: Diagnosis not present

## 2022-08-06 DIAGNOSIS — Z5181 Encounter for therapeutic drug level monitoring: Secondary | ICD-10-CM | POA: Diagnosis not present

## 2022-08-06 DIAGNOSIS — Z23 Encounter for immunization: Secondary | ICD-10-CM | POA: Diagnosis not present

## 2022-08-06 DIAGNOSIS — Z1159 Encounter for screening for other viral diseases: Secondary | ICD-10-CM | POA: Diagnosis not present

## 2022-08-06 DIAGNOSIS — E7800 Pure hypercholesterolemia, unspecified: Secondary | ICD-10-CM | POA: Diagnosis not present

## 2022-08-12 ENCOUNTER — Ambulatory Visit (HOSPITAL_COMMUNITY): Payer: BC Managed Care – PPO | Attending: Cardiology

## 2022-08-12 DIAGNOSIS — Z01818 Encounter for other preprocedural examination: Secondary | ICD-10-CM | POA: Insufficient documentation

## 2022-08-12 MED ORDER — REGADENOSON 0.4 MG/5ML IV SOLN
0.4000 mg | Freq: Once | INTRAVENOUS | Status: AC
  Administered 2022-08-12: 0.4 mg via INTRAVENOUS

## 2022-08-12 MED ORDER — TECHNETIUM TC 99M TETROFOSMIN IV KIT
30.8000 | PACK | Freq: Once | INTRAVENOUS | Status: AC | PRN
  Administered 2022-08-12: 30.8 via INTRAVENOUS

## 2022-08-13 ENCOUNTER — Ambulatory Visit (HOSPITAL_COMMUNITY): Payer: BC Managed Care – PPO

## 2022-08-13 LAB — MYOCARDIAL PERFUSION IMAGING
LV dias vol: 91 mL (ref 46–106)
LV sys vol: 33 mL
Nuc Stress EF: 63 %
Peak HR: 99 {beats}/min
Rest HR: 73 {beats}/min
Rest Nuclear Isotope Dose: 32.1 mCi
SDS: 0
SRS: 0
SSS: 0
ST Depression (mm): 0 mm
Stress Nuclear Isotope Dose: 30.8 mCi
TID: 1.21

## 2022-08-13 MED ORDER — TECHNETIUM TC 99M TETROFOSMIN IV KIT
32.1000 | PACK | Freq: Once | INTRAVENOUS | Status: AC | PRN
  Administered 2022-08-13: 32.1 via INTRAVENOUS

## 2022-08-18 ENCOUNTER — Other Ambulatory Visit: Payer: Self-pay | Admitting: Internal Medicine

## 2022-08-24 NOTE — Progress Notes (Unsigned)
Name: Miranda Marquez  Age/ Sex: 62 y.o., female   MRN/ DOB: [id number], [birth year]     PCP: Vania Rea, MD   Reason for Endocrinology Evaluation: Type 2 Diabetes Mellitus  Initial Endocrine Consultative Visit: 01/16/2021    Miranda Marquez IDENTIFIER: Miranda Marquez is a 62 y.o. female with a past medical history of HTN, T2DM, HTN and dyslipidemia. The Miranda Marquez has followed with Endocrinology clinic since 01/16/2021 for consultative assistance with management of her diabetes.  DIABETIC HISTORY:  Miranda Marquez was diagnosed with DM 07/2020.  She had gestations diabetes > 25 yrs ago. Her hemoglobin A1c has ranged from 10.2% in 2022, peaking at 12.4% in 2021  On her initial visit to our clinic she had an A1c of 10.7% she was not on any medications, we started metformin and insulin   By 04/2021 Wilder Glade started   PITUITARY HISTORY: She has a diagnosis of pituitary microadenoma in 2006. With an MRI showing a 5 mm adenoma . She has a Hx of hyperprolactinemia . She was on Bromocriptine until 2021 . Repeat Prolactin was normal x 2 in 2022 and we opted to hold off on Bromocriptine.      SUBJECTIVE:   During the last visit (09/09/2021): A1c 6.5% Continued  metformin, decreased  insulin and started [place]      Today (08/24/2022): Miranda Marquez  is here for a follow up on diabetes management. She checks her blood sugars 1 times daily. The Miranda Marquez has not had hypoglycemic episodes since the last clinic visit.   She has been on vacation with dietary indiscretions  Denies nausea or diarrhea  She has been noted with weight gain  Denies pancreatitis or heartburn  She was started on Mounjaro by cardiology  She endorses elevated BP at home , she has been noted with LE edema as well    HOME DIABETES REGIMEN:  Metformin 500 mg XR 2 tabs BID  Lantus 12 units daily - 10 units  Farxiga 10 mg daily     Statin: yes ACE-I/ARB: no Prior Diabetic Education: no   GLUCOSE LOG:  114- 193 mg/dL in the past month     DIABETIC COMPLICATIONS: Microvascular complications:   Denies: CKD, neuropathy, retinopathy Last Eye Exam: Completed yrs ago    Macrovascular complications:   Denies: CAD, CVA, PVD   HISTORY:  Past Medical History:  Past Medical History:  Diagnosis Date   Arthritis    Cervical herniated disc    Complication of anesthesia    hypotension  in april after hysterectomy   Gestational diabetes    history..back 21 plus yrs ago.  Now she is ok   Hypertension    not taking meds    Pituitary microadenoma with hyperprolactinemia (HCC)    Psoriasis both elbows, scalp, left thigh, saw dr Allyson Sabal, last week given cream for areas healing   Sleep apnea    never had study done, but things she does have it   Past Surgical History:  Past Surgical History:  Procedure Laterality Date   ABDOMINAL HYSTERECTOMY     APPENDECTOMY     BREAST BIOPSY Right 10/11/2014   BREAST EXCISIONAL BIOPSY Right 07/2015   BREAST EXCISIONAL BIOPSY Right    BREAST LUMPECTOMY WITH RADIOACTIVE SEED LOCALIZATION Right 08/07/2015   Procedure: RIGHT BREAST LUMPECTOMY WITH RADIOACTIVE SEED LOCALIZATION AND EXCISION OF BREAST MASS X2;  Surgeon: Coralie Keens, MD;  Location: [facility];  Service: General;  Laterality: Right;   BREAST SURGERY     DILATION AND  CURETTAGE OF UTERUS     FOOT SURGERY     left heel     HERNIA REPAIR     umbilical   MAXIMUM ACCESS (MAS)POSTERIOR LUMBAR INTERBODY FUSION (PLIF) 1 LEVEL N/A 09/09/2016   Procedure: MAXIMUM ACCESS SURGERY POSTERIOR LUMBAR INTERBODY FUSION LUMBAR THREE-FOUR;  Surgeon: Eustace Moore, MD;  Location: [facility];  Service: Neurosurgery;  Laterality: N/A;   MINI-LAPAROTOMY W/ TUBAL LIGATION     ROBOTIC ASSISTED TOTAL HYSTERECTOMY WITH BILATERAL SALPINGO OOPHERECTOMY Bilateral 12/03/2014   Procedure: ROBOTIC ASSISTED TOTAL HYSTERECTOMY WITH BILATERAL SALPINGO OOPHORECTOMY;  Surgeon: Everitt Amber, MD;  [facility];  Service: Gynecology;  Laterality: Bilateral;   TOTAL HIP  ARTHROPLASTY Right 10/18/2017   Procedure: RIGHT TOTAL HIP ARTHROPLASTY ANTERIOR APPROACH;  Surgeon: Paralee Cancel, MD;  [facility];  Service: Orthopedics;  Laterality: Right;  70 mins   TUBAL LIGATION     Social History:  reports that she quit smoking about 44 years ago. Her smoking use included cigarettes. She has never used smokeless tobacco. She reports current alcohol use of about 2.0 standard drinks of alcohol per week. She reports that she does not use drugs. Family History:  Family History  Problem Relation Age of Onset   Hypertension Father      HOME MEDICATIONS: Allergies as of 08/25/2022   No Known Allergies      Medication List        Accurate as of August 24, 2022  1:01 PM. If you have any questions, ask your nurse or doctor.          BD Pen Needle Nano U/F 32G X 4 MM Misc Generic drug: Insulin Pen Needle USE AS DIRECTED DAILY   dapagliflozin propanediol 10 MG Tabs tablet Commonly known as: Farxiga Take 1 tablet (10 mg total) by mouth daily before breakfast.   diclofenac 75 MG EC tablet Commonly known as: VOLTAREN Take 75 mg by mouth 2 (two) times daily.   Ibuprofen-Famotidine 800-26.6 MG Tabs Take by mouth.   Lantus SoloStar 100 UNIT/ML Solostar Pen Generic drug: insulin glargine Inject 10 Units into the skin daily.   latanoprost 0.005 % ophthalmic solution Commonly known as: XALATAN Place 1 drop into both eyes at bedtime.   metFORMIN 500 MG 24 hr tablet Commonly known as: GLUCOPHAGE-XR Take 2 tablets (1,000 mg total) by mouth in the morning and at bedtime.   OneTouch Verio test strip Generic drug: glucose blood Use as instructed   rosuvastatin 20 MG tablet Commonly known as: CRESTOR Take 1 tablet (20 mg total) by mouth daily.   valsartan 320 MG tablet Commonly known as: DIOVAN Take 1 tablet (320 mg total) by mouth daily.         OBJECTIVE:   Vital Signs: There were no vitals taken for this visit.  Wt Readings from Last 3  Encounters:  08/12/22 271 lb (122.9 kg)  07/22/22 271 lb 9.6 oz (123.2 kg)  03/15/22 277 lb (125.6 kg)     Exam: General: Miranda Marquez appears well and is in NAD  Lungs: Clear with good BS bilat with no rales, rhonchi, or wheezes  Heart: RRR with normal S1 and S2 and no gallops; no murmurs; no rub  Extremities: Trace  pretibial edema.   Neuro: MS is good with appropriate affect, Miranda Marquez is alert and Ox3    DM foot exam: 03/09/2022     The skin of the feet is intact without sores or ulcerations. The pedal pulses are 2+ on right and  2+ on left. The sensation is intact to a screening 5.07, 10 gram monofilament bilaterally      DATA REVIEWED:  Lab Results  Component Value Date   HGBA1C 6.6 (A) 03/09/2022   HGBA1C 6.5 (A) 09/09/2021   HGBA1C 6.6 (A) 04/23/2021   08/06/2022 A1c 6.2% Na 140 K 4.2 BUN 19 Creatinine 0.69 GFR > 90 LDL 57 TG 111       Latest Reference Range & Units 03/09/22 12:10  Prolactin ng/mL 16.8        BRAIN MRI 03/23/2005   1. Suspect 5 mm prolactinoma in the right inferior aspect of the gland with mild remodeling of the sellar floor as well as mild deviation of the pituitary stalk from right to left.  2. Otherwise negative cranial MRI  ASSESSMENT / PLAN / RECOMMENDATIONS:   1) Type 2 Diabetes Mellitus, optimally controlled, With out complications - Most recent A1c of 6.2 %. Goal A1c < 7.0 %.    - A1c remains stable   -She had declined GLP-1 agonist in the past due to risk of medullary cancer, she has no hx of medullary cancer but she was started on Mounjaro by cardiology in 11/2021 but has not started yet . She is going to see the pharmacists and will start  - Continue Farxiga and Metformin  - I have asked her to STOP Lantus once she starts on Mounjaro   MEDICATIONS: Continue metformin 500 mg 2 tablets BID  Continue  Lantus 10 units daily Continue  Farxiga 10 mg, 1 tablet daily in the morning  EDUCATION / INSTRUCTIONS: BG monitoring instructions:  Miranda Marquez is instructed to check her blood sugars 1 times a day, Fasting Call Miranda Marquez Endocrinology clinic if: BG persistently < 70  I reviewed the Rule of 15 for the treatment of hypoglycemia in detail with the Miranda Marquez. Literature supplied.   2) Diabetic complications:  Eye: Does not have known diabetic retinopathy.  Neuro/ Feet: Does not have known diabetic peripheral neuropathy .  Renal: Miranda Marquez does not have known baseline CKD. She   is not on an ACEI/ARB at present.    3) Dyslipidemia:    - She was started on Statin therapy in 12/2020 -She is tolerating this without side effects   Continue Rosuvastatin 20 mg daily        4) Hx Pituitary Microadenoma:    - She was on Bromocriptine for 3 decades, Has been off since 2021 - MRI from 2006 showed a 6 mm adenoma  -All pituitary hormones have been normal in May 2022 -She is concerned about her pituitary, but I did explain to the Miranda Marquez that pituitary hormones have consistently come back normal over the past year and there is no indication for bromocriptine therapy anymore -Prolactin remains normal       F/U in 4 months   Signed electronically by: Mack Guise, MD  [facility]  [facility] [address] [place], [place] [address] Phone: [phone] FAX: [phone]   CC: Vania Rea, [place] [address] Phone: [phone]  Fax: [phone]  Return to Endocrinology clinic as below: Future Appointments  Date Time Provider [place]  08/25/2022  9:30 AM Lakie Mclouth, Melanie Crazier, MD [facility] None  01/31/2023  9:00 AM Freada Bergeron, MD [facility]

## 2022-08-25 ENCOUNTER — Ambulatory Visit (INDEPENDENT_AMBULATORY_CARE_PROVIDER_SITE_OTHER): Payer: 59 | Admitting: Internal Medicine

## 2022-08-25 ENCOUNTER — Encounter: Payer: Self-pay | Admitting: Internal Medicine

## 2022-08-25 VITALS — BP 136/90 | HR 76 | Ht 69.0 in | Wt 269.0 lb

## 2022-08-25 DIAGNOSIS — Z6841 Body Mass Index (BMI) 40.0 and over, adult: Secondary | ICD-10-CM

## 2022-08-25 DIAGNOSIS — R011 Cardiac murmur, unspecified: Secondary | ICD-10-CM

## 2022-08-25 DIAGNOSIS — E119 Type 2 diabetes mellitus without complications: Secondary | ICD-10-CM

## 2022-08-25 DIAGNOSIS — D352 Benign neoplasm of pituitary gland: Secondary | ICD-10-CM | POA: Diagnosis not present

## 2022-08-25 DIAGNOSIS — E785 Hyperlipidemia, unspecified: Secondary | ICD-10-CM | POA: Diagnosis not present

## 2022-08-25 DIAGNOSIS — E1165 Type 2 diabetes mellitus with hyperglycemia: Secondary | ICD-10-CM

## 2022-08-25 DIAGNOSIS — R7309 Other abnormal glucose: Secondary | ICD-10-CM

## 2022-08-25 MED ORDER — ROSUVASTATIN CALCIUM 20 MG PO TABS: 20.0000 mg | ORAL_TABLET | Freq: Every day | ORAL | 3 refills | Status: DC

## 2022-08-25 MED ORDER — METFORMIN HCL ER 500 MG PO TB24: 1000.0000 mg | ORAL_TABLET | Freq: Two times a day (BID) | ORAL | 3 refills | Status: DC

## 2022-08-25 MED ORDER — DAPAGLIFLOZIN PROPANEDIOL 10 MG PO TABS: 10.0000 mg | ORAL_TABLET | Freq: Every day | ORAL | 3 refills | Status: DC

## 2022-08-25 NOTE — Patient Instructions (Signed)
-   Stop Lantus after hip surgery if your sugars remain less than 150  - Continue Metformin 500 mg TWO tablets Twice daily  - Continue  Farxiga 10 mg, 1 tablet daily in the morning     HOW TO TREAT LOW BLOOD SUGARS (Blood sugar LESS THAN 70 MG/DL) Please follow the RULE OF 15 for the treatment of hypoglycemia treatment (when your (blood sugars are less than 70 mg/dL)   STEP 1: Take 15 grams of carbohydrates when your blood sugar is low, which includes:  3-4 GLUCOSE TABS  OR 3-4 OZ OF JUICE OR REGULAR SODA OR ONE TUBE OF GLUCOSE GEL    STEP 2: RECHECK blood sugar in 15 MINUTES STEP 3: If your blood sugar is still low at the 15 minute recheck --> then, go back to STEP 1 and treat AGAIN with another 15 grams of carbohydrates.

## 2022-10-10 ENCOUNTER — Encounter (HOSPITAL_BASED_OUTPATIENT_CLINIC_OR_DEPARTMENT_OTHER): Payer: Self-pay

## 2022-10-10 ENCOUNTER — Emergency Department (HOSPITAL_BASED_OUTPATIENT_CLINIC_OR_DEPARTMENT_OTHER): Payer: 59

## 2022-10-10 ENCOUNTER — Emergency Department (HOSPITAL_BASED_OUTPATIENT_CLINIC_OR_DEPARTMENT_OTHER)
Admission: EM | Admit: 2022-10-10 | Discharge: 2022-10-10 | Disposition: A | Discharge status: Home / Self Care | Payer: 59 | Attending: Emergency Medicine | Admitting: Emergency Medicine

## 2022-10-10 ENCOUNTER — Other Ambulatory Visit: Payer: Self-pay

## 2022-10-10 DIAGNOSIS — Z96641 Presence of right artificial hip joint: Secondary | ICD-10-CM | POA: Insufficient documentation

## 2022-10-10 DIAGNOSIS — R2242 Localized swelling, mass and lump, left lower limb: Secondary | ICD-10-CM | POA: Insufficient documentation

## 2022-10-10 DIAGNOSIS — I10 Essential (primary) hypertension: Secondary | ICD-10-CM | POA: Insufficient documentation

## 2022-10-10 DIAGNOSIS — Z79899 Other long term (current) drug therapy: Secondary | ICD-10-CM | POA: Insufficient documentation

## 2022-10-10 DIAGNOSIS — Z87891 Personal history of nicotine dependence: Secondary | ICD-10-CM | POA: Insufficient documentation

## 2022-10-10 DIAGNOSIS — Z96642 Presence of left artificial hip joint: Secondary | ICD-10-CM | POA: Diagnosis not present

## 2022-10-10 LAB — CBC
HCT: 35.8 % — ABNORMAL LOW (ref 36.0–46.0)
Hemoglobin: 12 g/dL (ref 12.0–15.0)
MCH: 29.3 pg (ref 26.0–34.0)
MCHC: 33.5 g/dL (ref 30.0–36.0)
MCV: 87.3 fL (ref 80.0–100.0)
Platelets: 381 10*3/uL (ref 150–400)
RBC: 4.1 MIL/uL (ref 3.87–5.11)
RDW: 13.4 % (ref 11.5–15.5)
WBC: 6.7 10*3/uL (ref 4.0–10.5)
nRBC: 0 % (ref 0.0–0.2)

## 2022-10-10 LAB — BASIC METABOLIC PANEL
Anion gap: 9 (ref 5–15)
BUN: 16 mg/dL (ref 8–23)
CO2: 27 mmol/L (ref 22–32)
Calcium: 9.3 mg/dL (ref 8.9–10.3)
Chloride: 105 mmol/L (ref 98–111)
Creatinine, Ser: 0.56 mg/dL (ref 0.44–1.00)
GFR, Estimated: >60 mL/min (ref >60)
Glucose, Bld: 110 mg/dL — ABNORMAL HIGH (ref 70–99)
Potassium: 4 mmol/L (ref 3.5–5.1)
Sodium: 141 mmol/L (ref 135–145)

## 2022-10-10 NOTE — Discharge Instructions (Signed)
Note the workup today was overall negative for blood clot in the leg.  As discussed, swelling could be secondary to dependent edema from prior hip surgery.  Recommend compression stockings as well as elevation of affected leg above your heart.  If symptoms do not get better, repeat ultrasound may be warranted.  Please do not hesitate to return to emergency department if the worrisome signs and symptoms we discussed become apparent.

## 2022-10-10 NOTE — ED Provider Notes (Signed)
[facility] Provider Note   CSN: [id number] Arrival date & time: 10/10/22  1251     History  Chief Complaint  Patient presents with   Leg Swelling    Miranda Marquez is a 62 y.o. female.  HPI   62 year old female presents emergency department with complaints of left lower extremity swelling.  Patient with left total hip replacement with no complications.  Just recently saw surgeon this past Friday for said hip.  States that she noticed left lower extremity swelling over the past 2-3 days as well as bruising.  Denies any known trauma/injury.  Reports no leg swelling prior to symptom onset a few days ago.  Denies history of DVT/PE, current monotherapy.  Currently not on anticoagulation.  Denies any weakness or sensory deficits in affected leg, fever, chest pain, shortness of breath  Past medical history significant for left total hip arthroplasty, right total hip arthroplasty, obesity, hypertension, OSA  Home Medications Prior to Admission medications   Medication Sig Start Date End Date Taking? Authorizing Provider  BD PEN NEEDLE NANO U/F 32G X 4 MM MISC USE AS DIRECTED DAILY 03/01/22   Shamleffer, Melanie Crazier, MD  dapagliflozin propanediol (FARXIGA) 10 MG TABS tablet Take 1 tablet (10 mg total) by mouth daily before breakfast. 08/25/22   Shamleffer, Melanie Crazier, MD  diclofenac (VOLTAREN) 75 MG EC tablet Take 75 mg by mouth 2 (two) times daily. 07/05/22   [provider]  glucose blood (ONETOUCH VERIO) test strip Use as instructed Patient not taking: Reported on 08/25/2022 01/16/21   Shamleffer, Melanie Crazier, MD  insulin glargine (LANTUS SOLOSTAR) 100 UNIT/ML Solostar Pen Inject 10 Units into the skin daily. Patient taking differently: Inject 8 Units into the skin daily. 08/18/22   Shamleffer, Melanie Crazier, MD  latanoprost (XALATAN) 0.005 % ophthalmic solution Place 1 drop into both eyes at bedtime.    [provider]   metFORMIN (GLUCOPHAGE-XR) 500 MG 24 hr tablet Take 2 tablets (1,000 mg total) by mouth in the morning and at bedtime. 08/25/22   Shamleffer, Melanie Crazier, MD  rosuvastatin (CRESTOR) 20 MG tablet Take 1 tablet (20 mg total) by mouth daily. 08/25/22   Shamleffer, Melanie Crazier, MD  valsartan (DIOVAN) 320 MG tablet Take 1 tablet (320 mg total) by mouth daily. 07/22/22   Freada Bergeron, MD      Allergies    Patient has no known allergies.    Review of Systems   Review of Systems  All other systems reviewed and are negative.   Physical Exam Updated Vital Signs BP 133/75   Pulse 68   Temp 99 F (37.2 C) (Oral)   Resp 16   Ht 5' 8"$  (1.727 m)   Wt 117.9 kg   SpO2 97%   BMI 39.53 kg/m  Physical Exam Vitals and nursing note reviewed.  Constitutional:      General: She is not in acute distress.    Appearance: She is well-developed.  HENT:     Head: Normocephalic and atraumatic.  Eyes:     Conjunctiva/sclera: Conjunctivae normal.  Cardiovascular:     Rate and Rhythm: Normal rate and regular rhythm.     Heart sounds: No murmur heard. Pulmonary:     Effort: Pulmonary effort is normal. No respiratory distress.     Breath sounds: Normal breath sounds.  Abdominal:     Palpations: Abdomen is soft.     Tenderness: There is no abdominal tenderness.  Musculoskeletal:  General: No swelling.     Cervical back: Neck supple.     Right lower leg: No edema.     Left lower leg: Edema present.     Comments: Patient with yellow tent.  Prior bruising noted on anterior/lateral left lower leg from knee down to ankle.  Ecchymosis appreciated left lateral and posterior calf region.  Patient with full range of motion of bilateral hips, knees, ankles, digits.  Pedal pulses 2+ bilaterally.  No sensory deficits distally.  Skin:    General: Skin is warm and dry.     Capillary Refill: Capillary refill takes less than 2 seconds.  Neurological:     Mental Status: She is alert.  Psychiatric:         Mood and Affect: Mood normal.     ED Results / Procedures / Treatments   Labs (all labs ordered are listed, but only abnormal results are displayed) Labs Reviewed  BASIC METABOLIC PANEL - Abnormal; Notable for the following components:      Result Value   Glucose, Bld 110 (*)    All other components within normal limits  CBC - Abnormal; Notable for the following components:   HCT 35.8 (*)    All other components within normal limits    EKG None  Radiology US Venous Img Lower  Left (DVT Study)  Result Date: 10/10/2022 CLINICAL DATA:  Left lower leg bruising and mild swelling. Patient also complains of left anterior knee pain for 2 days. History of left hip arthroplasty 2 weeks ago. EXAM: LEFT LOWER EXTREMITY VENOUS DOPPLER ULTRASOUND TECHNIQUE: Gray-scale sonography with compression, as well as color and duplex ultrasound, were performed to evaluate the deep venous system(s) from the level of the common femoral vein through the popliteal and proximal calf veins. COMPARISON:  None Available. FINDINGS: VENOUS Normal compressibility of the common femoral, superficial femoral, and popliteal veins, as well as the visualized calf veins. Visualized portions of profunda femoral vein and great saphenous vein unremarkable. No filling defects to suggest DVT on grayscale or color Doppler imaging. Doppler waveforms show normal direction of venous flow, normal respiratory plasticity and response to augmentation. Limited views of the contralateral common femoral vein are unremarkable. OTHER Subcutaneous edema noted in the lateral left calf. Limitations: none IMPRESSION: 1. No evidence of left lower extremity DVT. 2. Subcutaneous edema noted in the left calf. Electronically Signed   By: Ileana Roup M.D.   On: 10/10/2022 15:37    Procedures Procedures    Medications Ordered in ED Medications - No data to display  ED Course/ Medical Decision Making/ A&P                             Medical  Decision Making Amount and/or Complexity of Data Reviewed Labs: ordered.   This patient presents to the ED for concern of leg swelling, this involves an extensive number of treatment options, and is a complaint that carries with it a high risk of complications and morbidity.  The differential diagnosis includes DVT, PAD, fracture, strain/sprain, dislocation, compartment syndrome, cellulitis, erysipelas, necrotizing fasciitis   Co morbidities that complicate the patient evaluation  See HPI   Additional history obtained:  Additional history obtained from EMR External records from outside source obtained and reviewed including hospital records   Lab Tests:  I Ordered, and personally interpreted labs.  The pertinent results include: No leukocytosis noted.  No evidence of anemia.  Platelets within range.  No electrolyte abnormalities appreciated.  No renal dysfunction.   Imaging Studies ordered:  I ordered imaging studies including left lower extremity ultrasound I independently visualized and interpreted imaging which showed no DVT I agree with the radiologist interpretation   Cardiac Monitoring: / EKG:  The patient was maintained on a cardiac monitor.  I personally viewed and interpreted the cardiac monitored which showed an underlying rhythm of: Sinus rhythm   Consultations Obtained:  N/a   Problem List / ED Course / Critical interventions / Medication management  Left lower extremity swelling. Reevaluation of the patient showed that the patient stayed the same I have reviewed the patients home medicines and have made adjustments as needed   Social Determinants of Health:  Former cigarette use.  Denies illicit drug use.   Test / Admission - Considered:  Left lower extremity swelling Vitals signs within normal range and stable throughout visit. Laboratory/imaging studies significant for: See above Patient with left lower extremity swelling without evidence of  DVT.  Most likely dependent swelling from prior left hip arthroplasty.  Recommended compression as well as elevation of affected leg.  If symptomatic therapy at home not improving swelling, advised patient to return for repeat DVT study for concern of blood clot given acute onset approximately 2 to 3 days ago and on initially after surgery.  No clinical indication for PAD, ischemic limb, compartment syndrome, infectious etiology.  Treatment plan discussed at length with patient and she acknowledged understanding was agreeable to said plan.  Worrisome signs and symptoms were discussed with the patient, and the patient acknowledged understanding to return to the ED if noticed. Patient was stable upon discharge.          Final Clinical Impression(s) / ED Diagnoses Final diagnoses:  Localized swelling of left lower extremity    Rx / DC Orders ED Discharge Orders     None         Wilnette Kales, [place] 10/10/22 1638    Charlesetta Shanks, MD 10/14/22 [phone]

## 2022-10-10 NOTE — ED Triage Notes (Signed)
She states that she had an anterior approach left total hip replacement ~2 weeks ago. Here today with concern of left lower leg swelling and lat. And post. Left calf ecchymosis. She denies fever, nor any other sign of current illness.

## 2022-11-29 ENCOUNTER — Ambulatory Visit: Payer: BC Managed Care – PPO | Admitting: Internal Medicine

## 2023-01-24 NOTE — Progress Notes (Deleted)
Cardiology Office Note:    Date:  01/24/2023   ID:  Miranda Marquez, DOB [birth year], MRN [id number]  PCP:  [name]   [facility] Providers Cardiologist:  Meriam Sprague, MD {  Referring MD: Annamaria Helling, MD    History of Present Illness:    Miranda Marquez is a 62 y.o. 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 Delton See in 2017 who now presents to clinic for follow-up.  Saw Dr. Delton See in 2017 where she was experiencing dyspnea on exertion. TTE with LVEF 55-60%, G1DD, mild ascending aortic aneurysm 38mm. Myoview negative for ischemia. She was lost to follow-up.  Returned to clinic in 12/2020 after being diagnosed with HLD and DMII. Specifically, her LDL cholesterol is  170, and her hemoglobin A1C is severely high at 12.4. Ca score 03/2021 242 which was 95% for age, gender, race matched controls. TTE 03/2021 with LVEF 60-65%, G1DD, GLS -21.3, moderate LAE, normal RV, trivial MR. We started her on crestor and metformin at that time.    Was last seen in clinic 06/2022 where she was doing well from a CV standpoint. Myoview 07/2022 is low risk with no ischemia/infarction.  Today, ***    Past Medical History:  Diagnosis Date   Arthritis    Cervical herniated disc    Complication of anesthesia    hypotension  in april after hysterectomy   Gestational diabetes    history..back 21 plus yrs ago.  Now she is ok   Hypertension    not taking meds    Pituitary microadenoma with hyperprolactinemia (HCC)    Psoriasis both elbows, scalp, left thigh, saw dr Terri Piedra, last week given cream for areas healing   Sleep apnea    never had study done, but things she does have it    Past Surgical History:  Procedure Laterality Date   ABDOMINAL HYSTERECTOMY     APPENDECTOMY     BREAST BIOPSY Right 10/11/2014   BREAST EXCISIONAL BIOPSY Right 07/2015   BREAST EXCISIONAL BIOPSY Right    BREAST LUMPECTOMY WITH  RADIOACTIVE SEED LOCALIZATION Right 08/07/2015   Procedure: RIGHT BREAST LUMPECTOMY WITH RADIOACTIVE SEED LOCALIZATION AND EXCISION OF BREAST MASS X2;  Surgeon: Abigail Miyamoto, MD;  Location: [facility] OR;  Service: General;  Laterality: Right;   BREAST SURGERY     DILATION AND CURETTAGE OF UTERUS     FOOT SURGERY     left heel     HERNIA REPAIR     umbilical   MAXIMUM ACCESS (MAS)POSTERIOR LUMBAR INTERBODY FUSION (PLIF) 1 LEVEL N/A 09/09/2016   Procedure: MAXIMUM ACCESS SURGERY POSTERIOR LUMBAR INTERBODY FUSION LUMBAR THREE-FOUR;  Surgeon: Tia Alert, MD;  Location: [facility] OR;  Service: Neurosurgery;  Laterality: N/A;   MINI-LAPAROTOMY W/ TUBAL LIGATION     ROBOTIC ASSISTED TOTAL HYSTERECTOMY WITH BILATERAL SALPINGO OOPHERECTOMY Bilateral 12/03/2014   Procedure: ROBOTIC ASSISTED TOTAL HYSTERECTOMY WITH BILATERAL SALPINGO OOPHORECTOMY;  Surgeon: Adolphus Birchwood, MD;  Location: [facility] ORS;  Service: Gynecology;  Laterality: Bilateral;   TOTAL HIP ARTHROPLASTY Right 10/18/2017   Procedure: RIGHT TOTAL HIP ARTHROPLASTY ANTERIOR APPROACH;  Surgeon: Durene Romans, MD;  Location: [facility] ORS;  Service: Orthopedics;  Laterality: Right;  70 mins   TUBAL LIGATION      Current Medications: No outpatient medications have been marked as taking for the 01/31/23 encounter (Appointment) with Meriam Sprague, MD.     Allergies:   Patient has  no known allergies.   Social History   Socioeconomic History   Marital status: Married    Spouse name: Not on file   Number of children: Not on file   Years of education: Not on file   Highest education level: Not on file  Occupational History   Not on file  Tobacco Use   Smoking status: Former    Types: Cigarettes    Quit date: 08/23/1978    Years since quitting: 44.4   Smokeless tobacco: Never  Vaping Use   Vaping Use: Never used  Substance and Sexual Activity   Alcohol use: Yes    Alcohol/week: 2.0 standard drinks of alcohol    Types: 2 Glasses of wine per week     Comment: occasional, weekends   Drug use: No   Sexual activity: Yes  Other Topics Concern   Not on file  Social History Narrative   Not on file   Social Determinants of Health   Financial Resource Strain: Not on file  Food Insecurity: Not on file  Transportation Needs: Not on file  Physical Activity: Not on file  Stress: Not on file  Social Connections: Not on file     Family History: The patient's family history includes Hypertension in her father.  ROS:   Please see the history of present illness.       EKGs/Labs/Other Studies Reviewed:    The following studies were reviewed today: Cardiac Studies & Procedures     STRESS TESTS  MYOCARDIAL PERFUSION IMAGING 08/13/2022  Narrative   Findings are consistent with no prior ischemia and no prior myocardial infarction. The study is low risk.   No ST deviation was noted.   LV perfusion is normal. There is no evidence of ischemia. There is no evidence of infarction.   Left ventricular function is normal. End diastolic cavity size is normal. End systolic cavity size is normal.   Prior study available for comparison from 05/14/2016.   ECHOCARDIOGRAM  ECHOCARDIOGRAM COMPLETE 03/26/2021  Narrative ECHOCARDIOGRAM REPORT    Patient Name:   Miranda Marquez  Date of Exam: 03/26/2021 Medical Rec #:  [id number]     Height:       69.0 in Accession #:    [id number]    Weight:       261.2 lb Date of Birth:  [birth year]      BSA:          2.314 m Patient Age:    60 years      BP:           140/82 mmHg Patient Gender: F             HR:           77 bpm. Exam Location:  [address]  Procedure: 2D Echo, 3D Echo, Cardiac Doppler, Color Doppler and Strain Analysis  Indications:    R01.1 Murmur  History:        Patient has prior history of Echocardiogram examinations, most recent 05/12/2016. Risk Factors:Hypertension.  Sonographer:    Daphine Deutscher RDCS Referring Phys: [id number] [name]  IMPRESSIONS   1. Prominent  pulmonary vein a wave reversal (elevated LVEDP with normal mean LA pressure). Left ventricular ejection fraction, by estimation, is 60 to 65%. Left ventricular ejection fraction by 3D volume is 62 %. The left ventricle has normal function. The left ventricle has no regional wall motion abnormalities. There is mild concentric left ventricular hypertrophy. Left ventricular diastolic parameters are consistent  with Grade I diastolic dysfunction (impaired relaxation). Elevated left ventricular end-diastolic pressure. The E/e' is 9.3. The average left ventricular global longitudinal strain is -21.3 %. The global longitudinal strain is normal. 2. Right ventricular systolic function is normal. The right ventricular size is normal. 3. Left atrial size was moderately dilated. 4. The mitral valve is normal in structure. Trivial mitral valve regurgitation. No evidence of mitral stenosis. 5. The aortic valve is normal in structure. Aortic valve regurgitation is not visualized. No aortic stenosis is present. 6. The inferior vena cava is normal in size with greater than 50% respiratory variability, suggesting right atrial pressure of 3 mmHg.  Comparison(s): Prior images unable to be directly viewed, comparison made by report only.  FINDINGS Left Ventricle: Prominent pulmonary vein a wave reversal (elevated LVEDP with normal mean LA pressure). Left ventricular ejection fraction, by estimation, is 60 to 65%. Left ventricular ejection fraction by 3D volume is 62 %. The left ventricle has normal function. The left ventricle has no regional wall motion abnormalities. The average left ventricular global longitudinal strain is -21.3 %. The global longitudinal strain is normal. The left ventricular internal cavity size was normal in size. There is mild concentric left ventricular hypertrophy. Left ventricular diastolic parameters are consistent with Grade I diastolic dysfunction (impaired relaxation). Elevated left  ventricular end-diastolic pressure. The E/e' is 9.3.  Right Ventricle: The right ventricular size is normal. No increase in right ventricular wall thickness. Right ventricular systolic function is normal.  Left Atrium: Left atrial size was moderately dilated.  Right Atrium: Right atrial size was normal in size.  Pericardium: There is no evidence of pericardial effusion.  Mitral Valve: The mitral valve is normal in structure. Trivial mitral valve regurgitation. No evidence of mitral valve stenosis.  Tricuspid Valve: The tricuspid valve is normal in structure. Tricuspid valve regurgitation is not demonstrated. No evidence of tricuspid stenosis.  Aortic Valve: The aortic valve is normal in structure. Aortic valve regurgitation is not visualized. No aortic stenosis is present.  Pulmonic Valve: The pulmonic valve was normal in structure. Pulmonic valve regurgitation is not visualized. No evidence of pulmonic stenosis.  Aorta: The aortic root is normal in size and structure.  Venous: The inferior vena cava is normal in size with greater than 50% respiratory variability, suggesting right atrial pressure of 3 mmHg.  IAS/Shunts: No atrial level shunt detected by color flow Doppler.   LEFT VENTRICLE PLAX 2D LVIDd:         4.90 cm         Diastology LVIDs:         3.50 cm         LV e' medial:    5.33 cm/s LV PW:         1.30 cm         LV E/e' medial:  9.4 LV IVS:        1.20 cm         LV e' lateral:   5.44 cm/s LVOT diam:     2.30 cm         LV E/e' lateral: 9.2 LV SV:         87 LV SV Index:   37              2D LVOT Area:     4.15 cm        Longitudinal Strain 2D Strain GLS  -20.6 % (A2C): 2D Strain GLS  -21.5 % (A3C): 2D Strain GLS  -  21.7 % (A4C): 2D Strain GLS  -21.3 % Avg:  3D Volume EF LV 3D EF:    Left ventricul ar ejection fraction by 3D volume is 62 %.  3D Volume EF: 3D EF:        62 % LV EDV:       112 ml LV ESV:       43 ml LV SV:        69 ml  RIGHT  VENTRICLE RV Basal diam:  3.20 cm RV S prime:     12.70 cm/s TAPSE (M-mode): 1.8 cm  LEFT ATRIUM             Index       RIGHT ATRIUM           Index LA diam:        4.79 cm 2.07 cm/m  RA Area:     12.90 cm LA Vol (A2C):   49.1 ml 21.21 ml/m RA Volume:   29.00 ml  12.53 ml/m LA Vol (A4C):   59.1 ml 25.54 ml/m LA Biplane Vol: 57.3 ml 24.76 ml/m AORTIC VALVE LVOT Vmax:   104.60 cm/s LVOT Vmean:  70.100 cm/s LVOT VTI:    0.209 m  AORTA Ao Root diam: 3.80 cm Ao Asc diam:  3.80 cm  MITRAL VALVE MV Area (PHT): 2.50 cm    SHUNTS MV Decel Time: 303 msec    Systemic VTI:  0.21 m MV E velocity: 50.10 cm/s  Systemic Diam: 2.30 cm MV A velocity: 75.80 cm/s MV E/A ratio:  0.66  Mihai Croitoru MD Electronically signed by Thurmon Fair MD Signature Date/Time: 03/26/2021/12:53:01 PM    Final     CT SCANS  CT CARDIAC SCORING (SELF PAY ONLY) 03/26/2021  Addendum 03/26/2021  2:08 PM ADDENDUM REPORT: 03/26/2021 14:05  EXAM: OVER-READ INTERPRETATION  CT CHEST  The following report is an over-read performed by radiologist Dr. Nadara Eaton [facility] on 03/26/2021. This over-read does not include interpretation of cardiac or coronary anatomy or pathology. The coronary calcium score interpretation by the cardiologist is attached.  COMPARISON:  None.  FINDINGS: Ascending thoracic aorta measures roughly 3.9 cm. Visualized mediastinal structures are normal. Images of upper abdomen are unremarkable. Visualized lungs are clear. No large pleural effusions. No acute bone abnormality.  IMPRESSION: No acute abnormality involving the extracardiac structures.   Electronically Signed By: Richarda Overlie M.D. On: 03/26/2021 14:05  Narrative CLINICAL DATA:  Cardiovascular Disease Risk stratification  EXAM: Coronary Calcium Score  TECHNIQUE: A gated, non-contrast computed tomography scan of the heart was performed using 3mm slice thickness. Axial images were analyzed on  a dedicated workstation. Calcium scoring of the coronary arteries was performed using the Agatston method.  FINDINGS: Coronary Calcium Score:  Left main: 0  Left anterior descending artery: 222  Left circumflex artery: 15  Right coronary artery: 5.4  Total: 242.4  Percentile: 95th  Pericardium: Normal.  Ascending Aorta: Normal caliber.  Non-cardiac: See separate report from [facility].  IMPRESSION: Coronary calcium score of 242.5. This was 95th percentile for age-, race-, and sex-matched controls.  RECOMMENDATIONS: Coronary artery calcium (CAC) score is a strong predictor of incident coronary heart disease (CHD) and provides predictive information beyond traditional risk factors. CAC scoring is reasonable to use in the decision to withhold, postpone, or initiate statin therapy in intermediate-risk or selected borderline-risk asymptomatic adults (age 91-75 years and LDL-C >=70 to <190 mg/dL) who do not have diabetes or established atherosclerotic cardiovascular disease (ASCVD).*  In intermediate-risk (10-year ASCVD risk >=7.5% to <20%) adults or selected borderline-risk (10-year ASCVD risk >=5% to <7.5%) adults in whom a CAC score is measured for the purpose of making a treatment decision the following recommendations have been made:  If CAC=0, it is reasonable to withhold statin therapy and reassess in 5 to 10 years, as long as higher risk conditions are absent (diabetes mellitus, family history of premature CHD in first degree relatives (males <55 years; females <65 years), cigarette smoking, or LDL >=190 mg/dL).  If CAC is 1 to 99, it is reasonable to initiate statin therapy for patients >=83 years of age.  If CAC is >=100 or >=75th percentile, it is reasonable to initiate statin therapy at any age.  Cardiology referral should be considered for patients with CAC scores >=400 or >=75th percentile.  *2018  AHA/ACC/AACVPR/AAPA/ABC/ACPM/ADA/AGS/APhA/ASPC/NLA/PCNA Guideline on the Management of Blood Cholesterol: A Report of the American College of Cardiology/American Heart Association Task Force on Clinical Practice Guidelines. J Am Coll Cardiol. 2019;73(24):3168-3209.  Lennie Odor, MD  Electronically Signed: By: Lennie Odor On: 03/26/2021 13:29            EKG:   ***  Recent Labs: 10/10/2022: BUN 16; Creatinine, Ser 0.56; Hemoglobin 12.0; Platelets 381; Potassium 4.0; Sodium 141  Recent Lipid Panel    Component Value Date/Time   CHOL 147 04/03/2021 1330   TRIG 106 04/03/2021 1330   HDL 64 04/03/2021 1330   CHOLHDL 2.3 04/03/2021 1330   LDLCALC 64 04/03/2021 1330     Risk Assessment/Calculations:       Physical Exam:    VS:  There were no vitals taken for this visit.    Wt Readings from Last 3 Encounters:  10/10/22 260 lb (117.9 kg)  08/25/22 269 lb (122 kg)  08/12/22 271 lb (122.9 kg)     GEN: Well nourished, well developed in no acute distress HEENT: Normal NECK: No JVD; No carotid bruits CARDIAC: RRR, 1/6 systolic murmur, no rubs, no gallops RESPIRATORY:  Clear to auscultation without rales, wheezing or rhonchi  ABDOMEN: Soft, non-tender, non-distended MUSCULOSKELETAL:  No edema; No deformity  SKIN: Warm and dry NEUROLOGIC:  Alert and oriented x 3 PSYCHIATRIC:  Normal affect   ASSESSMENT:    No diagnosis found.  PLAN:    In order of problems listed above:  #Coronary Artery Calcification: Ca score 242 (95%). Myoview 07/2022 (for pre-op evaluation) with normal perfusion. No anginal symptoms. Tolerating crestor. Discussed lifestyle modifications and prevention at length today. -Continue crestor 20mg  daily -Lifestyle modifications as below  #Hyperlipidemia: LDL improved to 64.  -Continue crestor 20mg  daily  #DMII: -Continue metformin 1000mg  BID -Continue insulin -Continue farxiga 10mg  daily -Did not tolerate ozempic  #HTN: Blood pressure  130s in office but measuring 160 on home cuff. She will purchase a new cuff and send new BP log as suspect her cuff is inaccurate. -Continue valsartan 320mg  daily  #Ascending Aortic Dilation: Normal on most recent TTE in 03/2021. No further monitoring needed.  #Morbid Obesity: BMI 40.  -Discussed healthy diet and lifestyle at length today  Exercise recommendations: Goal of exercising for at least 30 minutes a day, at least 5 times per week.  Please exercise to a moderate exertion.  This means that while exercising it is difficult to speak in full sentences, however you are not so short of breath that you feel you must stop, and not so comfortable that you can carry on a full conversation.  Exertion level should be approximately  a 5/10, if 10 is the most exertion you can perform.  Diet recommendations: Recommend a heart healthy diet such as the Mediterranean diet.  This diet consists of plant based foods, healthy fats, lean meats, olive oil.  It suggests limiting the intake of simple carbohydrates such as white breads, pastries, and pastas.  It also limits the amount of red meat, wine, and dairy products such as cheese that one should consume on a daily basis.    Medication Adjustments/Labs and Tests Ordered: Current medicines are reviewed at length with the patient today.  Concerns regarding medicines are outlined above.  No orders of the defined types were placed in this encounter.  No orders of the defined types were placed in this encounter.   There are no Patient Instructions on file for this visit.     Signed, Meriam Sprague, MD  01/24/2023 1:19 PM    [facility]

## 2023-01-31 ENCOUNTER — Ambulatory Visit: Payer: 59 | Admitting: Cardiology

## 2023-02-04 NOTE — Progress Notes (Unsigned)
Cardiology Office Note:    Date:  01/24/2023   ID:  Miranda Marquez, DOB 10/08/1960, MRN [id number]  PCP:  James, Natalie W, PA-C   [facility] Providers Cardiologist:  Lakelyn Straus E Amontae Ng, MD {  Referring MD: Wein, Robert, MD    History of Present Illness:    Miranda Marquez is a 62 y.o. 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 Nelson in 2017 who now presents to clinic for follow-up.  Saw Dr. Nelson in 2017 where she was experiencing dyspnea on exertion. TTE with LVEF 55-60%, G1DD, mild ascending aortic aneurysm 38mm. Myoview negative for ischemia. She was lost to follow-up.  Returned to clinic in 12/2020 after being diagnosed with HLD and DMII. Specifically, her LDL cholesterol is  170, and her hemoglobin A1C is severely high at 12.4. Ca score 03/2021 242 which was 95% for age, gender, race matched controls. TTE 03/2021 with LVEF 60-65%, G1DD, GLS -21.3, moderate LAE, normal RV, trivial MR. We started her on crestor and metformin at that time.    Was last seen in clinic 06/2022 where she was doing well from a CV standpoint. Myoview 07/2022 is low risk with no ischemia/infarction.  Today, ***    Past Medical History:  Diagnosis Date   Arthritis    Cervical herniated disc    Complication of anesthesia    hypotension  in april after hysterectomy   Gestational diabetes    history..back 21 plus yrs ago.  Now she is ok   Hypertension    not taking meds    Pituitary microadenoma with hyperprolactinemia (HCC)    Psoriasis both elbows, scalp, left thigh, saw dr lupton, last week given cream for areas healing   Sleep apnea    never had study done, but things she does have it    Past Surgical History:  Procedure Laterality Date   ABDOMINAL HYSTERECTOMY     APPENDECTOMY     BREAST BIOPSY Right 10/11/2014   BREAST EXCISIONAL BIOPSY Right 07/2015   BREAST EXCISIONAL BIOPSY Right    BREAST LUMPECTOMY WITH  RADIOACTIVE SEED LOCALIZATION Right 08/07/2015   Procedure: RIGHT BREAST LUMPECTOMY WITH RADIOACTIVE SEED LOCALIZATION AND EXCISION OF BREAST MASS X2;  Surgeon: Douglas Blackman, MD;  Location: [facility] OR;  Service: General;  Laterality: Right;   BREAST SURGERY     DILATION AND CURETTAGE OF UTERUS     FOOT SURGERY     left heel     HERNIA REPAIR     umbilical   MAXIMUM ACCESS (MAS)POSTERIOR LUMBAR INTERBODY FUSION (PLIF) 1 LEVEL N/A 09/09/2016   Procedure: MAXIMUM ACCESS SURGERY POSTERIOR LUMBAR INTERBODY FUSION LUMBAR THREE-FOUR;  Surgeon: David S Jones, MD;  Location: [facility] OR;  Service: Neurosurgery;  Laterality: N/A;   MINI-LAPAROTOMY W/ TUBAL LIGATION     ROBOTIC ASSISTED TOTAL HYSTERECTOMY WITH BILATERAL SALPINGO OOPHERECTOMY Bilateral 12/03/2014   Procedure: ROBOTIC ASSISTED TOTAL HYSTERECTOMY WITH BILATERAL SALPINGO OOPHORECTOMY;  Surgeon: Emma Rossi, MD;  Location: [facility];  Service: Gynecology;  Laterality: Bilateral;   TOTAL HIP ARTHROPLASTY Right 10/18/2017   Procedure: RIGHT TOTAL HIP ARTHROPLASTY ANTERIOR APPROACH;  Surgeon: Olin, Matthew, MD;  Location: [facility];  Service: Orthopedics;  Laterality: Right;  70 mins   TUBAL LIGATION      Current Medications: No outpatient medications have been marked as taking for the 01/31/23 encounter (Appointment) with Nygel Prokop E, MD.     Allergies:   Patient has   no known allergies.   Social History   Socioeconomic History   Marital status: Married    Spouse name: Not on file   Number of children: Not on file   Years of education: Not on file   Highest education level: Not on file  Occupational History   Not on file  Tobacco Use   Smoking status: Former    Types: Cigarettes    Quit date: 08/23/1978    Years since quitting: 44.4   Smokeless tobacco: Never  Vaping Use   Vaping Use: Never used  Substance and Sexual Activity   Alcohol use: Yes    Alcohol/week: 2.0 standard drinks of alcohol    Types: 2 Glasses of wine per week     Comment: occasional, weekends   Drug use: No   Sexual activity: Yes  Other Topics Concern   Not on file  Social History Narrative   Not on file   Social Determinants of Health   Financial Resource Strain: Not on file  Food Insecurity: Not on file  Transportation Needs: Not on file  Physical Activity: Not on file  Stress: Not on file  Social Connections: Not on file     Family History: The patient's family history includes Hypertension in her father.  ROS:   Please see the history of present illness.       EKGs/Labs/Other Studies Reviewed:    The following studies were reviewed today: Cardiac Studies & Procedures     STRESS TESTS  MYOCARDIAL PERFUSION IMAGING 08/13/2022  Narrative   Findings are consistent with no prior ischemia and no prior myocardial infarction. The study is low risk.   No ST deviation was noted.   LV perfusion is normal. There is no evidence of ischemia. There is no evidence of infarction.   Left ventricular function is normal. End diastolic cavity size is normal. End systolic cavity size is normal.   Prior study available for comparison from 05/14/2016.   ECHOCARDIOGRAM  ECHOCARDIOGRAM COMPLETE 03/26/2021  Narrative ECHOCARDIOGRAM REPORT    Patient Name:   Miranda Marquez  Date of Exam: 03/26/2021 Medical Rec #:  [id number]     Height:       69.0 in Accession #:    [id number]    Weight:       261.2 lb Date of Birth:  10/27/1960      BSA:          2.314 m Patient Age:    60 years      BP:           140/82 mmHg Patient Gender: F             HR:           77 bpm. Exam Location:  [address]  Procedure: 2D Echo, 3D Echo, Cardiac Doppler, Color Doppler and Strain Analysis  Indications:    R01.1 Murmur  History:        Patient has prior history of Echocardiogram examinations, most recent 05/12/2016. Risk Factors:Hypertension.  Sonographer:    NaTashia Rodgers-Jones RDCS Referring Phys: [id number] Eustacia Urbanek E Nariah Morgano  IMPRESSIONS   1. Prominent  pulmonary vein a wave reversal (elevated LVEDP with normal mean LA pressure). Left ventricular ejection fraction, by estimation, is 60 to 65%. Left ventricular ejection fraction by 3D volume is 62 %. The left ventricle has normal function. The left ventricle has no regional wall motion abnormalities. There is mild concentric left ventricular hypertrophy. Left ventricular diastolic parameters are consistent   with Grade I diastolic dysfunction (impaired relaxation). Elevated left ventricular end-diastolic pressure. The E/e' is 9.3. The average left ventricular global longitudinal strain is -21.3 %. The global longitudinal strain is normal. 2. Right ventricular systolic function is normal. The right ventricular size is normal. 3. Left atrial size was moderately dilated. 4. The mitral valve is normal in structure. Trivial mitral valve regurgitation. No evidence of mitral stenosis. 5. The aortic valve is normal in structure. Aortic valve regurgitation is not visualized. No aortic stenosis is present. 6. The inferior vena cava is normal in size with greater than 50% respiratory variability, suggesting right atrial pressure of 3 mmHg.  Comparison(s): Prior images unable to be directly viewed, comparison made by report only.  FINDINGS Left Ventricle: Prominent pulmonary vein a wave reversal (elevated LVEDP with normal mean LA pressure). Left ventricular ejection fraction, by estimation, is 60 to 65%. Left ventricular ejection fraction by 3D volume is 62 %. The left ventricle has normal function. The left ventricle has no regional wall motion abnormalities. The average left ventricular global longitudinal strain is -21.3 %. The global longitudinal strain is normal. The left ventricular internal cavity size was normal in size. There is mild concentric left ventricular hypertrophy. Left ventricular diastolic parameters are consistent with Grade I diastolic dysfunction (impaired relaxation). Elevated left  ventricular end-diastolic pressure. The E/e' is 9.3.  Right Ventricle: The right ventricular size is normal. No increase in right ventricular wall thickness. Right ventricular systolic function is normal.  Left Atrium: Left atrial size was moderately dilated.  Right Atrium: Right atrial size was normal in size.  Pericardium: There is no evidence of pericardial effusion.  Mitral Valve: The mitral valve is normal in structure. Trivial mitral valve regurgitation. No evidence of mitral valve stenosis.  Tricuspid Valve: The tricuspid valve is normal in structure. Tricuspid valve regurgitation is not demonstrated. No evidence of tricuspid stenosis.  Aortic Valve: The aortic valve is normal in structure. Aortic valve regurgitation is not visualized. No aortic stenosis is present.  Pulmonic Valve: The pulmonic valve was normal in structure. Pulmonic valve regurgitation is not visualized. No evidence of pulmonic stenosis.  Aorta: The aortic root is normal in size and structure.  Venous: The inferior vena cava is normal in size with greater than 50% respiratory variability, suggesting right atrial pressure of 3 mmHg.  IAS/Shunts: No atrial level shunt detected by color flow Doppler.   LEFT VENTRICLE PLAX 2D LVIDd:         4.90 cm         Diastology LVIDs:         3.50 cm         LV e' medial:    5.33 cm/s LV PW:         1.30 cm         LV E/e' medial:  9.4 LV IVS:        1.20 cm         LV e' lateral:   5.44 cm/s LVOT diam:     2.30 cm         LV E/e' lateral: 9.2 LV SV:         87 LV SV Index:   37              2D LVOT Area:     4.15 cm        Longitudinal Strain 2D Strain GLS  -20.6 % (A2C): 2D Strain GLS  -21.5 % (A3C): 2D Strain GLS  -  21.7 % (A4C): 2D Strain GLS  -21.3 % Avg:  3D Volume EF LV 3D EF:    Left ventricul ar ejection fraction by 3D volume is 62 %.  3D Volume EF: 3D EF:        62 % LV EDV:       112 ml LV ESV:       43 ml LV SV:        69 ml  RIGHT  VENTRICLE RV Basal diam:  3.20 cm RV S prime:     12.70 cm/s TAPSE (M-mode): 1.8 cm  LEFT ATRIUM             Index       RIGHT ATRIUM           Index LA diam:        4.79 cm 2.07 cm/m  RA Area:     12.90 cm LA Vol (A2C):   49.1 ml 21.21 ml/m RA Volume:   29.00 ml  12.53 ml/m LA Vol (A4C):   59.1 ml 25.54 ml/m LA Biplane Vol: 57.3 ml 24.76 ml/m AORTIC VALVE LVOT Vmax:   104.60 cm/s LVOT Vmean:  70.100 cm/s LVOT VTI:    0.209 m  AORTA Ao Root diam: 3.80 cm Ao Asc diam:  3.80 cm  MITRAL VALVE MV Area (PHT): 2.50 cm    SHUNTS MV Decel Time: 303 msec    Systemic VTI:  0.21 m MV E velocity: 50.10 cm/s  Systemic Diam: 2.30 cm MV A velocity: 75.80 cm/s MV E/A ratio:  0.66  Mihai Croitoru MD Electronically signed by Mihai Croitoru MD Signature Date/Time: 03/26/2021/12:53:01 PM    Final     CT SCANS  CT CARDIAC SCORING (SELF PAY ONLY) 03/26/2021  Addendum 03/26/2021  2:08 PM ADDENDUM REPORT: 03/26/2021 14:05  EXAM: OVER-READ INTERPRETATION  CT CHEST  The following report is an over-read performed by radiologist Dr. Adam Hennof [facility] on 03/26/2021. This over-read does not include interpretation of cardiac or coronary anatomy or pathology. The coronary calcium score interpretation by the cardiologist is attached.  COMPARISON:  None.  FINDINGS: Ascending thoracic aorta measures roughly 3.9 cm. Visualized mediastinal structures are normal. Images of upper abdomen are unremarkable. Visualized lungs are clear. No large pleural effusions. No acute bone abnormality.  IMPRESSION: No acute abnormality involving the extracardiac structures.   Electronically Signed By: Adam  Henn M.D. On: 03/26/2021 14:05  Narrative CLINICAL DATA:  Cardiovascular Disease Risk stratification  EXAM: Coronary Calcium Score  TECHNIQUE: A gated, non-contrast computed tomography scan of the heart was performed using 3mm slice thickness. Axial images were analyzed on  a dedicated workstation. Calcium scoring of the coronary arteries was performed using the Agatston method.  FINDINGS: Coronary Calcium Score:  Left main: 0  Left anterior descending artery: 222  Left circumflex artery: 15  Right coronary artery: 5.4  Total: 242.4  Percentile: 95th  Pericardium: Normal.  Ascending Aorta: Normal caliber.  Non-cardiac: See separate report from [facility].  IMPRESSION: Coronary calcium score of 242.5. This was 95th percentile for age-, race-, and sex-matched controls.  RECOMMENDATIONS: Coronary artery calcium (CAC) score is a strong predictor of incident coronary heart disease (CHD) and provides predictive information beyond traditional risk factors. CAC scoring is reasonable to use in the decision to withhold, postpone, or initiate statin therapy in intermediate-risk or selected borderline-risk asymptomatic adults (age 40-75 years and LDL-C >=70 to <190 mg/dL) who do not have diabetes or established atherosclerotic cardiovascular disease (ASCVD).*   In intermediate-risk (10-year ASCVD risk >=7.5% to <20%) adults or selected borderline-risk (10-year ASCVD risk >=5% to <7.5%) adults in whom a CAC score is measured for the purpose of making a treatment decision the following recommendations have been made:  If CAC=0, it is reasonable to withhold statin therapy and reassess in 5 to 10 years, as long as higher risk conditions are absent (diabetes mellitus, family history of premature CHD in first degree relatives (males <55 years; females <65 years), cigarette smoking, or LDL >=190 mg/dL).  If CAC is 1 to 99, it is reasonable to initiate statin therapy for patients >=55 years of age.  If CAC is >=100 or >=75th percentile, it is reasonable to initiate statin therapy at any age.  Cardiology referral should be considered for patients with CAC scores >=400 or >=75th percentile.  *2018  AHA/ACC/AACVPR/AAPA/ABC/ACPM/ADA/AGS/APhA/ASPC/NLA/PCNA Guideline on the Management of Blood Cholesterol: A Report of the American College of Cardiology/American Heart Association Task Force on Clinical Practice Guidelines. J Am Coll Cardiol. 2019;73(24):3168-3209.  Parnell O'Neal, MD  Electronically Signed: By: [place]  O'Neal On: 03/26/2021 13:29            EKG:   ***  Recent Labs: 10/10/2022: BUN 16; Creatinine, Ser 0.56; Hemoglobin 12.0; Platelets 381; Potassium 4.0; Sodium 141  Recent Lipid Panel    Component Value Date/Time   CHOL 147 04/03/2021 1330   TRIG 106 04/03/2021 1330   HDL 64 04/03/2021 1330   CHOLHDL 2.3 04/03/2021 1330   LDLCALC 64 04/03/2021 1330     Risk Assessment/Calculations:       Physical Exam:    VS:  There were no vitals taken for this visit.    Wt Readings from Last 3 Encounters:  10/10/22 260 lb (117.9 kg)  08/25/22 269 lb (122 kg)  08/12/22 271 lb (122.9 kg)     GEN: Well nourished, well developed in no acute distress HEENT: Normal NECK: No JVD; No carotid bruits CARDIAC: RRR, 1/6 systolic murmur, no rubs, no gallops RESPIRATORY:  Clear to auscultation without rales, wheezing or rhonchi  ABDOMEN: Soft, non-tender, non-distended MUSCULOSKELETAL:  No edema; No deformity  SKIN: Warm and dry NEUROLOGIC:  Alert and oriented x 3 PSYCHIATRIC:  Normal affect   ASSESSMENT:    No diagnosis found.  PLAN:    In order of problems listed above:  #Coronary Artery Calcification: Ca score 242 (95%). Myoview 07/2022 (for pre-op evaluation) with normal perfusion. No anginal symptoms. Tolerating crestor. Discussed lifestyle modifications and prevention at length today. -Continue crestor 20mg daily -Lifestyle modifications as below  #Hyperlipidemia: LDL improved to 64.  -Continue crestor 20mg daily  #DMII: -Continue metformin 1000mg BID -Continue insulin -Continue farxiga 10mg daily -Did not tolerate ozempic  #HTN: Blood pressure  130s in office but measuring 160 on home cuff. She will purchase a new cuff and send new BP log as suspect her cuff is inaccurate. -Continue valsartan 320mg daily  #Ascending Aortic Dilation: Normal on most recent TTE in 03/2021. No further monitoring needed.  #Morbid Obesity: BMI 40.  -Discussed healthy diet and lifestyle at length today  Exercise recommendations: Goal of exercising for at least 30 minutes a day, at least 5 times per week.  Please exercise to a moderate exertion.  This means that while exercising it is difficult to speak in full sentences, however you are not so short of breath that you feel you must stop, and not so comfortable that you can carry on a full conversation.  Exertion level should be approximately   a 5/10, if 10 is the most exertion you can perform.  Diet recommendations: Recommend a heart healthy diet such as the Mediterranean diet.  This diet consists of plant based foods, healthy fats, lean meats, olive oil.  It suggests limiting the intake of simple carbohydrates such as white breads, pastries, and pastas.  It also limits the amount of red meat, wine, and dairy products such as cheese that one should consume on a daily basis.    Medication Adjustments/Labs and Tests Ordered: Current medicines are reviewed at length with the patient today.  Concerns regarding medicines are outlined above.  No orders of the defined types were placed in this encounter.  No orders of the defined types were placed in this encounter.   There are no Patient Instructions on file for this visit.     Signed, Zennie Ayars E Tamsin Nader, MD  01/24/2023 1:19 PM    [facility] 

## 2023-02-08 ENCOUNTER — Encounter: Payer: Self-pay | Admitting: Cardiology

## 2023-02-08 ENCOUNTER — Ambulatory Visit: Payer: 59 | Attending: Cardiology | Admitting: Cardiology

## 2023-02-08 VITALS — BP 156/92 | HR 87 | Ht 68.0 in | Wt 266.8 lb

## 2023-02-08 DIAGNOSIS — Z7984 Long term (current) use of oral hypoglycemic drugs: Secondary | ICD-10-CM

## 2023-02-08 DIAGNOSIS — I10 Essential (primary) hypertension: Secondary | ICD-10-CM | POA: Diagnosis not present

## 2023-02-08 DIAGNOSIS — E119 Type 2 diabetes mellitus without complications: Secondary | ICD-10-CM | POA: Diagnosis not present

## 2023-02-08 DIAGNOSIS — Z79899 Other long term (current) drug therapy: Secondary | ICD-10-CM | POA: Diagnosis not present

## 2023-02-08 DIAGNOSIS — Z794 Long term (current) use of insulin: Secondary | ICD-10-CM

## 2023-02-08 DIAGNOSIS — E785 Hyperlipidemia, unspecified: Secondary | ICD-10-CM

## 2023-02-08 DIAGNOSIS — I2584 Coronary atherosclerosis due to calcified coronary lesion: Secondary | ICD-10-CM

## 2023-02-08 DIAGNOSIS — I2510 Atherosclerotic heart disease of native coronary artery without angina pectoris: Secondary | ICD-10-CM

## 2023-02-08 DIAGNOSIS — E7800 Pure hypercholesterolemia, unspecified: Secondary | ICD-10-CM

## 2023-02-08 MED ORDER — HYDROCHLOROTHIAZIDE 25 MG PO TABS: 25.0000 mg | ORAL_TABLET | Freq: Every day | ORAL | 2 refills | Status: DC

## 2023-02-08 NOTE — Patient Instructions (Signed)
Medication Instructions:   START TAKING HYDROCHLOROTHIAZIDE 25 MG BY MOUTH DAILY  *If you need a refill on your cardiac medications before your next appointment, please call your pharmacy*   Lab Work:  ONE WEEK HERE IN THE OFFICE--BMET  If you have labs (blood work) drawn today and your tests are completely normal, you will receive your results only by: MyChart Message (if you have MyChart) OR A paper copy in the mail If you have any lab test that is abnormal or we need to change your treatment, we will call you to review the results.    Follow-Up: At [facility], you and your health needs are our priority.  As part of our continuing mission to provide you with exceptional heart care, we have created designated Provider Care Teams.  These Care Teams include your primary Cardiologist (physician) and Advanced Practice Providers (APPs -  Physician Assistants and Nurse Practitioners) who all work together to provide you with the care you need, when you need it.  We recommend signing up for the patient portal called "MyChart".  Sign up information is provided on this After Visit Summary.  MyChart is used to connect with patients for Virtual Visits (Telemedicine).  Patients are able to view lab/test results, encounter notes, upcoming appointments, etc.  Non-urgent messages can be sent to your provider as well.   To learn more about what you can do with MyChart, go to [email or web address].    Your next appointment:   6 month(s)  Provider:   Jodelle Red, MD

## 2023-02-17 ENCOUNTER — Ambulatory Visit: Payer: 59 | Attending: Cardiology

## 2023-02-17 DIAGNOSIS — E119 Type 2 diabetes mellitus without complications: Secondary | ICD-10-CM

## 2023-02-17 DIAGNOSIS — Z79899 Other long term (current) drug therapy: Secondary | ICD-10-CM

## 2023-02-17 DIAGNOSIS — I10 Essential (primary) hypertension: Secondary | ICD-10-CM

## 2023-02-18 LAB — BASIC METABOLIC PANEL
BUN/Creatinine Ratio: 21 (ref 12–28)
BUN: 16 mg/dL (ref 8–27)
CO2: 24 mmol/L (ref 20–29)
Calcium: 9.7 mg/dL (ref 8.7–10.3)
Chloride: 100 mmol/L (ref 96–106)
Creatinine, Ser: 0.76 mg/dL (ref 0.57–1.00)
Glucose: 114 mg/dL — ABNORMAL HIGH (ref 70–99)
Potassium: 4.6 mmol/L (ref 3.5–5.2)
Sodium: 141 mmol/L (ref 134–144)
eGFR: 88 mL/min/{1.73_m2} (ref >59)

## 2023-02-28 ENCOUNTER — Encounter: Payer: Self-pay | Admitting: Internal Medicine

## 2023-02-28 ENCOUNTER — Ambulatory Visit (INDEPENDENT_AMBULATORY_CARE_PROVIDER_SITE_OTHER): Payer: 59 | Admitting: Internal Medicine

## 2023-02-28 VITALS — BP 124/80 | HR 83 | Ht 68.0 in | Wt 265.0 lb

## 2023-02-28 DIAGNOSIS — E785 Hyperlipidemia, unspecified: Secondary | ICD-10-CM

## 2023-02-28 DIAGNOSIS — E119 Type 2 diabetes mellitus without complications: Secondary | ICD-10-CM | POA: Diagnosis not present

## 2023-02-28 LAB — POCT GLUCOSE (DEVICE FOR HOME USE): POC Glucose: 149 mg/dl — AB (ref 70–99)

## 2023-02-28 LAB — POCT GLYCOSYLATED HEMOGLOBIN (HGB A1C): Hemoglobin A1C: 7 % — AB (ref 4.0–5.6)

## 2023-02-28 MED ORDER — TIRZEPATIDE 2.5 MG/0.5ML ~~LOC~~ SOAJ: 2.5000 mg | SUBCUTANEOUS | 3 refills | Status: DC

## 2023-02-28 NOTE — Progress Notes (Signed)
Name: Miranda Marquez  Age/ Sex: 62 y.o., female   MRN/ DOB: [id number], 11/07/1960     PCP: [name]   Reason for Endocrinology Evaluation: Type 2 Diabetes Mellitus  Initial Endocrine Consultative Visit: 01/16/2021    PATIENT IDENTIFIER: Miranda Marquez is a 62 y.o. female with a past medical history of HTN, T2DM, HTN and dyslipidemia. The patient has followed with Endocrinology clinic since 01/16/2021 for consultative assistance with management of her diabetes.  DIABETIC HISTORY:  Miranda Marquez was diagnosed with DM 07/2020.  She had gestations diabetes > 25 yrs ago. Her hemoglobin A1c has ranged from 10.2% in 2022, peaking at 12.4% in 2021  On her initial visit to our clinic she had an A1c of 10.7% she was not on any medications, we started metformin and insulin   By 04/2021 [name] started    Intolerant to Ozempic due to GI issues 2023, she was started by cardiology     PITUITARY HISTORY: She has a diagnosis of pituitary microadenoma in 2006. With an MRI showing a 5 mm adenoma . She has a Hx of hyperprolactinemia . She was on Bromocriptine until 2021 . Repeat Prolactin was normal x 2 in 2022 and we opted to hold off on Bromocriptine.      SUBJECTIVE:   During the last visit (08/25/2022): A1c 6.2%     Today (02/28/2023): Miranda Marquez  is here for a follow up on diabetes management. She checks her blood sugars 2 times daily. The patient has not had hypoglycemic episodes since the last clinic visit.  She had a follow-up with cardiology 01/2023 for coronary artery calcifications and HTN, on Crestor  She is dizzy with diuretic use  Denies new onset headaches Denies galactorrhea   HOME DIABETES REGIMEN:  Metformin 500 mg XR 2 tabs BID  Farxiga 10 mg daily  Lantus 8 units daily      Statin: yes ACE-I/ARB: no Prior Diabetic Education: no   GLUCOSE LOG:  n/a Memory recall 130's   DIABETIC COMPLICATIONS: Microvascular complications:   Denies: CKD, neuropathy,  retinopathy Last Eye Exam: scheduled in 03/2023   Macrovascular complications:   Denies: CAD, CVA, PVD   HISTORY:  Past Medical History:  Past Medical History:  Diagnosis Date   Arthritis    Cervical herniated disc    Complication of anesthesia    hypotension  in april after hysterectomy   Gestational diabetes    history..back 21 plus yrs ago.  Now she is ok   Hypertension    not taking meds    Pituitary microadenoma with hyperprolactinemia (HCC)    Psoriasis both elbows, scalp, left thigh, saw dr Terri Piedra, last week given cream for areas healing   Sleep apnea    never had study done, but things she does have it   Past Surgical History:  Past Surgical History:  Procedure Laterality Date   ABDOMINAL HYSTERECTOMY     APPENDECTOMY     BREAST BIOPSY Right 10/11/2014   BREAST EXCISIONAL BIOPSY Right 07/2015   BREAST EXCISIONAL BIOPSY Right    BREAST LUMPECTOMY WITH RADIOACTIVE SEED LOCALIZATION Right 08/07/2015   Procedure: RIGHT BREAST LUMPECTOMY WITH RADIOACTIVE SEED LOCALIZATION AND EXCISION OF BREAST MASS X2;  Surgeon: Abigail Miyamoto, MD;  Location: [facility] OR;  Service: General;  Laterality: Right;   BREAST SURGERY     DILATION AND CURETTAGE OF UTERUS     FOOT SURGERY     left heel     HERNIA REPAIR  umbilical   MAXIMUM ACCESS (MAS)POSTERIOR LUMBAR INTERBODY FUSION (PLIF) 1 LEVEL N/A 09/09/2016   Procedure: MAXIMUM ACCESS SURGERY POSTERIOR LUMBAR INTERBODY FUSION LUMBAR THREE-FOUR;  Surgeon: Tia Alert, MD;  Location: [facility] OR;  Service: Neurosurgery;  Laterality: N/A;   MINI-LAPAROTOMY W/ TUBAL LIGATION     ROBOTIC ASSISTED TOTAL HYSTERECTOMY WITH BILATERAL SALPINGO OOPHERECTOMY Bilateral 12/03/2014   Procedure: ROBOTIC ASSISTED TOTAL HYSTERECTOMY WITH BILATERAL SALPINGO OOPHORECTOMY;  Surgeon: Adolphus Birchwood, MD;  Location: [facility] ORS;  Service: Gynecology;  Laterality: Bilateral;   TOTAL HIP ARTHROPLASTY Right 10/18/2017   Procedure: RIGHT TOTAL HIP ARTHROPLASTY ANTERIOR  APPROACH;  Surgeon: Durene Romans, MD;  Location: [facility] ORS;  Service: Orthopedics;  Laterality: Right;  70 mins   TUBAL LIGATION     Social History:  reports that she quit smoking about 44 years ago. Her smoking use included cigarettes. She has never used smokeless tobacco. She reports current alcohol use of about 2.0 standard drinks of alcohol per week. She reports that she does not use drugs. Family History:  Family History  Problem Relation Age of Onset   Hypertension Father      HOME MEDICATIONS: Allergies as of 02/28/2023   No Known Allergies      Medication List        Accurate as of February 28, 2023 11:26 AM. If you have any questions, ask your nurse or doctor.          BD Pen Needle Nano U/F 32G X 4 MM Misc Generic drug: Insulin Pen Needle USE AS DIRECTED DAILY   dapagliflozin propanediol 10 MG Tabs tablet Commonly known as: Farxiga Take 1 tablet (10 mg total) by mouth daily before breakfast.   diclofenac 75 MG EC tablet Commonly known as: VOLTAREN Take 75 mg by mouth 2 (two) times daily.   hydrochlorothiazide 25 MG tablet Commonly known as: HYDRODIURIL Take 1 tablet (25 mg total) by mouth daily.   Lantus SoloStar 100 UNIT/ML Solostar Pen Generic drug: insulin glargine Inject 10 Units into the skin daily. What changed: how much to take   latanoprost 0.005 % ophthalmic solution Commonly known as: XALATAN Place 1 drop into both eyes at bedtime.   metFORMIN 500 MG 24 hr tablet Commonly known as: GLUCOPHAGE-XR Take 2 tablets (1,000 mg total) by mouth in the morning and at bedtime.   OneTouch Verio test strip Generic drug: glucose blood Use as instructed   rosuvastatin 20 MG tablet Commonly known as: CRESTOR Take 1 tablet (20 mg total) by mouth daily.   valsartan 320 MG tablet Commonly known as: DIOVAN Take 1 tablet (320 mg total) by mouth daily.         OBJECTIVE:   Vital Signs: BP 124/80 (BP Location: Left Arm, Patient Position: Sitting, Cuff  Size: Large)   Pulse 83   Ht 5\' 8"  (1.727 m)   Wt 265 lb (120.2 kg)   SpO2 97%   BMI 40.29 kg/m   Wt Readings from Last 3 Encounters:  02/28/23 265 lb (120.2 kg)  02/08/23 266 lb 12.8 oz (121 kg)  10/10/22 260 lb (117.9 kg)     Exam: General: Pt appears well and is in NAD  Lungs: Clear with good BS bilat   Heart: RRR   Extremities: No pretibial edema.   Neuro: MS is good with appropriate affect, pt is alert and Ox3    DM foot exam: 02/28/2023     The skin of the feet is intact without sores or ulcerations. The pedal pulses  are 2+ on right and 2+ on left. The sensation is intact to a screening 5.07, 10 gram monofilament bilaterally      DATA REVIEWED:  Lab Results  Component Value Date   HGBA1C 6.6 (A) 03/09/2022   HGBA1C 6.5 (A) 09/09/2021   HGBA1C 6.6 (A) 04/23/2021       Latest Reference Range & Units 03/09/22 12:10  Prolactin ng/mL 16.8    Latest Reference Range & Units 02/17/23 12:40  Sodium 134 - 144 mmol/L 141  Potassium 3.5 - 5.2 mmol/L 4.6  Chloride 96 - 106 mmol/L 100  CO2 20 - 29 mmol/L 24  Glucose 70 - 99 mg/dL 161 (H)  BUN 8 - 27 mg/dL 16  Creatinine 0.96 - 0.45 mg/dL 4.09  Calcium 8.7 - 81.1 mg/dL 9.7  BUN/Creatinine Ratio 12 - 28  21  eGFR >59 mL/min/1.73 88  (H): Data is abnormally high     BRAIN MRI 03/23/2005   1. Suspect 5 mm prolactinoma in the right inferior aspect of the gland with mild remodeling of the sellar floor as well as mild deviation of the pituitary stalk from right to left.  2. Otherwise negative cranial MRI  ASSESSMENT / PLAN / RECOMMENDATIONS:   1) Type 2 Diabetes Mellitus, optimally controlled, With out complications - Most recent A1c of 7.0 %. Goal A1c < 7.0 %.    - A1c remains at goal but it has trended up -She could not tolerate Ozempic due to bloating and severe constipation, I have recommended trying Mounjaro, we discussed risk of GI side effects as well, but we also discussed weight, glycemic, and cardiac  benefits - Continue Farxiga and Metformin  -We discussed that the purpose of Greggory Keen is to hopefully be able to wean her off insulin down the road  MEDICATIONS: Continue metformin 500 mg 2 tablets BID Continue  Farxiga 10 mg, 1 tablet daily in the morning Continue Lantus 8 units daily Start Mounjaro 2.5 mg weekly  EDUCATION / INSTRUCTIONS: BG monitoring instructions: Patient is instructed to check her blood sugars 1 times a day, Fasting Call  Endocrinology clinic if: BG persistently < 70  I reviewed the Rule of 15 for the treatment of hypoglycemia in detail with the patient. Literature supplied.   2) Diabetic complications:  Eye: Does not have known diabetic retinopathy.  Neuro/ Feet: Does not have known diabetic peripheral neuropathy .  Renal: Patient does not have known baseline CKD. She   is not on an ACEI/ARB at present.    3) Dyslipidemia:    - She was started on Statin therapy in 12/2020 -She is tolerating this without side effects   Continue Rosuvastatin 20 mg daily        4) Hx Pituitary Microadenoma:    - She was on Bromocriptine for 3 decades, Has been off since 2021 - MRI from 2006 showed a 6 mm adenoma  -All pituitary hormones have been normal in May 2022    I spent 26 minutes preparing to see the patient by review of recent labs, imaging and procedures, obtaining and reviewing separately obtained history, communicating with the patient, ordering medications, tests or procedures, and documenting clinical information in the EHR including the differential Dx, treatment, and any further evaluation and other management     F/U in 4 months   Signed electronically by: Lyndle Herrlich, MD  [facility]  [facility] [address] [place] [place], [place] [address] Phone: [phone] FAX: [phone]   CC:  [name], PA-C [id number] [place] [address] Phone: [phone]  Fax: [phone]  Return  to Endocrinology clinic as below: Future Appointments  Date Time Provider Department Center  08/05/2023 10:40 AM Jodelle Red, MD [facility]

## 2023-02-28 NOTE — Patient Instructions (Addendum)
-  Start Mounjaro 2.5 mg weekly -Continue Lantus 8 units daily ( Stop if sugar goes to 70's or less) - Continue Metformin 500 mg TWO tablets Twice daily  - Continue  Farxiga 10 mg, 1 tablet daily in the morning     HOW TO TREAT LOW BLOOD SUGARS (Blood sugar LESS THAN 70 MG/DL) Please follow the RULE OF 15 for the treatment of hypoglycemia treatment (when your (blood sugars are less than 70 mg/dL)   STEP 1: Take 15 grams of carbohydrates when your blood sugar is low, which includes:  3-4 GLUCOSE TABS  OR 3-4 OZ OF JUICE OR REGULAR SODA OR ONE TUBE OF GLUCOSE GEL    STEP 2: RECHECK blood sugar in 15 MINUTES STEP 3: If your blood sugar is still low at the 15 minute recheck --> then, go back to STEP 1 and treat AGAIN with another 15 grams of carbohydrates.

## 2023-03-11 ENCOUNTER — Telehealth: Payer: Self-pay

## 2023-03-11 ENCOUNTER — Other Ambulatory Visit (HOSPITAL_COMMUNITY): Payer: Self-pay

## 2023-03-11 NOTE — Telephone Encounter (Signed)
Pharmacy Patient Advocate Encounter   Received notification from CoverMyMeds that prior authorization for [facility] is required/requested.   Per test claim: PA submitted to [employer] via CoverMyMeds Key/confirmation #/EOC [id number] Status is pending

## 2023-03-15 NOTE — Telephone Encounter (Signed)
Pharmacy Patient Advocate Encounter  Received notification from  [employer]  that Prior Authorization for Miranda Marquez has been Approved through 03/22/2028

## 2023-03-16 ENCOUNTER — Other Ambulatory Visit: Payer: Self-pay | Admitting: Internal Medicine

## 2023-03-16 DIAGNOSIS — E1165 Type 2 diabetes mellitus with hyperglycemia: Secondary | ICD-10-CM

## 2023-05-31 ENCOUNTER — Other Ambulatory Visit: Payer: Self-pay | Admitting: Obstetrics & Gynecology

## 2023-05-31 DIAGNOSIS — Z1231 Encounter for screening mammogram for malignant neoplasm of breast: Secondary | ICD-10-CM

## 2023-06-30 ENCOUNTER — Ambulatory Visit
Admission: RE | Admit: 2023-06-30 | Discharge: 2023-06-30 | Disposition: A | Discharge status: Home / Self Care | Payer: 59 | Source: Ambulatory Visit | Attending: Obstetrics & Gynecology | Admitting: Obstetrics & Gynecology

## 2023-06-30 DIAGNOSIS — Z1231 Encounter for screening mammogram for malignant neoplasm of breast: Secondary | ICD-10-CM

## 2023-07-04 ENCOUNTER — Encounter: Payer: Self-pay | Admitting: Obstetrics & Gynecology

## 2023-07-04 ENCOUNTER — Other Ambulatory Visit: Payer: Self-pay | Admitting: Obstetrics & Gynecology

## 2023-07-04 DIAGNOSIS — R928 Other abnormal and inconclusive findings on diagnostic imaging of breast: Secondary | ICD-10-CM

## 2023-07-19 ENCOUNTER — Ambulatory Visit
Admission: RE | Admit: 2023-07-19 | Discharge: 2023-07-19 | Disposition: A | Discharge status: Home / Self Care | Payer: 59 | Source: Ambulatory Visit | Attending: Obstetrics & Gynecology | Admitting: Obstetrics & Gynecology

## 2023-07-19 DIAGNOSIS — R928 Other abnormal and inconclusive findings on diagnostic imaging of breast: Secondary | ICD-10-CM

## 2023-07-20 ENCOUNTER — Other Ambulatory Visit: Payer: Self-pay | Admitting: Obstetrics & Gynecology

## 2023-07-20 DIAGNOSIS — R921 Mammographic calcification found on diagnostic imaging of breast: Secondary | ICD-10-CM

## 2023-07-26 ENCOUNTER — Ambulatory Visit
Admission: RE | Admit: 2023-07-26 | Discharge: 2023-07-26 | Disposition: A | Discharge status: Home / Self Care | Payer: 59 | Source: Ambulatory Visit | Attending: Obstetrics & Gynecology | Admitting: Obstetrics & Gynecology

## 2023-07-26 DIAGNOSIS — R921 Mammographic calcification found on diagnostic imaging of breast: Secondary | ICD-10-CM

## 2023-07-26 HISTORY — PX: BREAST BIOPSY: SHX20

## 2023-07-27 LAB — SURGICAL PATHOLOGY

## 2023-07-28 ENCOUNTER — Telehealth: Payer: Self-pay | Admitting: Hematology and Oncology

## 2023-07-28 NOTE — Telephone Encounter (Signed)
Spoke to patient to confirm upcoming afternoon [facility] clinic appointment on 12/11, paperwork will be sent via mail and e-mail   Gave location and time, also informed patient that the surgeon's office would be calling as well to get information from them similar to the packet that they will be receiving so make sure to do both.  Reminded patient that all providers will be coming to the clinic to see them HERE and if they had any questions to not hesitate to reach back out to myself or their navigators.

## 2023-08-01 ENCOUNTER — Encounter: Payer: Self-pay | Admitting: *Deleted

## 2023-08-01 DIAGNOSIS — D0511 Intraductal carcinoma in situ of right breast: Secondary | ICD-10-CM | POA: Insufficient documentation

## 2023-08-03 ENCOUNTER — Inpatient Hospital Stay: Payer: 59 | Attending: Hematology and Oncology

## 2023-08-03 ENCOUNTER — Inpatient Hospital Stay (HOSPITAL_BASED_OUTPATIENT_CLINIC_OR_DEPARTMENT_OTHER): Payer: 59 | Admitting: Genetic Counselor

## 2023-08-03 ENCOUNTER — Inpatient Hospital Stay (HOSPITAL_BASED_OUTPATIENT_CLINIC_OR_DEPARTMENT_OTHER): Payer: 59 | Admitting: Hematology and Oncology

## 2023-08-03 ENCOUNTER — Ambulatory Visit
Admission: RE | Admit: 2023-08-03 | Discharge: 2023-08-03 | Disposition: A | Discharge status: Home / Self Care | Payer: 59 | Source: Ambulatory Visit | Attending: Radiation Oncology | Admitting: Radiation Oncology

## 2023-08-03 ENCOUNTER — Encounter: Payer: Self-pay | Admitting: Genetic Counselor

## 2023-08-03 ENCOUNTER — Ambulatory Visit: Payer: Self-pay | Admitting: Surgery

## 2023-08-03 ENCOUNTER — Encounter: Payer: Self-pay | Admitting: General Practice

## 2023-08-03 ENCOUNTER — Ambulatory Visit: Payer: 59 | Admitting: Physical Therapy

## 2023-08-03 ENCOUNTER — Encounter: Payer: Self-pay | Admitting: Hematology and Oncology

## 2023-08-03 VITALS — BP 145/59 | HR 75 | Temp 98.1°F | Resp 18 | Ht 68.0 in | Wt 267.9 lb

## 2023-08-03 DIAGNOSIS — Z803 Family history of malignant neoplasm of breast: Secondary | ICD-10-CM

## 2023-08-03 DIAGNOSIS — Z801 Family history of malignant neoplasm of trachea, bronchus and lung: Secondary | ICD-10-CM

## 2023-08-03 DIAGNOSIS — D352 Benign neoplasm of pituitary gland: Secondary | ICD-10-CM | POA: Insufficient documentation

## 2023-08-03 DIAGNOSIS — Z87891 Personal history of nicotine dependence: Secondary | ICD-10-CM | POA: Insufficient documentation

## 2023-08-03 DIAGNOSIS — D0511 Intraductal carcinoma in situ of right breast: Secondary | ICD-10-CM | POA: Diagnosis present

## 2023-08-03 DIAGNOSIS — Z8042 Family history of malignant neoplasm of prostate: Secondary | ICD-10-CM | POA: Insufficient documentation

## 2023-08-03 DIAGNOSIS — Z808 Family history of malignant neoplasm of other organs or systems: Secondary | ICD-10-CM | POA: Diagnosis not present

## 2023-08-03 LAB — CMP (CANCER CENTER ONLY)
ALT: 21 U/L (ref 0–44)
AST: 16 U/L (ref 15–41)
Albumin: 4.5 g/dL (ref 3.5–5.0)
Alkaline Phosphatase: 62 U/L (ref 38–126)
Anion gap: 9 (ref 5–15)
BUN: 31 mg/dL — ABNORMAL HIGH (ref 8–23)
CO2: 27 mmol/L (ref 22–32)
Calcium: 9.7 mg/dL (ref 8.9–10.3)
Chloride: 102 mmol/L (ref 98–111)
Creatinine: 0.99 mg/dL (ref 0.44–1.00)
GFR, Estimated: >60 mL/min (ref >60)
Glucose, Bld: 113 mg/dL — ABNORMAL HIGH (ref 70–99)
Potassium: 3.6 mmol/L (ref 3.5–5.1)
Sodium: 138 mmol/L (ref 135–145)
Total Bilirubin: 0.7 mg/dL (ref <1.2)
Total Protein: 7.2 g/dL (ref 6.5–8.1)

## 2023-08-03 LAB — CBC WITH DIFFERENTIAL (CANCER CENTER ONLY)
Abs Immature Granulocytes: 0.01 10*3/uL (ref 0.00–0.07)
Basophils Absolute: 0 10*3/uL (ref 0.0–0.1)
Basophils Relative: 1 %
Eosinophils Absolute: 0.1 10*3/uL (ref 0.0–0.5)
Eosinophils Relative: 3 %
HCT: 40.4 % (ref 36.0–46.0)
Hemoglobin: 13.9 g/dL (ref 12.0–15.0)
Immature Granulocytes: 0 %
Lymphocytes Relative: 31 %
Lymphs Abs: 1.4 10*3/uL (ref 0.7–4.0)
MCH: 30 pg (ref 26.0–34.0)
MCHC: 34.4 g/dL (ref 30.0–36.0)
MCV: 87.1 fL (ref 80.0–100.0)
Monocytes Absolute: 0.4 10*3/uL (ref 0.1–1.0)
Monocytes Relative: 9 %
Neutro Abs: 2.5 10*3/uL (ref 1.7–7.7)
Neutrophils Relative %: 56 %
Platelet Count: 244 10*3/uL (ref 150–400)
RBC: 4.64 MIL/uL (ref 3.87–5.11)
RDW: 13.6 % (ref 11.5–15.5)
WBC Count: 4.5 10*3/uL (ref 4.0–10.5)
nRBC: 0 % (ref 0.0–0.2)

## 2023-08-03 LAB — GENETIC SCREENING ORDER

## 2023-08-03 NOTE — Progress Notes (Signed)
[facility] Spiritual Care Note  Met with [place] and her husband in Breast Multidisciplinary Clinic to introduce Support Center team/resources.  She completed SDOH screening; results follow below.    SDOH Screenings   Food Insecurity: No Food Insecurity (08/03/2023)  Housing: Low Risk  (08/03/2023)  Transportation Needs: No Transportation Needs (08/03/2023)  Utilities: Not At Risk (08/03/2023)  Depression (PHQ2-9): Low Risk  (08/03/2023)  Tobacco Use: Medium Risk (08/03/2023)    Chaplain and patient discussed common feelings and emotions when being diagnosed with cancer, and the importance of support during treatment.  Chaplain informed patient of the support team and support services at [facility].  Chaplain provided contact information and encouraged patient to call with any questions or concerns.  Ms Heyder reports having had several surgeries in the past, which helps her maintain perspective about her new breast cancer diagnosis and treatment plan. She has five grandchildren ages 60 and under, whom she sees/watches regularly, who are sources of joy and purpose for her.   Follow up needed: No. Ms Fill plans to phone Spiritual Care as needed/desired.   [address] [place], [place], [facility] Pager [phone] Voicemail [phone]

## 2023-08-03 NOTE — Progress Notes (Signed)
Radiation Oncology         [phone]) [phone] ________________________________  Multidisciplinary Breast Oncology Clinic [facility]) Initial Outpatient Consultation  Name: Miranda Marquez MRN: [id number]  Date: 08/03/2023  DOB: 04-20-1961  WU:[id number], Carl Best, PA-C  Harriette Bouillon, MD   REFERRING PHYSICIAN: Harriette Bouillon, MD  DIAGNOSIS: The encounter diagnosis was Ductal carcinoma in situ (DCIS) of right breast.  Stage 0 (cTis (DCIS), cN0, cM0) Right Breast, Intermediate grade DCIS, ER+ / PR+ / Her2 not assessed    ICD-10-CM   1. Ductal carcinoma in situ (DCIS) of right breast  D05.11       HISTORY OF PRESENT ILLNESS::Miranda Marquez is a 62 y.o. female who is presenting to the office today for evaluation of her newly diagnosed breast cancer. She is accompanied by her husband. She is doing well overall.   She had routine screening mammography on 06/30/23 showing a possible abnormality in the right breast. She underwent a right breast mammogram at [facility] on 07/19/23 which showed an indeterminate 7 mm group of calcifications in the medial posterior right breast.   Of note: she has a history of nipple discharge in 2021 and a subsequent biopsy at that time which showed a possible sclerosing papilloma in the left breast. Surgical consultation was recommended but not pursued. She did however present for a follow up bilateral diagnostic mammogram and left breast ultrasound on 04/15/22 which showed no change in the previously biopsied mass and no evidence of malignancy.    Biopsy of the inner right breast on 07/26/23 showed: intermediate grade DCIS measuring 3 mm in the greatest linear extent of the sample with calcifications present. Prognostic indicators significant for: estrogen receptor, 100% positive and progesterone receptor, 100% positive, both with strong staining intensity. Her2 not assessed.   Menarche: 62 years old Age at first live birth: 62 years old GP: 4 LMP: postmenopausal (did  not indicate LMP date on the provided form) Contraceptive: yes for 5 years HRT: never used   The patient was referred today for presentation in the multidisciplinary conference.  Radiology studies and pathology slides were presented there for review and discussion of treatment options.  A consensus was discussed regarding potential next steps.  PREVIOUS RADIATION THERAPY: No  PAST MEDICAL HISTORY:  Past Medical History:  Diagnosis Date   Arthritis    Breast cancer (HCC)    Cervical herniated disc    Complication of anesthesia    hypotension  in april after hysterectomy   Gestational diabetes    history..back 21 plus yrs ago.  Now she is ok   Hypertension    not taking meds    Pituitary microadenoma with hyperprolactinemia (HCC)    Psoriasis both elbows, scalp, left thigh, saw dr Terri Piedra, last week given cream for areas healing   Sleep apnea    never had study done, but things she does have it    PAST SURGICAL HISTORY: Past Surgical History:  Procedure Laterality Date   ABDOMINAL HYSTERECTOMY     APPENDECTOMY     BREAST BIOPSY Right 10/11/2014   BREAST BIOPSY Right 07/26/2023   MM RT BREAST BX W LOC DEV 1ST LESION IMAGE BX SPEC STEREO GUIDE 07/26/2023 [facility]AMMO[facility]   BREAST EXCISIONAL BIOPSY Right 07/2015   BREAST EXCISIONAL BIOPSY Right    BREAST LUMPECTOMY WITH RADIOACTIVE SEED LOCALIZATION Right 08/07/2015   Procedure: RIGHT BREAST LUMPECTOMY WITH RADIOACTIVE SEED LOCALIZATION AND EXCISION OF BREAST MASS X2;  Surgeon: Abigail Miyamoto, MD;  Location: [facility] OR;  Service: General;  Laterality: Right;   BREAST SURGERY     DILATION AND CURETTAGE OF UTERUS     FOOT SURGERY     left heel     HERNIA REPAIR     umbilical   MAXIMUM ACCESS (MAS)POSTERIOR LUMBAR INTERBODY FUSION (PLIF) 1 LEVEL N/A 09/09/2016   Procedure: MAXIMUM ACCESS SURGERY POSTERIOR LUMBAR INTERBODY FUSION LUMBAR THREE-FOUR;  Surgeon: Tia Alert, MD;  Location: [facility] OR;  Service: Neurosurgery;  Laterality: N/A;    MINI-LAPAROTOMY W/ TUBAL LIGATION     ROBOTIC ASSISTED TOTAL HYSTERECTOMY WITH BILATERAL SALPINGO OOPHERECTOMY Bilateral 12/03/2014   Procedure: ROBOTIC ASSISTED TOTAL HYSTERECTOMY WITH BILATERAL SALPINGO OOPHORECTOMY;  Surgeon: Adolphus Birchwood, MD;  Location: [facility] ORS;  Service: Gynecology;  Laterality: Bilateral;   TOTAL HIP ARTHROPLASTY Right 10/18/2017   Procedure: RIGHT TOTAL HIP ARTHROPLASTY ANTERIOR APPROACH;  Surgeon: Durene Romans, MD;  Location: [facility] ORS;  Service: Orthopedics;  Laterality: Right;  70 mins   TUBAL LIGATION      FAMILY HISTORY:  Family History  Problem Relation Age of Onset   Melanoma Father 50       arm   Hypertension Father    Prostate cancer Father 32       mets in 90s   Breast cancer Paternal Aunt 49   Lung cancer Maternal Grandfather 39   Melanoma Paternal Grandfather 69       mets   Prostate cancer Cousin 29    SOCIAL HISTORY:  Social History   Socioeconomic History   Marital status: Married    Spouse name: Not on file   Number of children: Not on file   Years of education: Not on file   Highest education level: Not on file  Occupational History   Not on file  Tobacco Use   Smoking status: Former    Current packs/day: 0.00    Types: Cigarettes    Quit date: 08/23/1978    Years since quitting: 44.9   Smokeless tobacco: Never  Vaping Use   Vaping status: Never Used  Substance and Sexual Activity   Alcohol use: Yes    Alcohol/week: 2.0 standard drinks of alcohol    Types: 2 Glasses of wine per week    Comment: occasional, weekends   Drug use: No   Sexual activity: Yes  Other Topics Concern   Not on file  Social History Narrative   Not on file   Social Determinants of Health   Financial Resource Strain: Not on file  Food Insecurity: No Food Insecurity (08/03/2023)   Hunger Vital Sign    Worried About Running Out of Food in the Last Year: Never true    Ran Out of Food in the Last Year: Never true  Transportation Needs: No Transportation  Needs (08/03/2023)   PRAPARE - Administrator, Civil Service (Medical): No    Lack of Transportation (Non-Medical): No  Physical Activity: Not on file  Stress: Not on file  Social Connections: Not on file    ALLERGIES: No Known Allergies  MEDICATIONS:  Current Outpatient Medications  Medication Sig Dispense Refill   BD PEN NEEDLE NANO 2ND GEN 32G X 4 MM MISC USE AS DIRECTED 100 each 6   dapagliflozin propanediol (FARXIGA) 10 MG TABS tablet Take 1 tablet (10 mg total) by mouth daily before breakfast. 90 tablet 3   diclofenac (VOLTAREN) 75 MG EC tablet Take 75 mg by mouth 2 (two) times daily.     glucose blood (ONETOUCH VERIO) test  strip Use as instructed 100 each 12   hydrochlorothiazide (HYDRODIURIL) 25 MG tablet Take 1 tablet (25 mg total) by mouth daily. 90 tablet 2   insulin glargine (LANTUS SOLOSTAR) 100 UNIT/ML Solostar Pen Inject 10 Units into the skin daily. (Patient taking differently: Inject 8 Units into the skin daily.) 30 mL 3   latanoprost (XALATAN) 0.005 % ophthalmic solution Place 1 drop into both eyes at bedtime.     metFORMIN (GLUCOPHAGE-XR) 500 MG 24 hr tablet Take 2 tablets (1,000 mg total) by mouth in the morning and at bedtime. 360 tablet 3   rosuvastatin (CRESTOR) 20 MG tablet Take 1 tablet (20 mg total) by mouth daily. 90 tablet 3   tirzepatide (MOUNJARO) 2.5 MG/0.5ML Pen Inject 2.5 mg into the skin once a week. 6 mL 3   valsartan (DIOVAN) 320 MG tablet Take 1 tablet (320 mg total) by mouth daily. 90 tablet 3   No current facility-administered medications for this encounter.    REVIEW OF SYSTEMS: A 10+ POINT REVIEW OF SYSTEMS WAS OBTAINED including neurology, dermatology, psychiatry, cardiac, respiratory, lymph, extremities, GI, GU, musculoskeletal, constitutional, reproductive, HEENT. On the provided form, she reports psoriasis and diabetes. She denies any other symptoms.    PHYSICAL EXAM:      08/03/2023  Vitals with BMI   Height 5\' 8"    Weight  267 lbs 14 oz   BMI 40.74   Systolic 145 !   Diastolic 59 !   Pulse 75     Legend: ! Abnormal  Lungs are clear to auscultation bilaterally. Heart has regular rate and rhythm. No palpable cervical, supraclavicular, or axillary adenopathy. Abdomen soft, non-tender, normal bowel sounds. Breast: Left breast with no palpable mass, nipple discharge, or bleeding. Right breast with a biopsy site in the medial aspect of the breast, with no nipple discharge or bleeding appreciated.    KPS = 100  100 - Normal; no complaints; no evidence of disease. 90   - Able to carry on normal activity; minor signs or symptoms of disease. 80   - Normal activity with effort; some signs or symptoms of disease. 44   - Cares for self; unable to carry on normal activity or to do active work. 60   - Requires occasional assistance, but is able to care for most of his personal needs. 50   - Requires considerable assistance and frequent medical care. 40   - Disabled; requires special care and assistance. 30   - Severely disabled; hospital admission is indicated although death not imminent. 20   - Very sick; hospital admission necessary; active supportive treatment necessary. 10   - Moribund; fatal processes progressing rapidly. 0     - Dead  Karnofsky DA, Abelmann WH, Craver LS and Burchenal [facility] [phone]) The use of the nitrogen mustards in the palliative treatment of carcinoma: with particular reference to bronchogenic carcinoma Cancer 1 [id number]  LABORATORY DATA:  Lab Results  Component Value Date   WBC 4.5 08/03/2023   HGB 13.9 08/03/2023   HCT 40.4 08/03/2023   MCV 87.1 08/03/2023   PLT 244 08/03/2023   Lab Results  Component Value Date   NA 138 08/03/2023   K 3.6 08/03/2023   CL 102 08/03/2023   CO2 27 08/03/2023   Lab Results  Component Value Date   ALT 21 08/03/2023   AST 16 08/03/2023   ALKPHOS 62 08/03/2023   BILITOT 0.7 08/03/2023    PULMONARY FUNCTION TEST:   Review Flowsheet  No data  to display          RADIOGRAPHY: MM RT BREAST BX W LOC DEV 1ST LESION IMAGE BX SPEC STEREO GUIDE  Addendum Date: 07/28/2023   ADDENDUM REPORT: 07/28/2023 08:57 ADDENDUM: Pathology revealed DUCTAL CARCINOMA IN SITU, INTERMEDIATE GRADE, NECROSIS: NOT IDENTIFIED, CALCIFICATIONS: PRESENT of the RIGHT breast, inner, (x clip). This was found to be concordant by Dr. Laveda Abbe. Pathology results were discussed with the patient by telephone. The patient reported doing well after the biopsy with tenderness and rash at the site. Post biopsy instructions and care were reviewed and questions were answered. The patient was encouraged to call [facility] for any additional concerns. My direct phone number was provided. The patient was referred to [facility] on August 03, 2023. Pathology results reported by Rene Kocher, RN on 07/28/2023. Electronically Signed   By: Harmon Pier M.D.   On: 07/28/2023 08:57   Result Date: 07/28/2023 CLINICAL DATA:  62 year old female presents for tissue sampling of 0.7 cm group of INNER RIGHT breast calcifications. EXAM: RIGHT BREAST STEREOTACTIC CORE NEEDLE BIOPSY COMPARISON:  Previous exam(s). FINDINGS: The patient and I discussed the procedure of stereotactic-guided biopsy including benefits and alternatives. We discussed the high likelihood of a successful procedure. We discussed the risks of the procedure including infection, bleeding, tissue injury, clip migration, and inadequate sampling. Informed written consent was given. The usual time out protocol was performed immediately prior to the procedure. Using sterile technique and 1% Lidocaine with and without epinephrine as local anesthetic, under stereotactic guidance, a 9 gauge vacuum assisted device was used to perform core needle biopsy of the 0.7 cm group of INNER RIGHT breast calcifications using a MEDIAL approach. Specimen  radiograph was performed showing calcifications. Specimens with calcifications are identified for pathology. At the conclusion of the procedure, an X shaped tissue marker clip was deployed into the biopsy cavity. Follow-up 2-view mammogram was performed and dictated separately. IMPRESSION: Stereotactic-guided biopsy of 0.7 cm group of INNER RIGHT breast calcifications. No apparent complications. Electronically Signed: By: Harmon Pier M.D. On: 07/26/2023 14:10   MM CLIP PLACEMENT RIGHT  Result Date: 07/26/2023 CLINICAL DATA:  Evaluate X biopsy clip placement following stereotactic guided RIGHT breast biopsy. EXAM: 3D DIAGNOSTIC RIGHT MAMMOGRAM POST STEREOTACTIC BIOPSY COMPARISON:  Previous exam(s). FINDINGS: 3D Mammographic images were obtained following stereotactic guided biopsy of the 0.7 cm group of INNER RIGHT breast calcifications. The biopsy marking clip is in expected position at the site of biopsy. IMPRESSION: Appropriate positioning of the X shaped biopsy marking clip at the site of biopsy in the INNER RIGHT breast. Final Assessment: Post Procedure Mammograms for Marker Placement Electronically Signed   By: Harmon Pier M.D.   On: 07/26/2023 14:20   MM Digital Diagnostic Unilat R  Result Date: 07/19/2023 CLINICAL DATA:  Screening recall for right breast calcifications. EXAM: DIGITAL DIAGNOSTIC UNILATERAL RIGHT MAMMOGRAM TECHNIQUE: Right digital diagnostic mammography was performed. COMPARISON:  Previous exam(s). ACR Breast Density Category b: There are scattered areas of fibroglandular density. FINDINGS: Spot compression magnification images over the medial posterior right breast demonstrates a persistent 7 mm group of amorphous calcifications. IMPRESSION: Indeterminate 7 mm group of amorphous calcifications in the medial posterior right breast. RECOMMENDATION: Stereotactic biopsy is recommended. We will call the patient to schedule the procedure at her earliest convenience. I have discussed the  findings and recommendations with the patient. If applicable, a reminder letter will  be sent to the patient regarding the next appointment. BI-RADS CATEGORY  4: Suspicious. Electronically Signed   By: Frederico Hamman M.D.   On: 07/19/2023 13:50      IMPRESSION: Stage 0 (cTis (DCIS), cN0, cM0) Right Breast, Intermediate grade DCIS, ER+ / PR+ / Her2 not assessed  Patient will be a good candidate for breast conservation with radiotherapy to the right breast. We discussed the general course of radiation, potential side effects, and toxicities with radiation and the patient is interested in this approach.    PLAN:  Genetics  Right breast lumpectomy  Adjuvant radiation therapy  Aromatase inhibitor    ------------------------------------------------  Billie Lade, PhD, MD  This document serves as a record of services personally performed by Antony Blackbird, MD. It was created on his behalf by Neena Rhymes, a trained medical scribe. The creation of this record is based on the scribe's personal observations and the provider's statements to them. This document has been checked and approved by the attending provider.

## 2023-08-03 NOTE — Progress Notes (Signed)
REFERRING PROVIDER: Serena Croissant, MD [address] [place],  [place] [address]  PRIMARY PROVIDER:  [name], PA-C  PRIMARY REASON FOR VISIT:  1. Ductal carcinoma in situ (DCIS) of right breast   2. Family history of breast cancer   3. Family history of prostate cancer   4. Family history of melanoma      HISTORY OF PRESENT ILLNESS:   Miranda Marquez, a 62 y.o. female, was seen for a [place] cancer genetics consultation during the breast multidisciplinary clinic at the request of Dr. Pamelia Hoit due to a personal history of breast cancer.  Miranda Marquez presents to clinic today to discuss the possibility of a hereditary predisposition to cancer, to discuss genetic testing, and to further clarify her future cancer risks, as well as potential cancer risks for family members.   In December 2024, at the age of 48, Miranda Marquez was diagnosed with ductal carcinoma in situ of the right breast (ER+/PR+). The treatment plan is pending.  She also has a history of a pituitary microadenoma diagnosed ~20 years ago.      Past Medical History:  Diagnosis Date   Arthritis    Breast cancer (HCC)    Cervical herniated disc    Complication of anesthesia    hypotension  in april after hysterectomy   Gestational diabetes    history..back 21 plus yrs ago.  Now she is ok   Hypertension    not taking meds    Pituitary microadenoma with hyperprolactinemia (HCC)    Psoriasis both elbows, scalp, left thigh, saw dr Terri Piedra, last week given cream for areas healing   Sleep apnea    never had study done, but things she does have it    Past Surgical History:  Procedure Laterality Date   ABDOMINAL HYSTERECTOMY     APPENDECTOMY     BREAST BIOPSY Right 10/11/2014   BREAST BIOPSY Right 07/26/2023   MM RT BREAST BX W LOC DEV 1ST LESION IMAGE BX SPEC STEREO GUIDE 07/26/2023 [facility]AMMOGRAP[facility]   BREAST EXCISIONAL BIOPSY Right 07/2015   BREAST EXCISIONAL BIOPSY Right    BREAST LUMPECTOMY WITH RADIOACTIVE  SEED LOCALIZATION Right 08/07/2015   Procedure: RIGHT BREAST LUMPECTOMY WITH RADIOACTIVE SEED LOCALIZATION AND EXCISION OF BREAST MASS X2;  Surgeon: Abigail Miyamoto, MD;  Location: [facility] OR;  Service: General;  Laterality: Right;   BREAST SURGERY     DILATION AND CURETTAGE OF UTERUS     FOOT SURGERY     left heel     HERNIA REPAIR     umbilical   MAXIMUM ACCESS (MAS)POSTERIOR LUMBAR INTERBODY FUSION (PLIF) 1 LEVEL N/A 09/09/2016   Procedure: MAXIMUM ACCESS SURGERY POSTERIOR LUMBAR INTERBODY FUSION LUMBAR THREE-FOUR;  Surgeon: Tia Alert, MD;  Location[facility] OR;  Service: Neurosurgery;  Laterality: N/A;   MINI-LAPAROTOMY W/ TUBAL LIGATION     ROBOTIC ASSISTED TOTAL HYSTERECTOMY WITH BILATERAL SALPINGO OOPHERECTOMY Bilateral 12/03/2014   Procedure: ROBOTIC ASSISTED TOTAL HYSTERECTOMY WITH BILATERAL SALPINGO OOPHORECTOMY;  Surgeon: Adolphus Birchwood, MD;  Location: [facility];  Service: Gynecology;  Laterality: Bilateral;   TOTAL HIP ARTHROPLASTY Right 10/18/2017   Procedure: RIGHT TOTAL HIP ARTHROPLASTY ANTERIOR APPROACH;  Surgeon: Durene Romans, MD;  Location: [facility];  Service: Orthopedics;  Laterality: Right;  70 mins   TUBAL LIGATION       FAMILY HISTORY:  We obtained a detailed, 4-generation family history.  Significant diagnoses are listed below: Family History  Problem Relation Age of Onset   Melanoma Father 57  arm   Prostate cancer Father 55       mets in 90s   Breast cancer Paternal Aunt 59   Lung cancer Maternal Grandfather 87   Melanoma Paternal Grandfather 63       mets   Prostate cancer Cousin 6    Miranda Marquez is unaware of previous family history of genetic testing for hereditary cancer risks. There is no reported Ashkenazi Jewish ancestry. There is no known consanguinity.  Other relatives are not available for testing at this time.   GENETIC COUNSELING ASSESSMENT: Miranda Marquez is a 62 y.o. female with a personal and family history which is somewhat suggestive of a hereditary cancer  syndrome and predisposition to cancer given the presence of related cancers in multiple generations (breast cancer, prostate cancer, and melanoma). We, therefore, discussed and recommended the following at today's visit.   DISCUSSION: We discussed that 5 - 10% of cancer is hereditary, with most cases of hereditary breast cancer associated with mutations in BRCA1/2.  There are other genes that can be associated with hereditary breast cancer, prostate cancer, or melanoma syndromes. Type of cancer risk and level of risk are gene-specific. We discussed that testing is beneficial for several reasons including knowing how to follow individuals after completing their treatment, identifying whether potential treatment/surgery options would be beneficial, and understanding if other family members could be at risk for cancer and allowing them to undergo genetic testing.   We reviewed the characteristics, features and inheritance patterns of hereditary cancer syndromes. We also discussed genetic testing, including the appropriate family members to test, the process of testing, insurance coverage and turn-around-time for results. We discussed the implications of a negative, positive and/or variant of uncertain significant result. In order to get genetic test results in a timely manner so that Miranda Marquez can use these genetic test results for surgical decisions, we recommended Miranda Marquez pursue genetic testing for the [employer]  The BRCAPlus gene panel offered by [facility] and includes sequencing and rearrangement analysis for the following 13 genes: ATM, BARD1, BRCA1, BRCA2, CDH1, CHEK2, NF1, PALB2, PTEN, RAD51C, RAD51D, STK11 and TP53.  Once complete, we recommend Miranda Marquez pursue reflex genetic testing to a more comprehensive gene panel.   The CustomNext-Expanded (CancerNext+melanoma genes+CNS genes) gene panel offered by [facility] [facility] and includes sequencing, rearrangement, and RNA analysis for the  following 56 genes:  AIP, ALK, APC, ATM, AXIN2, BAP1, BARD1, BMPR1A, BRCA1, BRCA2, BRIP1, CDH1, CDK4, CDKN1B, CDKN2A, CHEK2, DICER1, EPCAM, FH, FLCN, GREM1, HOXB13, MBD4, MEN1, MET, MITF, MLH1, MSH2, MSH3, MSH6, MUTYH, NF1, NF2, NTHL1, PALB2, PHOX2B, PMS2, POLD1, POLE, POT1, PRKAR1A, PTCH1, PTEN, RAD51C, RAD51D, RB1, SMAD4, SMARCA4, SMARCB1, SMARCE1, STK11, SUFU, TP53, TSC1, TSC2, VHL  Based on Miranda Marquez's personal history of breast cancer and her father's history of metastatic prostate cancer, she meets NCCN criteria for genetic testing. She is the most informative relative to test at this time. Despite that she meets criteria, she may still have an out of pocket cost. We discussed that if her out of pocket cost for testing is over $100, the laboratory should contact them to discuss self-pay prices, patient pay assistance programs, if applicable, and other billing options.   PLAN: After considering the risks, benefits, and limitations, Miranda Marquez provided informed consent to pursue genetic testing and the blood sample was sent to [facility] for analysis of the BRCAPlus and CustomNext-Cancer +RNA. Results should be available within approximately 2 weeks' time, at which point they will be  disclosed by telephone to Miranda Marquez, as will any additional recommendations warranted by these results. Miranda Marquez will receive a summary of her genetic counseling visit and a copy of her results once available. This information will also be available in Epic.   Miranda Marquez questions were answered to her satisfaction today. Our contact information was provided should additional questions or concerns arise. Thank you for the referral and allowing [place] to share in the care of your patient.   Margreat Widener M. [name], MS, [facility] Genetic Counselor [email or web address] (P) [phone]  The patient was seen for a total of 16 minutes in face-to-face genetic counseling.  The patient was accompanied by her husband.  Dr. Pamelia Hoit  was available to discuss this case as needed.  _______________________________________________________________________ For Office Staff:  Number of people involved in session: 2 Was an Intern/ student involved with case: no

## 2023-08-03 NOTE — Progress Notes (Signed)
[facility] CONSULT NOTE  Patient Care Team: [name] as PCP - General (Family Medicine) Meriam Sprague, MD (Inactive) as PCP - Cardiology (Cardiology) Donnelly Angelica, RN as Oncology Nurse Navigator Pershing Proud, RN as Oncology Nurse Navigator Harriette Bouillon, MD as Consulting Physician (General Surgery) Serena Croissant, MD as Consulting Physician (Hematology and Oncology) Antony Blackbird, MD as Consulting Physician (Radiation Oncology)  CHIEF COMPLAINTS/PURPOSE OF CONSULTATION:  Newly diagnosed breast cancer  HISTORY OF PRESENTING ILLNESS:   History of Present Illness   The patient, with a history of a benign papilloma removed from the breast in 2016, presents for a consultation following a recent diagnosis of ductal carcinoma in situ (DCIS). The diagnosis was made following a routine mammogram that showed a few calcium deposits. the DCIS is very tiny, measuring 0.7 centimeters in size, and is positive 100% for both estrogen and progesterone receptors. The grade of the DCIS is intermediate or grade 2. The patient also has a history of a pituitary microadenoma for which she was treated with Parlodel for about 20 years. The patient also had a total hysterectomy about 9 or 10 years ago. The patient has three daughters and a son and is concerned about the genetic implications of her diagnosis.     I reviewed her records extensively and collaborated the history with the patient.  MEDICAL HISTORY:  Past Medical History:  Diagnosis Date   Arthritis    Breast cancer (HCC)    Cervical herniated disc    Complication of anesthesia    hypotension  in april after hysterectomy   Gestational diabetes    history..back 21 plus yrs ago.  Now she is ok   Hypertension    not taking meds    Pituitary microadenoma with hyperprolactinemia (HCC)    Psoriasis both elbows, scalp, left thigh, saw dr Terri Piedra, last week given cream for areas healing   Sleep apnea    never  had study done, but things she does have it    SURGICAL HISTORY: Past Surgical History:  Procedure Laterality Date   ABDOMINAL HYSTERECTOMY     APPENDECTOMY     BREAST BIOPSY Right 10/11/2014   BREAST BIOPSY Right 07/26/2023   MM RT BREAST BX W LOC DEV 1ST LESION IMAGE BX SPEC STEREO GUIDE 07/26/2023 [facility]MO[facility]   BREAST EXCISIONAL BIOPSY Right 07/2015   BREAST EXCISIONAL BIOPSY Right    BREAST LUMPECTOMY WITH RADIOACTIVE SEED LOCALIZATION Right 08/07/2015   Procedure: RIGHT BREAST LUMPECTOMY WITH RADIOACTIVE SEED LOCALIZATION AND EXCISION OF BREAST MASS X2;  Surgeon: Abigail Miyamoto, MD;  Location: [facility] OR;  Service: General;  Laterality: Right;   BREAST SURGERY     DILATION AND CURETTAGE OF UTERUS     FOOT SURGERY     left heel     HERNIA REPAIR     umbilical   MAXIMUM ACCESS (MAS)POSTERIOR LUMBAR INTERBODY FUSION (PLIF) 1 LEVEL N/A 09/09/2016   Procedure: MAXIMUM ACCESS SURGERY POSTERIOR LUMBAR INTERBODY FUSION LUMBAR THREE-FOUR;  Surgeon: Tia Alert, MD;  Location: [facility] OR;  Service: Neurosurgery;  Laterality: N/A;   MINI-LAPAROTOMY W/ TUBAL LIGATION     ROBOTIC ASSISTED TOTAL HYSTERECTOMY WITH BILATERAL SALPINGO OOPHERECTOMY Bilateral 12/03/2014   Procedure: ROBOTIC ASSISTED TOTAL HYSTERECTOMY WITH BILATERAL SALPINGO OOPHORECTOMY;  Surgeon: Adolphus Birchwood, MD;  Location: [facility] ORS;  Service: Gynecology;  Laterality: Bilateral;   TOTAL HIP ARTHROPLASTY Right 10/18/2017   Procedure: RIGHT TOTAL HIP ARTHROPLASTY ANTERIOR APPROACH;  Surgeon: Durene Romans, MD;  Location: [facility] ORS;  Service: Orthopedics;  Laterality: Right;  70 mins   TUBAL LIGATION      SOCIAL HISTORY: Social History   Socioeconomic History   Marital status: Married    Spouse name: Not on file   Number of children: Not on file   Years of education: Not on file   Highest education level: Not on file  Occupational History   Not on file  Tobacco Use   Smoking status: Former    Current packs/day: 0.00    Types:  Cigarettes    Quit date: 08/23/1978    Years since quitting: 44.9   Smokeless tobacco: Never  Vaping Use   Vaping status: Never Used  Substance and Sexual Activity   Alcohol use: Yes    Alcohol/week: 2.0 standard drinks of alcohol    Types: 2 Glasses of wine per week    Comment: occasional, weekends   Drug use: No   Sexual activity: Yes  Other Topics Concern   Not on file  Social History Narrative   Not on file   Social Determinants of Health   Financial Resource Strain: Not on file  Food Insecurity: No Food Insecurity (08/03/2023)   Hunger Vital Sign    Worried About Running Out of Food in the Last Year: Never true    Ran Out of Food in the Last Year: Never true  Transportation Needs: No Transportation Needs (08/03/2023)   PRAPARE - Administrator, Civil Service (Medical): No    Lack of Transportation (Non-Medical): No  Physical Activity: Not on file  Stress: Not on file  Social Connections: Not on file  Intimate Partner Violence: Not At Risk (08/03/2023)   Humiliation, Afraid, Rape, and Kick questionnaire    Fear of Current or Ex-Partner: No    Emotionally Abused: No    Physically Abused: No    Sexually Abused: No    FAMILY HISTORY: Family History  Problem Relation Age of Onset   Melanoma Father 50       arm   Hypertension Father    Prostate cancer Father 55       mets in 90s   Breast cancer Paternal Aunt 22   Lung cancer Maternal Grandfather 67   Melanoma Paternal Grandfather 71       mets   Prostate cancer Cousin 59    ALLERGIES:  has No Known Allergies.  MEDICATIONS:  Current Outpatient Medications  Medication Sig Dispense Refill   BD PEN NEEDLE NANO 2ND GEN 32G X 4 MM MISC USE AS DIRECTED 100 each 6   dapagliflozin propanediol (FARXIGA) 10 MG TABS tablet Take 1 tablet (10 mg total) by mouth daily before breakfast. 90 tablet 3   diclofenac (VOLTAREN) 75 MG EC tablet Take 75 mg by mouth 2 (two) times daily.     glucose blood (ONETOUCH VERIO)  test strip Use as instructed 100 each 12   hydrochlorothiazide (HYDRODIURIL) 25 MG tablet Take 1 tablet (25 mg total) by mouth daily. 90 tablet 2   insulin glargine (LANTUS SOLOSTAR) 100 UNIT/ML Solostar Pen Inject 10 Units into the skin daily. (Patient taking differently: Inject 8 Units into the skin daily.) 30 mL 3   latanoprost (XALATAN) 0.005 % ophthalmic solution Place 1 drop into both eyes at bedtime.     metFORMIN (GLUCOPHAGE-XR) 500 MG 24 hr tablet Take 2 tablets (1,000 mg total) by mouth in the morning and at bedtime. 360 tablet 3   rosuvastatin (CRESTOR) 20 MG tablet  Take 1 tablet (20 mg total) by mouth daily. 90 tablet 3   tirzepatide (MOUNJARO) 2.5 MG/0.5ML Pen Inject 2.5 mg into the skin once a week. 6 mL 3   valsartan (DIOVAN) 320 MG tablet Take 1 tablet (320 mg total) by mouth daily. 90 tablet 3   No current facility-administered medications for this visit.    REVIEW OF SYSTEMS:   Constitutional: Denies fevers, chills or abnormal night sweats All other systems were reviewed with the patient and are negative.  PHYSICAL EXAMINATION: ECOG PERFORMANCE STATUS: 0 - Asymptomatic  Vitals:   08/03/23 1254  BP: (!) 145/59  Pulse: 75  Resp: 18  Temp: 98.1 F (36.7 C)  SpO2: 97%   Filed Weights   08/03/23 1254  Weight: 267 lb 14.4 oz (121.5 kg)    GENERAL:alert, no distress and comfortable  LABORATORY DATA:  I have reviewed the data as listed Lab Results  Component Value Date   WBC 4.5 08/03/2023   HGB 13.9 08/03/2023   HCT 40.4 08/03/2023   MCV 87.1 08/03/2023   PLT 244 08/03/2023   Lab Results  Component Value Date   NA 138 08/03/2023   K 3.6 08/03/2023   CL 102 08/03/2023   CO2 27 08/03/2023    RADIOGRAPHIC STUDIES: I have personally reviewed the radiological reports and agreed with the findings in the report.  ASSESSMENT AND PLAN:    Ductal Carcinoma In Situ (DCIS) Diagnosed via mammogram and biopsy. Size estimated at 0.7 cm. Estrogen and progesterone  receptor positive. Intermediate grade. No invasive ductal cancer identified. Family history of breast cancer in paternal aunt. -Plan for lumpectomy surgery. -Post-surgery, consider radiation therapy and antiestrogen therapy (e.g., Tamoxifen 5mg  daily for 5 years) to reduce recurrence risk. -Genetic testing drawn and pending, to be discussed with genetic counselor.  Pituitary Microadenoma History of treatment with Parlodel for prolactinoma. Currently not on treatment. -No immediate action required, monitor for symptoms.  All questions were answered. The patient knows to call the clinic with any problems, questions or concerns.    Tamsen Meek, MD 08/03/23

## 2023-08-05 ENCOUNTER — Encounter (HOSPITAL_BASED_OUTPATIENT_CLINIC_OR_DEPARTMENT_OTHER): Payer: Self-pay | Admitting: Cardiology

## 2023-08-05 ENCOUNTER — Ambulatory Visit (INDEPENDENT_AMBULATORY_CARE_PROVIDER_SITE_OTHER): Payer: 59 | Admitting: Cardiology

## 2023-08-05 ENCOUNTER — Other Ambulatory Visit: Payer: Self-pay | Admitting: Surgery

## 2023-08-05 VITALS — BP 106/75 | HR 78 | Ht 68.0 in | Wt 268.0 lb

## 2023-08-05 DIAGNOSIS — D0511 Intraductal carcinoma in situ of right breast: Secondary | ICD-10-CM

## 2023-08-05 DIAGNOSIS — I2510 Atherosclerotic heart disease of native coronary artery without angina pectoris: Secondary | ICD-10-CM | POA: Diagnosis not present

## 2023-08-05 DIAGNOSIS — I10 Essential (primary) hypertension: Secondary | ICD-10-CM | POA: Diagnosis not present

## 2023-08-05 DIAGNOSIS — E119 Type 2 diabetes mellitus without complications: Secondary | ICD-10-CM | POA: Diagnosis not present

## 2023-08-05 DIAGNOSIS — E7800 Pure hypercholesterolemia, unspecified: Secondary | ICD-10-CM | POA: Diagnosis not present

## 2023-08-05 DIAGNOSIS — Z6841 Body Mass Index (BMI) 40.0 and over, adult: Secondary | ICD-10-CM

## 2023-08-05 DIAGNOSIS — Z794 Long term (current) use of insulin: Secondary | ICD-10-CM

## 2023-08-05 NOTE — Patient Instructions (Signed)
Medication Instructions:  Continue same medications *If you need a refill on your cardiac medications before your next appointment, please call your pharmacy*   Lab Work: None ordered   Testing/Procedures: None ordered   Follow-Up: At [facility], you and your health needs are our priority.  As part of our continuing mission to provide you with exceptional heart care, we have created designated Provider Care Teams.  These Care Teams include your primary Cardiologist (physician) and Advanced Practice Providers (APPs -  Physician Assistants and Nurse Practitioners) who all work together to provide you with the care you need, when you need it.  We recommend signing up for the patient portal called "MyChart".  Sign up information is provided on this After Visit Summary.  MyChart is used to connect with patients for Virtual Visits (Telemedicine).  Patients are able to view lab/test results, encounter notes, upcoming appointments, etc.  Non-urgent messages can be sent to your provider as well.   To learn more about what you can do with MyChart, go to [email or web address].    Your next appointment:  1 year  Call in  August to schedule Dec appointment     Provider:  Dr.Christopher

## 2023-08-05 NOTE — Progress Notes (Signed)
Cardiology Office Note:  .   Date:  08/05/2023  ID:  Miranda Marquez, DOB [birth year], MRN [id number] PCP: [name]  [facility] Providers Cardiologist:  Meriam Sprague, MD (Inactive) {  History of Present Illness: Marland Kitchen   Miranda Marquez is a 62 y.o. female hypertension, type II diabetes, hyperlipidemia, coronary artery calcification, pituitary microadenoma. She was previously followed by Dr. Delton See and Dr. Shari Prows, established care with me on 08/05/23.  Pertinent CV history: Nuclear stress 07/2022 low risk, calcium score 03/2021 242 (95th pecentile), echo 03/2021 EF 60-655, mild LVH, G1DD, RV normal, no significant valve disease.  Today: Pending surgery for her R breast cancer 08/11/23. Had preop eval for her hip surgery, no new symptoms today. Now able to be more mobile since hip surgery. Reviewed history, risk, symptoms together today.   ROS: Denies chest pain, shortness of breath at rest or with normal exertion. No PND, orthopnea, LE edema or unexpected weight gain. No syncope or palpitations. ROS otherwise negative except as noted.   Studies Reviewed: Marland Kitchen    EKG:  EKG Interpretation Date/Time:  Friday August 05 2023 10:52:32 EST Ventricular Rate:  78 PR Interval:  170 QRS Duration:  134 QT Interval:  416 QTC Calculation: 474 R Axis:   6  Text Interpretation: Normal sinus rhythm Right bundle branch block Confirmed by Jodelle Red [phone]) on 08/05/2023 11:35:00 AM    Physical Exam:   VS:  BP 106/75 (BP Location: Left Arm, Patient Position: Sitting, Cuff Size: Large)   Pulse 78   Ht 5\' 8"  (1.727 m)   Wt 268 lb (121.6 kg)   SpO2 98%   BMI 40.75 kg/m    Wt Readings from Last 3 Encounters:  08/05/23 268 lb (121.6 kg)  08/03/23 267 lb 14.4 oz (121.5 kg)  02/28/23 265 lb (120.2 kg)    GEN: Well nourished, well developed in no acute distress HEENT: Normal, moist mucous membranes NECK: No JVD CARDIAC: regular rhythm, normal S1 and S2, no rubs or  gallops. No murmur. VASCULAR: Radial and DP pulses 2+ bilaterally. No carotid bruits RESPIRATORY:  Clear to auscultation without rales, wheezing or rhonchi  ABDOMEN: Soft, non-tender, non-distended MUSCULOSKELETAL:  Ambulates independently SKIN: Warm and dry, no edema NEUROLOGIC:  Alert and oriented x 3. No focal neuro deficits noted. PSYCHIATRIC:  Normal affect    ASSESSMENT AND PLAN: .    Coronary Artery Calcification, consistent with nonobstructive CAD Ca score 242 (95%). Myoview 07/2022 (for pre-op evaluation) with normal perfusion -no angina -Continue crestor 20mg  daily -Lifestyle modifications as below -reviewed red flag warning signs that need immediate medical attention   Hyperlipidemia: LDL improved to 64.  -Continue crestor 20mg  daily   DMII: -Continue metformin 1000mg  BID -Continue insulin -Continue farxiga 10mg  daily -Did not tolerate ozempic, now on mounjaro   HTN: -Continue valsartan 320mg  daily, hydrochlorothiazide 25mg  daily -at goal   Ascending Aortic Dilation: Normal on most recent TTE in 03/2021. No further monitoring needed.   Morbid Obesity: BMI 40.  -Discussed healthy diet and lifestyle -on mounjaro  CV risk counseling and prevention -recommend heart healthy/Mediterranean diet, with whole grains, fruits, vegetable, fish, lean meats, nuts, and olive oil. Limit salt. -recommend moderate walking, 3-5 times/week for 30-50 minutes each session. Aim for at least 150 minutes.week. Goal should be pace of 3 miles/hours, or walking 1.5 miles in 30 minutes -recommend avoidance of tobacco products. Avoid excess alcohol.  Dispo: 1 year or sooner as needed  Signed, Jodelle Red,  MD   Jodelle Red, MD, PhD, [facility]  [facility]  [place]  [facility] at [facility] at [facility] [address] [place], [place] [address] [phone]

## 2023-08-08 ENCOUNTER — Encounter: Payer: Self-pay | Admitting: *Deleted

## 2023-08-08 DIAGNOSIS — D0511 Intraductal carcinoma in situ of right breast: Secondary | ICD-10-CM

## 2023-08-09 ENCOUNTER — Other Ambulatory Visit: Payer: Self-pay | Admitting: Internal Medicine

## 2023-08-09 ENCOUNTER — Encounter: Payer: Self-pay | Admitting: *Deleted

## 2023-08-09 ENCOUNTER — Telehealth: Payer: Self-pay | Admitting: *Deleted

## 2023-08-09 DIAGNOSIS — E1165 Type 2 diabetes mellitus with hyperglycemia: Secondary | ICD-10-CM

## 2023-08-09 NOTE — Telephone Encounter (Signed)
Left vm regarding [facility] from 08/03/23. Contact information provided for questions or needs.

## 2023-08-10 ENCOUNTER — Encounter (HOSPITAL_COMMUNITY): Payer: Self-pay | Admitting: Surgery

## 2023-08-10 ENCOUNTER — Ambulatory Visit
Admission: RE | Admit: 2023-08-10 | Discharge: 2023-08-10 | Disposition: A | Discharge status: Home / Self Care | Payer: 59 | Source: Ambulatory Visit | Attending: Surgery | Admitting: Surgery

## 2023-08-10 DIAGNOSIS — D0511 Intraductal carcinoma in situ of right breast: Secondary | ICD-10-CM

## 2023-08-10 HISTORY — PX: BREAST BIOPSY: SHX20

## 2023-08-10 NOTE — Progress Notes (Signed)
SDW call  Patient was given pre-op instructions over the phone. Patient verbalized understanding of instructions provided.     PCP - Chilton Greathouse, PA-C Cardiologist - Dr. Jodelle Red,  cardiology clearance 08/05/2023 Pulmonary:    PPM/ICD - Denies Device Orders - na Rep Notified - na   Chest x-ray - na EKG -  08/05/2023 Stress Test - 12/22/20223 ECHO - 03/26/2021 Cardiac Cath -   Sleep Study/sleep apnea/CPAP: denies  Type II diabetic.   Fasting Blood sugar range: less than 120 How often check sugars: daily Mounjaro, states last dose 08/03/2023 [name], states last dose 08/10/2023 Metformin, instructed to hold DOS Lantus, instructed to use 6 units night before surgery which is 50% of her regular dose   Blood Thinner Instructions: denies Aspirin Instructions:denies   ERAS Protcol - Clears until 1230  COVID TEST- na    Anesthesia review: Yes. HTN, DM, on GlP1, seed implant for breast CA   Patient denies shortness of breath, fever, cough and chest pain over the phone call  Your procedure is scheduled on Thursday August 11, 2023  Report to [facility] Main Entrance "A" at  1:00 PM, then check in with the Admitting office.  Call this number if you have problems the morning of surgery:  [phone]   If you have any questions prior to your surgery date call [phone]: Open Monday-Friday 8am-4pm If you experience any cold or flu symptoms such as cough, fever, chills, shortness of breath, etc. between now and your scheduled surgery, please notify us at the above number     Remember:  Do not eat after midnight the night before your surgery  You may drink clear liquids until 12:30 the day of your surgery.   Clear liquids allowed are: Water, Non-Citrus Juices (without pulp), Carbonated Beverages, Clear Tea, Black Coffee ONLY (NO MILK, CREAM OR POWDERED CREAMER of any kind), and Gatorade   Take these medicines the morning of surgery with A SIP OF WATER:   None  As of today, STOP taking any Aspirin (unless otherwise instructed by your surgeon) Aleve, Naproxen, Ibuprofen, Motrin, Advil, Goody's, BC's, all herbal medications, fish oil, and all vitamins.  This includes your Voltaren

## 2023-08-11 ENCOUNTER — Encounter (HOSPITAL_COMMUNITY)
Admission: RE | Disposition: A | Discharge status: Home / Self Care | Payer: Self-pay | Source: Home / Self Care | Attending: Surgery

## 2023-08-11 ENCOUNTER — Ambulatory Visit (HOSPITAL_COMMUNITY): Payer: 59 | Admitting: Anesthesiology

## 2023-08-11 ENCOUNTER — Ambulatory Visit
Admission: RE | Admit: 2023-08-11 | Discharge: 2023-08-11 | Disposition: A | Discharge status: Home / Self Care | Payer: 59 | Source: Ambulatory Visit | Attending: Surgery | Admitting: Surgery

## 2023-08-11 ENCOUNTER — Encounter (HOSPITAL_COMMUNITY): Payer: Self-pay | Admitting: Surgery

## 2023-08-11 ENCOUNTER — Other Ambulatory Visit: Payer: Self-pay

## 2023-08-11 ENCOUNTER — Ambulatory Visit (HOSPITAL_COMMUNITY)
Admission: RE | Admit: 2023-08-11 | Discharge: 2023-08-11 | Disposition: A | Discharge status: Home / Self Care | Payer: 59 | Attending: Surgery | Admitting: Surgery

## 2023-08-11 DIAGNOSIS — E66813 Obesity, class 3: Secondary | ICD-10-CM | POA: Insufficient documentation

## 2023-08-11 DIAGNOSIS — Z794 Long term (current) use of insulin: Secondary | ICD-10-CM | POA: Insufficient documentation

## 2023-08-11 DIAGNOSIS — D0511 Intraductal carcinoma in situ of right breast: Secondary | ICD-10-CM | POA: Diagnosis present

## 2023-08-11 DIAGNOSIS — E119 Type 2 diabetes mellitus without complications: Secondary | ICD-10-CM | POA: Insufficient documentation

## 2023-08-11 DIAGNOSIS — E785 Hyperlipidemia, unspecified: Secondary | ICD-10-CM

## 2023-08-11 DIAGNOSIS — I10 Essential (primary) hypertension: Secondary | ICD-10-CM | POA: Diagnosis not present

## 2023-08-11 DIAGNOSIS — Z170 Estrogen receptor positive status [ER+]: Secondary | ICD-10-CM | POA: Insufficient documentation

## 2023-08-11 DIAGNOSIS — Z6839 Body mass index (BMI) 39.0-39.9, adult: Secondary | ICD-10-CM | POA: Insufficient documentation

## 2023-08-11 DIAGNOSIS — Z1721 Progesterone receptor positive status: Secondary | ICD-10-CM | POA: Diagnosis not present

## 2023-08-11 DIAGNOSIS — Z87891 Personal history of nicotine dependence: Secondary | ICD-10-CM | POA: Diagnosis not present

## 2023-08-11 DIAGNOSIS — Z7984 Long term (current) use of oral hypoglycemic drugs: Secondary | ICD-10-CM | POA: Insufficient documentation

## 2023-08-11 DIAGNOSIS — Z7985 Long-term (current) use of injectable non-insulin antidiabetic drugs: Secondary | ICD-10-CM | POA: Insufficient documentation

## 2023-08-11 HISTORY — PX: BREAST LUMPECTOMY WITH RADIOACTIVE SEED LOCALIZATION: SHX6424

## 2023-08-11 LAB — GLUCOSE, CAPILLARY
Glucose-Capillary: 135 mg/dL — ABNORMAL HIGH (ref 70–99)
Glucose-Capillary: 115 mg/dL — ABNORMAL HIGH (ref 70–99)

## 2023-08-11 SURGERY — BREAST LUMPECTOMY WITH RADIOACTIVE SEED LOCALIZATION
Anesthesia: General | Site: Breast | Laterality: Right

## 2023-08-11 MED ORDER — CHLORHEXIDINE GLUCONATE CLOTH 2 % EX PADS: 6.0000 | MEDICATED_PAD | Freq: Once | CUTANEOUS | Status: DC

## 2023-08-11 MED ORDER — PROPOFOL 10 MG/ML IV BOLUS
INTRAVENOUS | Status: DC | PRN
  Administered 2023-08-11: 200 mg via INTRAVENOUS

## 2023-08-11 MED ORDER — FENTANYL CITRATE (PF) 250 MCG/5ML IJ SOLN
INTRAMUSCULAR | Status: DC | PRN
  Administered 2023-08-11: 25 ug via INTRAVENOUS

## 2023-08-11 MED ORDER — ACETAMINOPHEN 500 MG PO TABS
1000.0000 mg | ORAL_TABLET | ORAL | Status: AC
  Administered 2023-08-11: 1000 mg via ORAL
  Filled 2023-08-11: qty 2

## 2023-08-11 MED ORDER — BUPIVACAINE-EPINEPHRINE 0.25% -1:200000 IJ SOLN
INTRAMUSCULAR | Status: DC | PRN
  Administered 2023-08-11: 27 mL

## 2023-08-11 MED ORDER — ONDANSETRON HCL 4 MG/2ML IJ SOLN
INTRAMUSCULAR | Status: DC | PRN
  Administered 2023-08-11: 4 mg via INTRAVENOUS

## 2023-08-11 MED ORDER — INSULIN ASPART 100 UNIT/ML IJ SOLN: 0.0000 [IU] | INTRAMUSCULAR | Status: DC | PRN

## 2023-08-11 MED ORDER — PROPOFOL 10 MG/ML IV BOLUS
INTRAVENOUS | Status: AC
  Filled 2023-08-11: qty 20

## 2023-08-11 MED ORDER — LIDOCAINE 2% (20 MG/ML) 5 ML SYRINGE
INTRAMUSCULAR | Status: AC
  Filled 2023-08-11: qty 5

## 2023-08-11 MED ORDER — ONDANSETRON HCL 4 MG/2ML IJ SOLN
INTRAMUSCULAR | Status: AC
  Filled 2023-08-11: qty 2

## 2023-08-11 MED ORDER — CHLORHEXIDINE GLUCONATE 0.12 % MT SOLN: 15.0000 mL | Freq: Once | OROMUCOSAL | Status: AC

## 2023-08-11 MED ORDER — EPHEDRINE SULFATE-NACL 50-0.9 MG/10ML-% IV SOSY
PREFILLED_SYRINGE | INTRAVENOUS | Status: DC | PRN
  Administered 2023-08-11: 5 mg via INTRAVENOUS

## 2023-08-11 MED ORDER — ONDANSETRON HCL 4 MG/2ML IJ SOLN: 4.0000 mg | Freq: Once | INTRAMUSCULAR | Status: DC | PRN

## 2023-08-11 MED ORDER — OXYCODONE HCL 5 MG PO TABS: 5.0000 mg | ORAL_TABLET | Freq: Four times a day (QID) | ORAL | 0 refills | Status: DC | PRN

## 2023-08-11 MED ORDER — SODIUM CHLORIDE 0.9 % IV SOLN
3.0000 g | INTRAVENOUS | Status: AC
  Administered 2023-08-11: 3 g via INTRAVENOUS
  Filled 2023-08-11: qty 3

## 2023-08-11 MED ORDER — 0.9 % SODIUM CHLORIDE (POUR BTL) OPTIME
TOPICAL | Status: DC | PRN
  Administered 2023-08-11: 1000 mL

## 2023-08-11 MED ORDER — BUPIVACAINE-EPINEPHRINE (PF) 0.25% -1:200000 IJ SOLN
INTRAMUSCULAR | Status: AC
  Filled 2023-08-11: qty 30

## 2023-08-11 MED ORDER — AMISULPRIDE (ANTIEMETIC) 5 MG/2ML IV SOLN: 10.0000 mg | Freq: Once | INTRAVENOUS | Status: DC | PRN

## 2023-08-11 MED ORDER — FENTANYL CITRATE (PF) 250 MCG/5ML IJ SOLN
INTRAMUSCULAR | Status: AC
  Filled 2023-08-11: qty 5

## 2023-08-11 MED ORDER — EPHEDRINE 5 MG/ML INJ
INTRAVENOUS | Status: AC
  Filled 2023-08-11: qty 5

## 2023-08-11 MED ORDER — HYDROMORPHONE HCL 1 MG/ML IJ SOLN
0.2500 mg | INTRAMUSCULAR | Status: DC | PRN
Start: 2023-08-11 — End: 2023-08-11

## 2023-08-11 MED ORDER — PHENYLEPHRINE 80 MCG/ML (10ML) SYRINGE FOR IV PUSH (FOR BLOOD PRESSURE SUPPORT)
PREFILLED_SYRINGE | INTRAVENOUS | Status: AC
  Filled 2023-08-11: qty 10

## 2023-08-11 MED ORDER — DEXAMETHASONE SODIUM PHOSPHATE 10 MG/ML IJ SOLN
INTRAMUSCULAR | Status: AC
  Filled 2023-08-11: qty 1

## 2023-08-11 MED ORDER — ORAL CARE MOUTH RINSE: 15.0000 mL | Freq: Once | OROMUCOSAL | Status: AC

## 2023-08-11 MED ORDER — DEXAMETHASONE SODIUM PHOSPHATE 10 MG/ML IJ SOLN
INTRAMUSCULAR | Status: DC | PRN
  Administered 2023-08-11: 5 mg via INTRAVENOUS

## 2023-08-11 MED ORDER — SODIUM CHLORIDE 0.9 % IV SOLN: INTRAVENOUS | Status: DC

## 2023-08-11 MED ORDER — GABAPENTIN 300 MG PO CAPS
300.0000 mg | ORAL_CAPSULE | ORAL | Status: AC
  Administered 2023-08-11: 300 mg via ORAL
  Filled 2023-08-11: qty 1

## 2023-08-11 MED ORDER — OXYCODONE HCL 5 MG/5ML PO SOLN: 5.0000 mg | Freq: Once | ORAL | Status: DC | PRN

## 2023-08-11 MED ORDER — LIDOCAINE 2% (20 MG/ML) 5 ML SYRINGE
INTRAMUSCULAR | Status: DC | PRN
  Administered 2023-08-11: 50 mg via INTRAVENOUS

## 2023-08-11 MED ORDER — CHLORHEXIDINE GLUCONATE 0.12 % MT SOLN
OROMUCOSAL | Status: AC
  Administered 2023-08-11: 15 mL via OROMUCOSAL
  Filled 2023-08-11: qty 15

## 2023-08-11 MED ORDER — OXYCODONE HCL 5 MG PO TABS: 5.0000 mg | ORAL_TABLET | Freq: Once | ORAL | Status: DC | PRN

## 2023-08-11 SURGICAL SUPPLY — 29 items
APPLIER CLIP 11 MED OPEN (CLIP) ×1 IMPLANT
BAG COUNTER SPONGE SURGICOUNT (BAG) ×1 IMPLANT
CANISTER SUCT 3000ML PPV (MISCELLANEOUS) IMPLANT
CHLORAPREP W/TINT 26 (MISCELLANEOUS) ×1 IMPLANT
CLIP APPLIE 11 MED OPEN (CLIP) IMPLANT
COVER PROBE W GEL 5X96 (DRAPES) ×1 IMPLANT
COVER SURGICAL LIGHT HANDLE (MISCELLANEOUS) ×1 IMPLANT
DERMABOND ADVANCED .7 DNX12 (GAUZE/BANDAGES/DRESSINGS) ×1 IMPLANT
DEVICE DUBIN SPECIMEN MAMMOGRA (MISCELLANEOUS) ×1 IMPLANT
DRAPE CHEST BREAST 15X10 FENES (DRAPES) ×1 IMPLANT
ELECT CAUTERY BLADE 6.4 (BLADE) ×1 IMPLANT
ELECT REM PT RETURN 9FT ADLT (ELECTROSURGICAL) ×1 IMPLANT
ELECTRODE REM PT RTRN 9FT ADLT (ELECTROSURGICAL) ×1 IMPLANT
GAUZE PAD ABD 8X10 STRL (GAUZE/BANDAGES/DRESSINGS) ×1 IMPLANT
GLOVE BIO SURGEON STRL SZ8 (GLOVE) ×1 IMPLANT
GLOVE BIOGEL PI IND STRL 8 (GLOVE) ×1 IMPLANT
GOWN STRL REUS W/ TWL LRG LVL3 (GOWN DISPOSABLE) ×1 IMPLANT
GOWN STRL REUS W/ TWL XL LVL3 (GOWN DISPOSABLE) ×1 IMPLANT
KIT BASIN OR (CUSTOM PROCEDURE TRAY) ×1 IMPLANT
KIT MARKER MARGIN INK (KITS) ×1 IMPLANT
LIGHT WAVEGUIDE WIDE FLAT (MISCELLANEOUS) IMPLANT
NDL HYPO 25GX1X1/2 BEV (NEEDLE) ×1 IMPLANT
NEEDLE HYPO 25GX1X1/2 BEV (NEEDLE) ×1 IMPLANT
NS IRRIG 1000ML POUR BTL (IV SOLUTION) IMPLANT
PACK GENERAL/GYN (CUSTOM PROCEDURE TRAY) ×1 IMPLANT
SUT MNCRL AB 4-0 PS2 18 (SUTURE) ×1 IMPLANT
SUT VIC AB 3-0 SH 8-18 (SUTURE) ×1 IMPLANT
SYR CONTROL 10ML LL (SYRINGE) ×1 IMPLANT
TOWEL GREEN STERILE FF (TOWEL DISPOSABLE) IMPLANT

## 2023-08-11 NOTE — Transfer of Care (Signed)
Immediate Anesthesia Transfer of Care Note  Patient: Miranda Marquez  Procedure(s) Performed: RIGHT BREAST SEED LUMPECTOMY (Right: Breast)  Patient Location: PACU  Anesthesia Type:General  Level of Consciousness: awake, alert , and oriented  Airway & Oxygen Therapy: Patient Spontanous Breathing  Post-op Assessment: Report given to RN and Post -op Vital signs reviewed and stable  Post vital signs: Reviewed and stable  Last Vitals:  Vitals Value Taken Time  BP 134/74 08/11/23 1728  Temp    Pulse 73 08/11/23 1730  Resp 9 08/11/23 1730  SpO2 94 % 08/11/23 1730  Vitals shown include unfiled device data.  Last Pain:  Vitals:   08/11/23 1331  TempSrc: Oral  PainSc: 0-No pain         Complications: No notable events documented.

## 2023-08-11 NOTE — Op Note (Signed)
Preoperative diagnosis: Right breast DCIS lower inner quadrant  Postoperative diagnosis: Same  Procedure: Right breast seed localized ectomy  Surgeon: Harriette Bouillon, MD  Anesthesia: LMA with 0.25% Marcaine with epinephrine  EBL: Minimal  Specimen: Right breast tissue with seed and clip verified by Faxitron  Drains: None  Indications for procedure: The patient is a 62 year old female with DCIS right breast.  She is opted for breast conserving surgery presents today for lumpectomy.The procedure has been discussed with the patient. Alternatives to surgery have been discussed with the patient.  Risks of surgery include bleeding,  Infection,  Seroma formation, death,  and the need for further surgery.   The patient understands and wishes to proceed.   Description of procedure: The patient was met in the holding area questions were answered.  A seed was placed as an outpatient.  The right breast was marked as the correct site.  She was taken back to the operating room placed upon upon the operative table.  After induction of general anesthesia, the right breast was prepped and draped in sterile fashion and timeout performed.  Neoprobe used identify the seed which was in the medial right breast.  Transverse incision was made over the signal.  Dissection was carried down all tissue around the seed and clip were excised with a grossly negative margin.  This was taken down to the pectoralis fashion.  Anterior margin and skin.  Imaging revealed the seed and clip to be in the specimen this was sent on to pathology after orientation with ink.  The cavity is irrigated.  Clips were used to mark the cavity.  Local anesthetic was demonstrated throughout.  Deep tissue planes were approximated with 3-0 Vicryl.  4 Monocryl used to close skin in a subcuticular fashion.  Dermabond applied.  All counts found to be correct.  Breast binder placed.  The patient was awoke extubated taken to recovery in satisfactory  condition.

## 2023-08-11 NOTE — Discharge Instructions (Signed)

## 2023-08-11 NOTE — Interval H&P Note (Signed)
History and Physical Interval Note:  08/11/2023 2:49 PM  Miranda Marquez  has presented today for surgery, with the diagnosis of RIGHT BREAST DUCTAL CARCINOMA.  The various methods of treatment have been discussed with the patient and family. After consideration of risks, benefits and other options for treatment, the patient has consented to  Procedure(s): RIGHT BREAST SEED LUMPECTOMY (Right) as a surgical intervention.  The patient's history has been reviewed, patient examined, no change in status, stable for surgery.  I have reviewed the patient's chart and labs.  Questions were answered to the patient's satisfaction.   The procedure has been discussed with the patient. Alternatives to surgery have been discussed with the patient.  Risks of surgery include bleeding,  Infection,  Seroma formation, death,  and the need for further surgery.   The patient understands and wishes to proceed.   Olyn Landstrom A Ricco Dershem

## 2023-08-11 NOTE — Anesthesia Procedure Notes (Signed)
Procedure Name: LMA Insertion Date/Time: 08/11/2023 4:42 PM  Performed by: Alwyn Ren, CRNAPre-anesthesia Checklist: Patient identified, Emergency Drugs available, Suction available, Timeout performed and Patient being monitored Patient Re-evaluated:Patient Re-evaluated prior to induction Oxygen Delivery Method: Circle system utilized Preoxygenation: Pre-oxygenation with 100% oxygen Induction Type: IV induction Ventilation: Mask ventilation without difficulty LMA: LMA inserted LMA Size: 4.0 Tube type: Oral Placement Confirmation: positive ETCO2, CO2 detector and breath sounds checked- equal and bilateral Tube secured with: Tape Dental Injury: Teeth and Oropharynx as per pre-operative assessment

## 2023-08-11 NOTE — Anesthesia Postprocedure Evaluation (Signed)
Anesthesia Post Note  Patient: Miranda Marquez  Procedure(s) Performed: RIGHT BREAST SEED LUMPECTOMY (Right: Breast)     Patient location during evaluation: PACU Anesthesia Type: General Level of consciousness: awake and alert Pain management: pain level controlled Vital Signs Assessment: post-procedure vital signs reviewed and stable Respiratory status: spontaneous breathing, nonlabored ventilation, respiratory function stable and patient connected to nasal cannula oxygen Cardiovascular status: blood pressure returned to baseline and stable Postop Assessment: no apparent nausea or vomiting Anesthetic complications: no  No notable events documented.  Last Vitals:  Vitals:   08/11/23 1730 08/11/23 1745  BP: 126/83 129/75  Pulse: 73 69  Resp: 12 18  Temp:    SpO2: 94% 92%    Last Pain:  Vitals:   08/11/23 1728  TempSrc:   PainSc: 0-No pain                 Shelton Silvas

## 2023-08-11 NOTE — Anesthesia Preprocedure Evaluation (Addendum)
Anesthesia Evaluation  Patient identified by MRN, date of birth, ID band Patient awake    Reviewed: Allergy & Precautions, NPO status , Patient's Chart, lab work & pertinent test results  Airway Mallampati: I  TM Distance: >3 FB Neck ROM: Full    Dental  (+) Teeth Intact, Dental Advisory Given   Pulmonary sleep apnea , former smoker   breath sounds clear to auscultation       Cardiovascular hypertension, Pt. on medications  Rhythm:Regular Rate:Normal  Echo 2022  1. Prominent pulmonary vein a wave reversal (elevated LVEDP with normal  mean LA pressure). Left ventricular ejection fraction, by estimation, is  60 to 65%. Left ventricular ejection fraction by 3D volume is 62 %. The  left ventricle has normal function.  The left ventricle has no regional wall motion abnormalities. There is  mild concentric left ventricular hypertrophy. Left ventricular diastolic  parameters are consistent with Grade I diastolic dysfunction (impaired  relaxation). Elevated left ventricular  end-diastolic pressure. The E/e' is 9.3. The average left ventricular  global longitudinal strain is -21.3 %. The global longitudinal strain is  normal.   2. Right ventricular systolic function is normal. The right ventricular  size is normal.   3. Left atrial size was moderately dilated.   4. The mitral valve is normal in structure. Trivial mitral valve  regurgitation. No evidence of mitral stenosis.   5. The aortic valve is normal in structure. Aortic valve regurgitation is  not visualized. No aortic stenosis is present.   6. The inferior vena cava is normal in size with greater than 50%  respiratory variability, suggesting right atrial pressure of 3 mmHg.     Neuro/Psych negative neurological ROS  negative psych ROS   GI/Hepatic negative GI ROS, Neg liver ROS,,,  Endo/Other  diabetes, Well Controlled, Type 2, Oral Hypoglycemic Agents, Insulin Dependent   Class 3 obesityBMI 41  Renal/GU negative Renal ROS  negative genitourinary   Musculoskeletal  (+) Arthritis , Osteoarthritis,    Abdominal  (+) + obese  Peds  Hematology negative hematology ROS (+) Hb 13.9   Anesthesia Other Findings Mounjaro LD:   Reproductive/Obstetrics negative OB ROS                             Anesthesia Physical Anesthesia Plan  ASA: 3  Anesthesia Plan: General   Post-op Pain Management: Tylenol PO (pre-op)*   Induction: Intravenous  PONV Risk Score and Plan: 3 and Ondansetron, Dexamethasone, Midazolam and Treatment may vary due to age or medical condition  Airway Management Planned: LMA  Additional Equipment: None  Intra-op Plan:   Post-operative Plan: Extubation in OR  Informed Consent: I have reviewed the patients History and Physical, chart, labs and discussed the procedure including the risks, benefits and alternatives for the proposed anesthesia with the patient or authorized representative who has indicated his/her understanding and acceptance.     Dental advisory given  Plan Discussed with:   Anesthesia Plan Comments:        Anesthesia Quick Evaluation

## 2023-08-11 NOTE — H&P (Signed)
History of Present Illness: Miranda Marquez is a 62 y.o. female who is seen today as an office consultation for evaluation of New Consultation (Breast Cancer)  Patient presents to the [facility] for evaluation of newly diagnosed right breast DCIS. She had a 7 mm cluster of right breast microcalcifications core biopsy proven to be intermediate grade DCIS ER positive, PR positive. No complaints of breast pain, breast mass nor nipple discharge.  Review of Systems: A complete review of systems was obtained from the patient. I have reviewed this information and discussed as appropriate with the patient. See HPI as well for other ROS.    Medical History: Past Medical History:  Diagnosis Date  Diabetes mellitus without complication (CMS/HHS-HCC)  Glaucoma (increased eye pressure)  History of cancer  Hyperlipidemia   There is no problem list on file for this patient.  Past Surgical History:  Procedure Laterality Date  MASTECTOMY PARTIAL / LUMPECTOMY 08/07/2015  JOINT REPLACEMENT    Allergies  Allergen Reactions  Adhesive Tape-Silicones Rash  Latex Rash   Current Outpatient Medications on File Prior to Visit  Medication Sig Dispense Refill  diclofenac (VOLTAREN) 75 MG EC tablet Take 1 tablet by mouth 2 (two) times daily  hydroCHLOROthiazide (HYDRODIURIL) 25 MG tablet Take 1 tablet by mouth once daily  LANTUS SOLOSTAR U-100 INSULIN pen injector (concentration 100 units/mL) INJECT 10 UNITS INTO THE SKIN DAILY  latanoprost (XALATAN) 0.005 % ophthalmic solution Apply 1 drop to eye at bedtime  metFORMIN (GLUCOPHAGE-XR) 500 MG XR tablet TAKE 2 TABLETS (1,000 MG TOTAL) BY MOUTH IN THE MORNING AND AT BEDTIME.  MOUNJARO 2.5 mg/0.5 mL pen injector INJECT 2.5 MG SUBCUTANEOUSLY WEEKLY  rosuvastatin (CRESTOR) 20 MG tablet Take 1 tablet by mouth once daily  valsartan (DIOVAN) 320 MG tablet Take 1 tablet by mouth once daily   No current facility-administered medications on file prior to visit.   Family  History  Problem Relation Age of Onset  Hyperlipidemia (Elevated cholesterol) Mother  Skin cancer Father  Diabetes Father    Social History   Tobacco Use  Smoking Status Former  Types: Cigarettes  Smokeless Tobacco Former    Social History   Socioeconomic History  Marital status: Married  Tobacco Use  Smoking status: Former  Types: Cigarettes  Smokeless tobacco: Former  Advertising account planner status: Never Used  Substance and Sexual Activity  Alcohol use: Yes  Drug use: Never   Social Drivers of Architectural technologist Insecurity: Low Risk (12/27/2022)  Received from [facility]  Hunger Vital Sign  Worried About Running Out of Food in the Last Year: Never true  Ran Out of Food in the Last Year: Never true  Transportation Needs: No Transportation Needs (12/27/2022)  Received from [employer]  In the past 12 months, has lack of reliable transportation kept you from medical appointments, meetings, work or from getting things needed for daily living? : No  Housing Stability: Low Risk (12/27/2022)  Received from [employer] Stability Vital Sign  What is your living situation today?: I have a steady place to live  Think about the place you live. Do you have problems with any of the following? Choose all that apply:: None/None on this list   Objective:   Vitals:  08/03/23 0823  PainSc: 0-No pain   There is no height or weight on file to calculate BMI.  Physical Exam Exam conducted with a chaperone present.  Cardiovascular:  Rate and Rhythm: Normal rate.  Pulmonary:  Effort: Pulmonary effort is normal.  Chest:  Breasts: Right: Normal. No mass or nipple discharge.  Left: Normal. No mass or nipple discharge.  Musculoskeletal:  General: Normal range of motion.  Lymphadenopathy:  Upper Body:  Right upper body: No supraclavicular or axillary adenopathy.  Left upper body: No supraclavicular or axillary adenopathy.  Skin: General: Skin is warm.   Neurological:  General: No focal deficit present.  Mental Status: She is alert.  Psychiatric:  Mood and Affect: Mood normal.     Labs, Imaging and Diagnostic Testing: ACR Breast Density Category b: There are scattered areas of  fibroglandular density.   FINDINGS:  Spot compression magnification images over the medial posterior  right breast demonstrates a persistent 7 mm group of amorphous  calcifications.   IMPRESSION:  Indeterminate 7 mm group of amorphous calcifications in the medial  posterior right breast.   RECOMMENDATION:  Stereotactic biopsy is recommended. We will call the patient to  schedule the procedure at her earliest convenience.   I have discussed the findings and recommendations with the patient.  If applicable, a reminder letter will be sent to the patient  regarding the next appointment.   BI-RADS CATEGORY 4: Suspicious.  FINAL DIAGNOSIS   1. Breast, right, needle core biopsy, inner :  - DUCTAL CARCINOMA IN SITU, INTERMEDIATE GRADE  - NECROSIS: NOT IDENTIFIED  - CALCIFICATIONS: PRESENT  - DCIS LENGTH: 0.3 CM   NOTE:  DR. Kenyon Ana REVIEWED THE CASE AND CONCURS WITH THE INTERPRETATION. A BREAST  PROGNOSTIC PROFILE (ER AND PR) IS PENDING AND WILL BE REPORTED IN AN ADDENDUM.  THE [facility] [place] WAS NOTIFIED ON 07/27/2023.   DATE SIGNED OUT: 07/27/2023  ELECTRONIC SIGNATURE [place] Callas M.D., Nupur, Pathologist, Electronic Signature   MICROSCOPIC DESCRIPTION   CASE COMMENTS  STAINS USED IN DIAGNOSIS:  H&E-2  H&E-3  H&E-4  H&E  *RECUT 1 SLIDE  Stains used in diagnosis 1 ER-ACIS, 1 PR-ACIS  Estrogen receptor (6F11), immunohistochemical stains are performed on formalin  fixed, paraffin embedded tissue using a 3,3"-diaminobenzidine (DAB) chromogen  and Leica Bond Autostainer System. The staining intensity of the nucleus is  scored manually and is reported as the percentage of tumor cell nuclei  demonstrating specific nuclear  staining.Specimens are fixed in 10% Neutral  Buffered Formalin for at least 6 hours and up to 72 hours. These tests have not  be validated on decalcified tissue. Results should be interpreted with caution  given the possibility of false negative results on decalcified specimens.  PR progesterone receptor (16), immunohistochemical stains are performed on  formalin fixed, paraffin embedded tissue using a 3,3"-diaminobenzidine (DAB)  chromogen and Leica Bond Autostainer System. The staining intensity of the  nucleus is scored manually and is reported as the percentage of tumor cell  nuclei demonstrating specific nuclear staining.Specimens are fixed in 10%  Neutral Buffered Formalin for at least 6 hours and up to 72 hours. These tests  have not be validated on decalcified tissue. Results should be interpreted with  caution given the possibility of false negative results on decalcified  specimens.   ADDENDUM  Breast, right, needle core biopsy, inner  PROGNOSTIC INDICATORS   Results:  IMMUNOHISTOCHEMICAL AND MORPHOMETRIC ANALYSIS PERFORMED MANUALLY  Estrogen Receptor: 100%, POSITIVE, STRONG STAINING INTENSITY  Progesterone Receptor: 100%, POSITIVE, STRONG STAINING INTENSITY  REFERENCE RANGE ESTROGEN RECEPTOR  NEGATIVE 0%  POSITIVE =>1%  REFERENCE RANGE PROGESTERONE RECEPTOR  NEGATIVE 0%  POSITIVE =>1%  All controls stained appropriately  Lance Coon Md, Pathologist, Electronic  Signature  ( Signed 12 06 2024)   CLINICAL HISTORY   SPECIMEN(S) OBTAINED  1. Breast, right, needle core biopsy, Inner   SPECIMEN COMMENTS:  1. TIF: 2:00 pm, CIT: < 5 minutes, 62 YO F with 0.7 cm group of calcifications  SPECIMEN CLINICAL INFORMATION:   Gross Description  1. Received in formalin labeled with the patient's name and "right breast calcs  inner" is a 1.8 x 1.2 x 0.2 cm aggregate of fibroadipose tissue. The specimen  is entirely submitted in one block.  Time in formalin 2:00 p.m., CIT less than  5 minutes. (KW:kh 07/27/23)   Assessment and Plan:   Diagnoses and all orders for this visit:  Ductal carcinoma in situ (DCIS) of right breast   Discussed breast conserving surgery versus mastectomy with reconstruction. Local regional recurrence, long-term survival and quality of life issues reviewed. She is opted for right breast seed localized lumpectomy. Risks and benefits reviewed. Risk of bleeding, infection, cosmetic deformity, wound complications, the need for additional surgery, and the need for lymph node surgery were reviewed. She agrees to proceed with right breast seed localized lumpectomy.   Hayden Rasmussen, MD

## 2023-08-12 ENCOUNTER — Encounter (HOSPITAL_COMMUNITY): Payer: Self-pay | Admitting: Surgery

## 2023-08-16 ENCOUNTER — Other Ambulatory Visit: Payer: Self-pay

## 2023-08-16 LAB — SURGICAL PATHOLOGY

## 2023-08-16 MED ORDER — VALSARTAN 320 MG PO TABS: 320.0000 mg | ORAL_TABLET | Freq: Every day | ORAL | 3 refills | Status: DC

## 2023-08-19 ENCOUNTER — Ambulatory Visit: Payer: Self-pay | Admitting: Genetic Counselor

## 2023-08-19 ENCOUNTER — Encounter: Payer: Self-pay | Admitting: Genetic Counselor

## 2023-08-19 ENCOUNTER — Telehealth: Payer: Self-pay | Admitting: Genetic Counselor

## 2023-08-19 DIAGNOSIS — Z803 Family history of malignant neoplasm of breast: Secondary | ICD-10-CM

## 2023-08-19 DIAGNOSIS — D0511 Intraductal carcinoma in situ of right breast: Secondary | ICD-10-CM

## 2023-08-19 DIAGNOSIS — Z1379 Encounter for other screening for genetic and chromosomal anomalies: Secondary | ICD-10-CM

## 2023-08-19 DIAGNOSIS — Z8042 Family history of malignant neoplasm of prostate: Secondary | ICD-10-CM

## 2023-08-19 DIAGNOSIS — Z808 Family history of malignant neoplasm of other organs or systems: Secondary | ICD-10-CM

## 2023-08-19 NOTE — Telephone Encounter (Signed)
Disclosed negative genetics.    

## 2023-08-19 NOTE — Telephone Encounter (Signed)
Contacted patient in attempt to disclose results of genetic testing.  LVM with contact information requesting a call back.  

## 2023-08-22 ENCOUNTER — Encounter: Payer: Self-pay | Admitting: Genetic Counselor

## 2023-08-22 NOTE — Progress Notes (Signed)
HPI:   Ms. Prindiville was previously seen in the [facility] due to a personal history of breast cancer and concerns regarding a hereditary predisposition to cancer.    Ms. Toren recent genetic test results were disclosed to her by telephone. These results and recommendations are discussed in more detail below.  CANCER HISTORY:  Oncology History  Ductal carcinoma in situ (DCIS) of right breast  08/01/2023 Initial Diagnosis   Ductal carcinoma in situ (DCIS) of right breast   08/03/2023 Cancer Staging   Staging form: Breast, AJCC 8th Edition - Clinical stage from 08/03/2023: Stage 0 (cTis (DCIS), cN0, cM0, ER+, PR+) - Signed by Serena Croissant, MD on 08/03/2023 Stage prefix: Initial diagnosis Nuclear grade: G2   08/13/2023 Genetic Testing   Negative [facility] CustomNext-Cancer +RNAinsight Panel.  Report date is 08/13/2023.   The CustomNext-Cancer+RNAinsight panel offered by [facility] includes sequencing, rearrangement, and RNA analysis for the following 56 genes: AIP, ALK, APC, ATM, BAP1, BARD1, BMPR1A, BRCA1, BRCA2, BRIP1, CDH1, CDK4, CDKN1B, CDKN2A, CHEK2, DICER1, FH, FLCN, MEN1, MET, MLH1, MSH2, MSH6, MUTYH, NF1, NF2, NTHL1, PALB2, PHOX2B, PMS2, POT1, PRKAR1A, PTCH1, PTEN, RAD51C, RAD51D, RB1, SMAD4, SMARCA4, SMARCB1, SMARCE1, STK11, SUFU, TP53, TSC1, TSC2 and VHL (sequencing and deletion/duplication); AXIN2, HOXB13, MBD4, MITF, MSH3, POLD1 and POLE (sequencing only); EPCAM and GREM1 (deletion/duplication only).     FAMILY HISTORY:  We obtained a detailed, 4-generation family history.  Significant diagnoses are listed below: Family History  Problem Relation Age of Onset   Melanoma Father 50       arm   Hypertension Father    Prostate cancer Father 24       mets in 90s   Breast cancer Paternal Aunt 6   Lung cancer Maternal Grandfather 22   Melanoma Paternal Grandfather 46       mets   Prostate cancer Cousin 44   Other Daughter 2       optic glioma       GENETIC TEST RESULTS:  The [facility] CustomNext-Cancer +RNAinsight Panel found no pathogenic mutations.   The CustomNext-Cancer+RNAinsight panel (CancerNext + melanoma +CNS genes) offered by [facility] includes sequencing, rearrangement, and RNA analysis for the following 56 genes: : AIP, ALK, APC, ATM, BAP1, BARD1, BMPR1A, BRCA1, BRCA2, BRIP1, CDH1, CDK4, CDKN1B, CDKN2A, CHEK2, DICER1, FH, FLCN, MEN1, MET, MLH1, MSH2, MSH6, MUTYH, NF1, NF2, NTHL1, PALB2, PHOX2B, PMS2, POT1, PRKAR1A, PTCH1, PTEN, RAD51C, RAD51D, RB1, SMAD4, SMARCA4, SMARCB1, SMARCE1, STK11, SUFU, TP53, TSC1, TSC2 and VHL (sequencing and deletion/duplication); AXIN2, HOXB13, MBD4, MITF, MSH3, POLD1 and POLE (sequencing only); EPCAM and GREM1 (deletion/duplication only).   The test report has been scanned into EPIC and is located under the Molecular Pathology section of the Results Review tab.  A portion of the result report is included below for reference. Genetic testing reported out on August 13, 2023.      Even though a pathogenic variant was not identified, possible explanations for the cancer in the family may include: There may be no hereditary risk for cancer in the family. The cancers in Ms. Havins and/or her family may be sporadic/familial or due to other genetic and environmental factors.  Most cancer is not hereditary.  There may be a gene mutation in one of these genes that current testing methods cannot detect but that chance is small. There could be another gene that has not yet been discovered, or that we have not yet tested, that is responsible for the cancer diagnoses in the family.  It is also possible there is a hereditary cause for the cancer in the family that Ms. Shear did not inherit.   Therefore, it is important to remain in touch with cancer genetics in the future so that we can continue to offer Ms. Perkett the most up to date genetic testing.     ADDITIONAL GENETIC TESTING:   Ms. Diner  genetic testing was fairly extensive.  If there are additional relevant genes identified to increase cancer risk that can be analyzed in the future, we would be happy to discuss and coordinate this testing at that time.      CANCER SCREENING RECOMMENDATIONS:  Ms. Cervantes test result is considered negative (normal).  This means that we have not identified a hereditary cause for her personal history of breast cancer at this time.   An individual's cancer risk and medical management are not determined by genetic test results alone. Overall cancer risk assessment incorporates additional factors, including personal medical history, family history, and any available genetic information that may result in a personalized plan for cancer prevention and surveillance. Therefore, it is recommended she continue to follow the cancer management and screening guidelines provided by her oncology and primary healthcare provider.  Based on the reported personal and family history, specific cancer screenings for Ms. [name] include: Speaking with provider about annual full body skin checks  RECOMMENDATIONS FOR FAMILY MEMBERS:   Since she did not inherit a identifiable mutation in a cancer predisposition gene included on this panel, her children could not have inherited a known mutation from her in one of these genes. Individuals in this family might be at some increased risk of developing cancer, over the general population risk, due to the family history of cancer.  Individuals in the family should notify their providers of the family history of cancer. We recommend women in this family have a yearly mammogram beginning at age 69, or 63 years younger than the earliest onset of cancer, an annual clinical breast exam, and perform monthly breast self-exams.  Risk models that take into account family history and hormonal history may be helpful in determining appropriate breast cancer screening options for family  members. Female relatives should speak with their providers about prostate cancer screening.  Paternal relatives should speak with t heir providers about annual full body skin checks.  Other members of the family may still carry a pathogenic variant in one of these genes that Ms. Trabue did not inherit. Based on the family history, we recommend her paternal cousin, who was diagnosed with prostate cancer, have genetic counseling and testing. Ms. Ciarlo can let us know if we can be of any assistance in coordinating genetic counseling and/or testing for this family member.     FOLLOW-UP:  Cancer genetics is a rapidly advancing field and it is possible that new genetic tests will be appropriate for her and/or her family members in the future. We encourage Ms. Landers to remain in contact with cancer genetics, so we can update her personal and family histories and let her know of advances in cancer genetics that may benefit this family.   Our contact number was provided.  They are welcome to call us at anytime with additional questions or concerns.   Ammiel Guiney M. [name], MS, [facility] Genetic Counselor [email or web address] (P) [phone]

## 2023-08-23 ENCOUNTER — Encounter: Payer: Self-pay | Admitting: *Deleted

## 2023-08-25 ENCOUNTER — Encounter: Payer: Self-pay | Admitting: Surgery

## 2023-08-30 ENCOUNTER — Ambulatory Visit: Payer: 59 | Admitting: Internal Medicine

## 2023-08-30 VITALS — Ht 68.5 in | Wt 272.0 lb

## 2023-08-30 DIAGNOSIS — Z6841 Body Mass Index (BMI) 40.0 and over, adult: Secondary | ICD-10-CM

## 2023-08-30 DIAGNOSIS — Z794 Long term (current) use of insulin: Secondary | ICD-10-CM | POA: Diagnosis not present

## 2023-08-30 DIAGNOSIS — E119 Type 2 diabetes mellitus without complications: Secondary | ICD-10-CM

## 2023-08-30 DIAGNOSIS — R7309 Other abnormal glucose: Secondary | ICD-10-CM

## 2023-08-30 DIAGNOSIS — Z7985 Long-term (current) use of injectable non-insulin antidiabetic drugs: Secondary | ICD-10-CM | POA: Diagnosis not present

## 2023-08-30 DIAGNOSIS — Z7984 Long term (current) use of oral hypoglycemic drugs: Secondary | ICD-10-CM

## 2023-08-30 DIAGNOSIS — E785 Hyperlipidemia, unspecified: Secondary | ICD-10-CM

## 2023-08-30 LAB — POCT GLYCOSYLATED HEMOGLOBIN (HGB A1C): Hemoglobin A1C: 6.5 % — AB (ref 4.0–5.6)

## 2023-08-30 LAB — POCT GLUCOSE (DEVICE FOR HOME USE): POC Glucose: 141 mg/dL — AB (ref 70–99)

## 2023-08-30 MED ORDER — ROSUVASTATIN CALCIUM 20 MG PO TABS: 20.0000 mg | ORAL_TABLET | Freq: Every day | ORAL | 3 refills | Status: DC

## 2023-08-30 NOTE — Patient Instructions (Addendum)
-   Continue  Mounjaro  2.5 mg weekly - Continue Lantus  10 units daily  - Continue Metformin  500 mg TWO tablets Twice daily  - Continue  Farxiga  10 mg, 1 tablet daily in the morning     HOW TO TREAT LOW BLOOD SUGARS (Blood sugar LESS THAN 70 MG/DL) Please follow the RULE OF 15 for the treatment of hypoglycemia treatment (when your (blood sugars are less than 70 mg/dL)   STEP 1: Take 15 grams of carbohydrates when your blood sugar is low, which includes:  3-4 GLUCOSE TABS  OR 3-4 OZ OF JUICE OR REGULAR SODA OR ONE TUBE OF GLUCOSE GEL    STEP 2: RECHECK blood sugar in 15 MINUTES STEP 3: If your blood sugar is still low at the 15 minute recheck --> then, go back to STEP 1 and treat AGAIN with another 15 grams of carbohydrates.

## 2023-08-30 NOTE — Progress Notes (Signed)
 Name: Miranda Marquez  Age/ Sex: 63 y.o., female   MRN/ DOB: [id number], 06-Aug-1961     PCP: Lynwood Laneta LELON DEVONNA   Reason for Endocrinology Evaluation: Type 2 Diabetes Mellitus  Initial Endocrine Consultative Visit: 01/16/2021    PATIENT IDENTIFIER: Miranda Marquez is a 63 y.o. female with a past medical history of HTN, T2DM, HTN and dyslipidemia. The patient has followed with Endocrinology clinic since 01/16/2021 for consultative assistance with management of her diabetes.  DIABETIC HISTORY:  Ms. Vondra was diagnosed with DM 07/2020.  She had gestations diabetes > 25 yrs ago. Her hemoglobin A1c has ranged from 10.2% in 2022, peaking at 12.4% in 2021  On her initial visit to our clinic she had an A1c of 10.7% she was not on any medications, we started metformin  and insulin    By 04/2021 Farxiga  started    Intolerant to Ozempic  due to GI issues 2023, she was started by cardiology     PITUITARY HISTORY: She has a diagnosis of pituitary microadenoma in 2006. With an MRI showing a 5 mm adenoma . She has a Hx of hyperprolactinemia . She was on Bromocriptine  until 2021 . Repeat Prolactin was normal x 2 in 2022 and we opted to hold off on Bromocriptine .      SUBJECTIVE:   During the last visit (02/28/2023): A1c 7.0%     Today (08/30/2023): Miranda Marquez  is here for a follow up on diabetes management. She checks her blood sugars 2 times daily. The patient has not had hypoglycemic episodes since the last clinic visit.  She had a follow-up with cardiology for coronary artery calcifications and HTN, on Crestor  She is S/P right breast sx 07/2023 for Breast ca  complicated by an allergic reaction , starting radiation next week   Denies nausea or vomiting  She did have transient right abdominal cramps with mounjaro   Denies constipation      HOME DIABETES REGIMEN:  Metformin  500 mg XR 2 tabs BID  Farxiga  10 mg daily  Lantus  10 units daily      Statin: yes ACE-I/ARB: no Prior Diabetic  Education: no   GLUCOSE LOG:  n/a Memory recall 130's   DIABETIC COMPLICATIONS: Microvascular complications:   Denies: CKD, neuropathy, retinopathy Last Eye Exam: scheduled in 03/2023   Macrovascular complications:   Denies: CAD, CVA, PVD   HISTORY:  Past Medical History:  Past Medical History:  Diagnosis Date   Arthritis    Breast cancer (HCC)    Cervical herniated disc    Complication of anesthesia    hypotension  in april after hysterectomy   Hypertension    not taking meds    Pituitary microadenoma with hyperprolactinemia (HCC)    Psoriasis both elbows, scalp, left thigh, saw dr ivin, last week given cream for areas healing   Sleep apnea    never had study done, but things she does have it   Past Surgical History:  Past Surgical History:  Procedure Laterality Date   ABDOMINAL HYSTERECTOMY     APPENDECTOMY     BREAST BIOPSY Right 10/11/2014   BREAST BIOPSY Right 07/26/2023   MM RT BREAST BX W LOC DEV 1ST LESION IMAGE BX SPEC STEREO GUIDE 07/26/2023 [facility]   BREAST BIOPSY  08/10/2023   MM RT RADIOACTIVE SEED LOC MAMMO GUIDE 08/10/2023 [facility]   BREAST EXCISIONAL BIOPSY Right 07/2015   BREAST EXCISIONAL BIOPSY Right    BREAST LUMPECTOMY WITH RADIOACTIVE SEED LOCALIZATION Right 08/07/2015   Procedure:  RIGHT BREAST LUMPECTOMY WITH RADIOACTIVE SEED LOCALIZATION AND EXCISION OF BREAST MASS X2;  Surgeon: Vicenta Poli, MD;  Location: [facility];  Service: General;  Laterality: Right;   BREAST LUMPECTOMY WITH RADIOACTIVE SEED LOCALIZATION Right 08/11/2023   Procedure: RIGHT BREAST SEED LUMPECTOMY;  Surgeon: Vanderbilt Ned, MD;  Location: [facility] OR;  Service: General;  Laterality: Right;   BREAST SURGERY     DILATION AND CURETTAGE OF UTERUS     FOOT SURGERY     left heel     HERNIA REPAIR     umbilical   MAXIMUM ACCESS (MAS)POSTERIOR LUMBAR INTERBODY FUSION (PLIF) 1 LEVEL N/A 09/09/2016   Procedure: MAXIMUM ACCESS SURGERY POSTERIOR LUMBAR INTERBODY  FUSION LUMBAR THREE-FOUR;  Surgeon: Alm GORMAN Molt, MD;  Location: [facility] OR;  Service: Neurosurgery;  Laterality: N/A;   MINI-LAPAROTOMY W/ TUBAL LIGATION     ROBOTIC ASSISTED TOTAL HYSTERECTOMY WITH BILATERAL SALPINGO OOPHERECTOMY Bilateral 12/03/2014   Procedure: ROBOTIC ASSISTED TOTAL HYSTERECTOMY WITH BILATERAL SALPINGO OOPHORECTOMY;  Surgeon: Maurilio Ship, MD;  Location: [facility];  Service: Gynecology;  Laterality: Bilateral;   TOTAL HIP ARTHROPLASTY Right 10/18/2017   Procedure: RIGHT TOTAL HIP ARTHROPLASTY ANTERIOR APPROACH;  Surgeon: Ernie Cough, MD;  Location: [facility];  Service: Orthopedics;  Laterality: Right;  70 mins   TUBAL LIGATION     Social History:  reports that she quit smoking about 45 years ago. Her smoking use included cigarettes. She has never used smokeless tobacco. She reports current alcohol use of about 2.0 standard drinks of alcohol per week. She reports that she does not use drugs. Family History:  Family History  Problem Relation Age of Onset   Melanoma Father 50       arm   Hypertension Father    Prostate cancer Father 43       mets in 90s   Breast cancer Paternal Aunt 86   Lung cancer Maternal Grandfather 91   Melanoma Paternal Grandfather 94       mets   Prostate cancer Cousin 71   Other Daughter 2       optic glioma     HOME MEDICATIONS: Allergies as of 08/30/2023       Reactions   Nickel Dermatitis   Causes irritation    Other Dermatitis   Steri Strips         Medication List        Accurate as of August 30, 2023  1:29 PM. If you have any questions, ask your nurse or doctor.          STOP taking these medications    oxyCODONE  5 MG immediate release tablet Commonly known as: Oxy IR/ROXICODONE    tirzepatide  2.5 MG/0.5ML Pen Commonly known as: MOUNJARO        TAKE these medications    BD Pen Needle Nano 2nd Gen 32G X 4 MM Misc Generic drug: Insulin  Pen Needle USE AS DIRECTED   dapagliflozin  propanediol 10 MG Tabs tablet Commonly  known as: Farxiga  Take 1 tablet (10 mg total) by mouth daily before breakfast.   diclofenac 75 MG EC tablet Commonly known as: VOLTAREN Take 75 mg by mouth daily as needed for moderate pain (pain score 4-6).   hydrochlorothiazide  25 MG tablet Commonly known as: HYDRODIURIL  Take 1 tablet (25 mg total) by mouth daily.   HYDROcodone -acetaminophen  5-325 MG tablet Commonly known as: NORCO/VICODIN Take 1 tablet by mouth every 4 (four) hours as needed.   Lantus  SoloStar 100 UNIT/ML Solostar Pen Generic drug: insulin  glargine Inject  10 Units into the skin daily. What changed:  how much to take when to take this   latanoprost 0.005 % ophthalmic solution Commonly known as: XALATAN Place 1 drop into both eyes at bedtime.   metFORMIN  500 MG 24 hr tablet Commonly known as: GLUCOPHAGE -XR TAKE 2 TABLETS (1,000 MG TOTAL) BY MOUTH IN THE MORNING AND AT BEDTIME.   OneTouch Verio test strip Generic drug: glucose blood Use as instructed   rosuvastatin  20 MG tablet Commonly known as: CRESTOR  Take 1 tablet (20 mg total) by mouth daily. What changed: when to take this   sulfamethoxazole-trimethoprim 800-160 MG tablet Commonly known as: BACTRIM DS Take by mouth.   triamcinolone ointment 0.1 % Commonly known as: KENALOG Apply topically.   valsartan  320 MG tablet Commonly known as: DIOVAN  Take 1 tablet (320 mg total) by mouth daily.         OBJECTIVE:   Vital Signs: Ht 5' 8.5 (1.74 m)   Wt 272 lb (123.4 kg)   BMI 40.76 kg/m   Wt Readings from Last 3 Encounters:  08/30/23 272 lb (123.4 kg)  08/11/23 265 lb (120.2 kg)  08/05/23 268 lb (121.6 kg)     Exam: General: Pt appears well and is in NAD  Lungs: Clear with good BS bilat   Heart: RRR   Extremities: No pretibial edema.   Neuro: MS is good with appropriate affect, pt is alert and Ox3    DM foot exam: 02/28/2023     The skin of the feet is intact without sores or ulcerations. The pedal pulses are 2+ on right and  2+ on left. The sensation is intact to a screening 5.07, 10 gram monofilament bilaterally      DATA REVIEWED:  Lab Results  Component Value Date   HGBA1C 6.5 (A) 08/30/2023   HGBA1C 7.0 (A) 02/28/2023   HGBA1C 6.6 (A) 03/09/2022       Latest Reference Range & Units 08/03/23 12:26  Sodium 135 - 145 mmol/L 138  Potassium 3.5 - 5.1 mmol/L 3.6  Chloride 98 - 111 mmol/L 102  CO2 22 - 32 mmol/L 27  Glucose 70 - 99 mg/dL 886 (H)  BUN 8 - 23 mg/dL 31 (H)  Creatinine 9.55 - 1.00 mg/dL 9.00  Calcium  8.9 - 10.3 mg/dL 9.7  Anion gap 5 - 15  9  Alkaline Phosphatase 38 - 126 U/L 62  Albumin 3.5 - 5.0 g/dL 4.5  AST 15 - 41 U/L 16  ALT 0 - 44 U/L 21  Total Protein 6.5 - 8.1 g/dL 7.2  Total Bilirubin <8.7 mg/dL 0.7  GFR, Est Non African American >60 mL/min >60      BRAIN MRI 03/23/2005   1. Suspect 5 mm prolactinoma in the right inferior aspect of the gland with mild remodeling of the sellar floor as well as mild deviation of the pituitary stalk from right to left.  2. Otherwise negative cranial MRI  ASSESSMENT / PLAN / RECOMMENDATIONS:   1) Type 2 Diabetes Mellitus, optimally controlled, With out complications - Most recent A1c of 6.5 %. Goal A1c < 7.0 %.    -A1c at goal -She could not tolerate Ozempic  due to bloating and severe constipation -She did well on Mounjaro  2.5 mg weekly, but had to discontinue prior to breast cancer surgery and has not restarted in 3 weeks -No changes at this time, will restart Mounjaro    MEDICATIONS: Continue metformin  500 mg 2 tablets BID Continue  Farxiga  10 mg, 1 tablet daily  in the morning Continue Lantus  10 units daily Continue Mounjaro  2.5 mg weekly  EDUCATION / INSTRUCTIONS: BG monitoring instructions: Patient is instructed to check her blood sugars 1 times a day, Fasting Call [facility] if: BG persistently < 70  I reviewed the Rule of 15 for the treatment of hypoglycemia in detail with the patient. Literature  supplied.   2) Diabetic complications:  Eye: Does not have known diabetic retinopathy.  Neuro/ Feet: Does not have known diabetic peripheral neuropathy .  Renal: Patient does not have known baseline CKD. She   is not on an ACEI/ARB at present.    3) Dyslipidemia:    -She was started on Statin therapy in 12/2020 -She is tolerating this without side effects   Continue Rosuvastatin  20 mg daily       F/U in 4 months   Signed electronically by: Stefano Redgie Butts, MD  [facility] Endocrinology  [facility] [address] [place], [place] [address] Phone: [phone] FAX: [phone]   CC: Lynwood Laneta LELON DEVONNA [address] [place] [address] Phone: [phone]  Fax: [phone]  Return to Endocrinology clinic as below: Future Appointments  Date Time Provider Department Center  09/08/2023 12:30 PM [facility] NURSE [facility] None  09/08/2023  1:00 PM Shannon Lynwood, MD [facility] None  09/08/2023  2:00 PM Shannon Lynwood, MD [facility] None

## 2023-09-02 ENCOUNTER — Ambulatory Visit: Payer: 59 | Admitting: Internal Medicine

## 2023-09-06 ENCOUNTER — Other Ambulatory Visit: Payer: Self-pay | Admitting: Internal Medicine

## 2023-09-07 NOTE — Progress Notes (Addendum)
Radiation Oncology         [phone] ________________________________  Name: Miranda Marquez MRN: [id number]  Date: 09/08/2023  DOB: Dec 31, 1960  Re-Evaluation Note  CC: [name], Elyn Peers, MD    ICD-10-CM   1. Ductal carcinoma in situ (DCIS) of right breast  D05.11       Diagnosis:   Stage 0 (cTis, cN0, cM0) Right Breast DCIS Q, Intermediate DCIS ER+ / PR+ / Her2not assessed, Grade 2 : s/p right breast lumpectomy   Narrative:  The patient returns today to discuss radiation treatment options. She was seen in multidisciplinary breast clinic on 08-03-23. At that time, we discussed that she is a good candidate for breast conservation with radiotherapy to the right breast. We reviewed the general course of radiation, potential side effects, and toxicities with radiation.    Since consultation, she underwent genetic testing on 08-03-23. Results showed no clinically significant variants detected by [facility] testing.  She opted to proceed with a right breast lumpectomy with radioactive seed localization w  nodal biopsy on 08-11-23 under the care of Dr. Harriette Bouillon at [facility]. Pathology from the procedure revealed: Ductal carcinoma in situ, solid type, nuclear grade 2, without necrosis measuring 4 mm in greatest dimension (on one block), negative margins with the closest margin measuring 1.8 cm from lateral margin. Estrogen and progesterone receptor staining 100% positive with strong staining intensity. Other findings included three intraductal papillomas, two measuring 1 mm and one 1.5 mm.  On review of systems, the patient reports to be doing well overall. She denies breast pain, issues with range of motion, lymphedema and any other symptoms.    Allergies:  is allergic to nickel and other.  Meds: Current Outpatient Medications  Medication Sig Dispense Refill   BD PEN NEEDLE NANO 2ND GEN 32G X 4 MM MISC USE AS DIRECTED 100 each 6   dapagliflozin propanediol  (FARXIGA) 10 MG TABS tablet Take 1 tablet (10 mg total) by mouth daily before breakfast. 90 tablet 3   diclofenac (VOLTAREN) 75 MG EC tablet Take 75 mg by mouth daily as needed for moderate pain (pain score 4-6).     glucose blood (ONETOUCH VERIO) test strip Use as instructed 100 each 12   hydrochlorothiazide (HYDRODIURIL) 25 MG tablet Take 1 tablet (25 mg total) by mouth daily. 90 tablet 2   insulin glargine (LANTUS SOLOSTAR) 100 UNIT/ML Solostar Pen INJECT 10 UNITS INTO THE SKIN DAILY 45 mL 2   latanoprost (XALATAN) 0.005 % ophthalmic solution Place 1 drop into both eyes at bedtime.     metFORMIN (GLUCOPHAGE-XR) 500 MG 24 hr tablet TAKE 2 TABLETS (1,000 MG TOTAL) BY MOUTH IN THE MORNING AND AT BEDTIME. 360 tablet 1   rosuvastatin (CRESTOR) 20 MG tablet Take 1 tablet (20 mg total) by mouth daily. 90 tablet 3   valsartan (DIOVAN) 320 MG tablet Take 1 tablet (320 mg total) by mouth daily. 90 tablet 3   No current facility-administered medications for this encounter.    Physical Findings: The patient is in no acute distress. Patient is alert and oriented.  height is 5' 8.5" (1.74 m) and weight is 273 lb 3.2 oz (123.9 kg). Her temperature is 97.8 F (36.6 C). Her blood pressure is 147/92 (abnormal) and her pulse is 80. Her respiration is 20 and oxygen saturation is 99%.  No significant changes. Lungs are clear to auscultation bilaterally. Heart has regular rate and rhythm. No palpable cervical, supraclavicular, or axillary adenopathy.  Abdomen soft, non-tender, normal bowel sounds.  Left Breast: no palpable mass, nipple discharge or bleeding. Right Breast: well healed surgical incision with well approximated skin edges in the ILQ. No signs of infection or delayed healing.   Lab Findings: Lab Results  Component Value Date   WBC 4.5 08/03/2023   HGB 13.9 08/03/2023   HCT 40.4 08/03/2023   MCV 87.1 08/03/2023   PLT 244 08/03/2023    Radiographic Findings: MM Breast Surgical Specimen Result  Date: 08/12/2023 CLINICAL DATA:  Status post seed localization of the RIGHT breast for ductal carcinoma in situ. EXAM: SPECIMEN RADIOGRAPH OF THE RIGHT BREAST COMPARISON:  Previous exam(s). FINDINGS: Status post excision of the right breast. The radioactive seed and X shaped biopsy marker clip are present. IMPRESSION: Specimen radiograph of the RIGHT breast. Electronically Signed   By: Norva Pavlov M.D.   On: 08/12/2023 10:25   MM RT RADIOACTIVE SEED LOC MAMMO GUIDE Result Date: 08/10/2023 CLINICAL DATA:  63 year old with biopsy-proven DCIS involving the inner RIGHT breast at posterior depth (X clip). Radioactive seed localization is performed in anticipation of lumpectomy which is scheduled for tomorrow. EXAM: MAMMOGRAPHIC GUIDED RADIOACTIVE SEED LOCALIZATION OF THE RIGHT BREAST COMPARISON:  Previous exam(s). FINDINGS: Patient presents for radioactive seed localization prior to RIGHT breast lumpectomy. I met with the patient and we discussed the procedure of seed localization including benefits and alternatives. We discussed the high likelihood of a successful procedure. We discussed the risks of the procedure including infection, bleeding, tissue injury and further surgery. We discussed the low dose of radioactivity involved in the procedure. Informed, written consent was given. The usual time-out protocol was performed immediately prior to the procedure. Using mammographic guidance, sterile technique with chlorhexidine as skin antisepsis, 1% lidocaine as local anesthesia, an I-125 radioactive seed was used to localize the X shaped tissue marking clip associated with the biopsy-proven DCIS using a medial approach. The follow-up mammogram images confirm that the seed is appropriately positioned immediately adjacent to the clip. The images are marked for Dr. Luisa Hart. Follow-up survey of the patient confirms the presence of the radioactive seed. Order number of I-125 seed: [id number] Total activity: 0.237 mCi  Reference Date: 07/13/2023 The patient tolerated the procedure well without apparent immediate complications. She was released from the [facility] with instructions regarding seed removal. IMPRESSION: Radioactive seed localization of biopsy-proven DCIS involving the inner RIGHT breast. Electronically Signed   By: Hulan Saas M.D.   On: 08/10/2023 13:50    Impression:  Stage 0 (cTis (DCIS), cN0, cM0) Right  Breast DCIS Q, Intermediate DCIS ER+ / PR+ / Her2not assessed, Grade 2 : s/p right breast lumpectomy   The patient is healing well from her lumpectomy. Dr. Roselind Messier recommends adjuvant radiation to reduce the risk of locoregional disease recurrence. Today we discussed the natural course of early stage breast disease. We highlighted the role of radiotherapy in the management and focused on the details of logistics and delivery. We reviewed the anticipated acute and late sequelae associated with radiation in this setting. The patient was encouraged to ask questions that I answered to the best of my ability. Patient expressed readiness to proceed with treatment. A consent form was reviewed and signed today.    Plan:  Patient is scheduled for CT simulation later today. Will plan to begin radiation approximately one week after simulation. Plan to deliver 42.72 Gy in 16 fractions to the right breast. Due to wide excisional margins we will not follow with a boost.  I personally spent 30 minutes in this encounter including chart review, reviewing radiological studies, meeting face-to-face with the patient, entering orders and completing documentation.  -----------------------------------   Bryan Lemma, PA-C   Vassie Moment as a scribe for Bryan Lemma, PA-C,have documented all relevant documentation on the behalf of Bryan Lemma, PA-C, as directed by Bryan Lemma, PA-C while in the presence of Bryan Lemma, PA-C.   Westley Hummer, PA-C, have reviewed all documentation for this visit. The  documentation on 09/08/23 for the exam, diagnosis, procedures, and orders are all accurate and complete.

## 2023-09-07 NOTE — Progress Notes (Addendum)
Location of Breast Cancer:Right  Right Breast DCIS lower inner quadrant   Histology per Pathology Report:    Receptor Status: ER(100), PR (100), Her2-neu (), Ki-67()  Did patient present with symptoms (if so, please note symptoms) or was this found on screening mammography?: screening mammogram.   Past/Anticipated interventions by surgeon, if ZOX:WRUEA breast lumpectomy    Past/Anticipated interventions by medical oncology : Dr. Pamelia Hoit   Lymphedema issues, if any:  No    Pain issues, if any:  Denies pain to breast.   Skin: Patient reports having blisters to bilateral breast after surgery. Patient thinks she may have had an allergic reaction but unsure if allergen is the surgical glue or stiches.   SAFETY ISSUES: Prior radiation? no Pacemaker/ICD? no Possible current pregnancy?no Is the patient on methotrexate? no  Current Complaints / other details:    BP (!) 147/92 (BP Location: Left Arm, Patient Position: Sitting, Cuff Size: Large)   Pulse 80   Temp 97.8 F (36.6 C)   Resp 20   Ht 5' 8.5" (1.74 m)   Wt 273 lb 3.2 oz (123.9 kg)   SpO2 99%   BMI 40.94 kg/m

## 2023-09-08 ENCOUNTER — Other Ambulatory Visit: Payer: Self-pay

## 2023-09-08 ENCOUNTER — Ambulatory Visit
Admission: RE | Admit: 2023-09-08 | Discharge: 2023-09-08 | Disposition: A | Discharge status: Home / Self Care | Payer: 59 | Source: Ambulatory Visit | Attending: Radiation Oncology | Admitting: Radiation Oncology

## 2023-09-08 ENCOUNTER — Encounter: Payer: Self-pay | Admitting: Radiation Oncology

## 2023-09-08 VITALS — BP 147/92 | HR 80 | Temp 97.8°F | Resp 20 | Ht 68.5 in | Wt 273.2 lb

## 2023-09-08 DIAGNOSIS — Z510 Encounter for antineoplastic radiation therapy: Secondary | ICD-10-CM | POA: Diagnosis present

## 2023-09-08 DIAGNOSIS — D0511 Intraductal carcinoma in situ of right breast: Secondary | ICD-10-CM | POA: Insufficient documentation

## 2023-09-15 ENCOUNTER — Encounter: Payer: Self-pay | Admitting: *Deleted

## 2023-09-15 DIAGNOSIS — D0511 Intraductal carcinoma in situ of right breast: Secondary | ICD-10-CM

## 2023-09-15 NOTE — Telephone Encounter (Signed)
Called patient and left message of schedule appointment post xrt.

## 2023-09-20 DIAGNOSIS — Z510 Encounter for antineoplastic radiation therapy: Secondary | ICD-10-CM | POA: Diagnosis not present

## 2023-09-22 ENCOUNTER — Ambulatory Visit: Payer: 59 | Admitting: Radiation Oncology

## 2023-09-23 ENCOUNTER — Ambulatory Visit: Payer: 59

## 2023-09-26 ENCOUNTER — Other Ambulatory Visit: Payer: Self-pay

## 2023-09-26 ENCOUNTER — Ambulatory Visit
Admission: RE | Admit: 2023-09-26 | Discharge: 2023-09-26 | Disposition: A | Discharge status: Home / Self Care | Payer: 59 | Source: Ambulatory Visit | Attending: Radiation Oncology | Admitting: Radiation Oncology

## 2023-09-26 DIAGNOSIS — Z79810 Long term (current) use of selective estrogen receptor modulators (SERMs): Secondary | ICD-10-CM | POA: Diagnosis not present

## 2023-09-26 DIAGNOSIS — Z923 Personal history of irradiation: Secondary | ICD-10-CM | POA: Diagnosis not present

## 2023-09-26 DIAGNOSIS — D0511 Intraductal carcinoma in situ of right breast: Secondary | ICD-10-CM | POA: Insufficient documentation

## 2023-09-26 LAB — RAD ONC ARIA SESSION SUMMARY
Course Elapsed Days: 0
Plan Fractions Treated to Date: 1
Plan Prescribed Dose Per Fraction: 2.67 Gy
Plan Total Fractions Prescribed: 16
Plan Total Prescribed Dose: 42.72 Gy
Reference Point Dosage Given to Date: 2.67 Gy
Reference Point Session Dosage Given: 2.67 Gy
Session Number: 1

## 2023-09-27 ENCOUNTER — Other Ambulatory Visit: Payer: Self-pay

## 2023-09-27 ENCOUNTER — Ambulatory Visit
Admission: RE | Admit: 2023-09-27 | Discharge: 2023-09-27 | Disposition: A | Discharge status: Home / Self Care | Payer: 59 | Source: Ambulatory Visit | Attending: Radiation Oncology | Admitting: Radiation Oncology

## 2023-09-27 DIAGNOSIS — D0511 Intraductal carcinoma in situ of right breast: Secondary | ICD-10-CM | POA: Diagnosis not present

## 2023-09-27 LAB — RAD ONC ARIA SESSION SUMMARY
Course Elapsed Days: 1
Plan Fractions Treated to Date: 2
Plan Prescribed Dose Per Fraction: 2.67 Gy
Plan Total Fractions Prescribed: 16
Plan Total Prescribed Dose: 42.72 Gy
Reference Point Dosage Given to Date: 5.34 Gy
Reference Point Session Dosage Given: 2.67 Gy
Session Number: 2

## 2023-09-28 ENCOUNTER — Ambulatory Visit
Admission: RE | Admit: 2023-09-28 | Discharge: 2023-09-28 | Disposition: A | Discharge status: Home / Self Care | Payer: 59 | Source: Ambulatory Visit | Attending: Radiation Oncology | Admitting: Radiation Oncology

## 2023-09-28 ENCOUNTER — Other Ambulatory Visit: Payer: Self-pay

## 2023-09-28 DIAGNOSIS — D0511 Intraductal carcinoma in situ of right breast: Secondary | ICD-10-CM | POA: Diagnosis not present

## 2023-09-28 LAB — RAD ONC ARIA SESSION SUMMARY
Course Elapsed Days: 2
Plan Fractions Treated to Date: 3
Plan Prescribed Dose Per Fraction: 2.67 Gy
Plan Total Fractions Prescribed: 16
Plan Total Prescribed Dose: 42.72 Gy
Reference Point Dosage Given to Date: 8.01 Gy
Reference Point Session Dosage Given: 2.67 Gy
Session Number: 3

## 2023-09-29 ENCOUNTER — Ambulatory Visit
Admission: RE | Admit: 2023-09-29 | Discharge: 2023-09-29 | Disposition: A | Discharge status: Home / Self Care | Payer: 59 | Source: Ambulatory Visit | Attending: Radiation Oncology | Admitting: Radiation Oncology

## 2023-09-29 ENCOUNTER — Other Ambulatory Visit: Payer: Self-pay

## 2023-09-29 DIAGNOSIS — D0511 Intraductal carcinoma in situ of right breast: Secondary | ICD-10-CM

## 2023-09-29 LAB — RAD ONC ARIA SESSION SUMMARY
Course Elapsed Days: 3
Plan Fractions Treated to Date: 4
Plan Prescribed Dose Per Fraction: 2.67 Gy
Plan Total Fractions Prescribed: 16
Plan Total Prescribed Dose: 42.72 Gy
Reference Point Dosage Given to Date: 10.68 Gy
Reference Point Session Dosage Given: 2.67 Gy
Session Number: 4

## 2023-09-29 MED ORDER — RADIAPLEXRX EX GEL: Freq: Once | CUTANEOUS | Status: AC

## 2023-09-29 MED ORDER — ALRA NON-METALLIC DEODORANT (RAD-ONC)
1.0000 | Freq: Once | TOPICAL | Status: AC
  Administered 2023-09-29: 1 via TOPICAL

## 2023-09-30 ENCOUNTER — Other Ambulatory Visit: Payer: Self-pay

## 2023-09-30 ENCOUNTER — Ambulatory Visit
Admission: RE | Admit: 2023-09-30 | Discharge: 2023-09-30 | Disposition: A | Discharge status: Home / Self Care | Payer: 59 | Source: Ambulatory Visit | Attending: Radiation Oncology | Admitting: Radiation Oncology

## 2023-09-30 DIAGNOSIS — D0511 Intraductal carcinoma in situ of right breast: Secondary | ICD-10-CM | POA: Diagnosis not present

## 2023-09-30 LAB — RAD ONC ARIA SESSION SUMMARY
Course Elapsed Days: 4
Plan Fractions Treated to Date: 5
Plan Prescribed Dose Per Fraction: 2.67 Gy
Plan Total Fractions Prescribed: 16
Plan Total Prescribed Dose: 42.72 Gy
Reference Point Dosage Given to Date: 13.35 Gy
Reference Point Session Dosage Given: 2.67 Gy
Session Number: 5

## 2023-10-03 ENCOUNTER — Ambulatory Visit
Admission: RE | Admit: 2023-10-03 | Discharge: 2023-10-03 | Disposition: A | Discharge status: Home / Self Care | Payer: 59 | Source: Ambulatory Visit | Attending: Radiation Oncology | Admitting: Radiation Oncology

## 2023-10-03 ENCOUNTER — Other Ambulatory Visit: Payer: Self-pay

## 2023-10-03 DIAGNOSIS — D0511 Intraductal carcinoma in situ of right breast: Secondary | ICD-10-CM | POA: Diagnosis not present

## 2023-10-03 LAB — RAD ONC ARIA SESSION SUMMARY
Course Elapsed Days: 7
Plan Fractions Treated to Date: 6
Plan Prescribed Dose Per Fraction: 2.67 Gy
Plan Total Fractions Prescribed: 16
Plan Total Prescribed Dose: 42.72 Gy
Reference Point Dosage Given to Date: 16.02 Gy
Reference Point Session Dosage Given: 2.67 Gy
Session Number: 6

## 2023-10-04 ENCOUNTER — Other Ambulatory Visit: Payer: Self-pay

## 2023-10-04 ENCOUNTER — Ambulatory Visit
Admission: RE | Admit: 2023-10-04 | Discharge: 2023-10-04 | Disposition: A | Discharge status: Home / Self Care | Payer: 59 | Source: Ambulatory Visit | Attending: Radiation Oncology | Admitting: Radiation Oncology

## 2023-10-04 DIAGNOSIS — D0511 Intraductal carcinoma in situ of right breast: Secondary | ICD-10-CM | POA: Diagnosis not present

## 2023-10-04 LAB — RAD ONC ARIA SESSION SUMMARY
Course Elapsed Days: 8
Plan Fractions Treated to Date: 7
Plan Prescribed Dose Per Fraction: 2.67 Gy
Plan Total Fractions Prescribed: 16
Plan Total Prescribed Dose: 42.72 Gy
Reference Point Dosage Given to Date: 18.69 Gy
Reference Point Session Dosage Given: 2.67 Gy
Session Number: 7

## 2023-10-05 ENCOUNTER — Ambulatory Visit
Admission: RE | Admit: 2023-10-05 | Discharge: 2023-10-05 | Discharge status: Home / Self Care | Payer: 59 | Source: Ambulatory Visit | Attending: Radiation Oncology | Admitting: Radiation Oncology

## 2023-10-05 ENCOUNTER — Other Ambulatory Visit: Payer: Self-pay

## 2023-10-05 ENCOUNTER — Other Ambulatory Visit: Payer: Self-pay | Admitting: Internal Medicine

## 2023-10-05 DIAGNOSIS — D0511 Intraductal carcinoma in situ of right breast: Secondary | ICD-10-CM | POA: Diagnosis not present

## 2023-10-05 LAB — RAD ONC ARIA SESSION SUMMARY
Course Elapsed Days: 9
Plan Fractions Treated to Date: 8
Plan Prescribed Dose Per Fraction: 2.67 Gy
Plan Total Fractions Prescribed: 16
Plan Total Prescribed Dose: 42.72 Gy
Reference Point Dosage Given to Date: 21.36 Gy
Reference Point Session Dosage Given: 2.67 Gy
Session Number: 8

## 2023-10-06 ENCOUNTER — Other Ambulatory Visit: Payer: Self-pay

## 2023-10-06 ENCOUNTER — Ambulatory Visit
Admission: RE | Admit: 2023-10-06 | Discharge: 2023-10-06 | Disposition: A | Discharge status: Home / Self Care | Payer: 59 | Source: Ambulatory Visit | Attending: Radiation Oncology | Admitting: Radiation Oncology

## 2023-10-06 DIAGNOSIS — D0511 Intraductal carcinoma in situ of right breast: Secondary | ICD-10-CM | POA: Diagnosis not present

## 2023-10-06 LAB — RAD ONC ARIA SESSION SUMMARY
Course Elapsed Days: 10
Plan Fractions Treated to Date: 9
Plan Prescribed Dose Per Fraction: 2.67 Gy
Plan Total Fractions Prescribed: 16
Plan Total Prescribed Dose: 42.72 Gy
Reference Point Dosage Given to Date: 24.03 Gy
Reference Point Session Dosage Given: 2.67 Gy
Session Number: 9

## 2023-10-07 ENCOUNTER — Other Ambulatory Visit: Payer: Self-pay

## 2023-10-07 ENCOUNTER — Ambulatory Visit
Admission: RE | Admit: 2023-10-07 | Discharge: 2023-10-07 | Disposition: A | Discharge status: Home / Self Care | Payer: 59 | Source: Ambulatory Visit | Attending: Radiation Oncology | Admitting: Radiation Oncology

## 2023-10-07 DIAGNOSIS — Z79899 Other long term (current) drug therapy: Secondary | ICD-10-CM

## 2023-10-07 DIAGNOSIS — D0511 Intraductal carcinoma in situ of right breast: Secondary | ICD-10-CM | POA: Diagnosis not present

## 2023-10-07 DIAGNOSIS — I10 Essential (primary) hypertension: Secondary | ICD-10-CM

## 2023-10-07 DIAGNOSIS — E119 Type 2 diabetes mellitus without complications: Secondary | ICD-10-CM

## 2023-10-07 LAB — RAD ONC ARIA SESSION SUMMARY
Course Elapsed Days: 11
Plan Fractions Treated to Date: 10
Plan Prescribed Dose Per Fraction: 2.67 Gy
Plan Total Fractions Prescribed: 16
Plan Total Prescribed Dose: 42.72 Gy
Reference Point Dosage Given to Date: 26.7 Gy
Reference Point Session Dosage Given: 2.67 Gy
Session Number: 10

## 2023-10-07 MED ORDER — HYDROCHLOROTHIAZIDE 25 MG PO TABS: 25.0000 mg | ORAL_TABLET | Freq: Every day | ORAL | 3 refills | Status: AC

## 2023-10-10 ENCOUNTER — Ambulatory Visit: Payer: 59

## 2023-10-10 ENCOUNTER — Ambulatory Visit
Admission: RE | Admit: 2023-10-10 | Discharge: 2023-10-10 | Disposition: A | Discharge status: Home / Self Care | Payer: 59 | Source: Ambulatory Visit | Attending: Radiation Oncology | Admitting: Radiation Oncology

## 2023-10-10 ENCOUNTER — Other Ambulatory Visit: Payer: Self-pay

## 2023-10-10 DIAGNOSIS — D0511 Intraductal carcinoma in situ of right breast: Secondary | ICD-10-CM | POA: Diagnosis not present

## 2023-10-10 LAB — RAD ONC ARIA SESSION SUMMARY
Course Elapsed Days: 14
Plan Fractions Treated to Date: 11
Plan Prescribed Dose Per Fraction: 2.67 Gy
Plan Total Fractions Prescribed: 16
Plan Total Prescribed Dose: 42.72 Gy
Reference Point Dosage Given to Date: 29.37 Gy
Reference Point Session Dosage Given: 2.67 Gy
Session Number: 11

## 2023-10-11 ENCOUNTER — Ambulatory Visit
Admission: RE | Admit: 2023-10-11 | Discharge: 2023-10-11 | Disposition: A | Discharge status: Home / Self Care | Payer: 59 | Source: Ambulatory Visit | Attending: Radiation Oncology | Admitting: Radiation Oncology

## 2023-10-11 ENCOUNTER — Other Ambulatory Visit: Payer: Self-pay

## 2023-10-11 ENCOUNTER — Ambulatory Visit: Payer: 59

## 2023-10-11 DIAGNOSIS — D0511 Intraductal carcinoma in situ of right breast: Secondary | ICD-10-CM | POA: Diagnosis not present

## 2023-10-11 LAB — RAD ONC ARIA SESSION SUMMARY
Course Elapsed Days: 15
Plan Fractions Treated to Date: 12
Plan Prescribed Dose Per Fraction: 2.67 Gy
Plan Total Fractions Prescribed: 16
Plan Total Prescribed Dose: 42.72 Gy
Reference Point Dosage Given to Date: 32.04 Gy
Reference Point Session Dosage Given: 2.67 Gy
Session Number: 12

## 2023-10-12 ENCOUNTER — Ambulatory Visit: Payer: 59

## 2023-10-12 ENCOUNTER — Other Ambulatory Visit: Payer: Self-pay

## 2023-10-12 ENCOUNTER — Inpatient Hospital Stay: Payer: 59 | Admitting: Hematology and Oncology

## 2023-10-12 ENCOUNTER — Ambulatory Visit
Admission: RE | Admit: 2023-10-12 | Discharge: 2023-10-12 | Disposition: A | Discharge status: Home / Self Care | Payer: 59 | Source: Ambulatory Visit | Attending: Radiation Oncology | Admitting: Radiation Oncology

## 2023-10-12 DIAGNOSIS — D0511 Intraductal carcinoma in situ of right breast: Secondary | ICD-10-CM | POA: Diagnosis not present

## 2023-10-12 LAB — RAD ONC ARIA SESSION SUMMARY
Course Elapsed Days: 16
Plan Fractions Treated to Date: 13
Plan Prescribed Dose Per Fraction: 2.67 Gy
Plan Total Fractions Prescribed: 16
Plan Total Prescribed Dose: 42.72 Gy
Reference Point Dosage Given to Date: 34.71 Gy
Reference Point Session Dosage Given: 2.67 Gy
Session Number: 13

## 2023-10-13 ENCOUNTER — Ambulatory Visit
Admission: RE | Admit: 2023-10-13 | Discharge: 2023-10-13 | Disposition: A | Discharge status: Home / Self Care | Payer: 59 | Source: Ambulatory Visit | Attending: Radiation Oncology | Admitting: Radiation Oncology

## 2023-10-13 ENCOUNTER — Other Ambulatory Visit: Payer: Self-pay

## 2023-10-13 ENCOUNTER — Ambulatory Visit: Payer: 59

## 2023-10-13 DIAGNOSIS — D0511 Intraductal carcinoma in situ of right breast: Secondary | ICD-10-CM | POA: Diagnosis not present

## 2023-10-13 LAB — RAD ONC ARIA SESSION SUMMARY
Course Elapsed Days: 17
Plan Fractions Treated to Date: 14
Plan Prescribed Dose Per Fraction: 2.67 Gy
Plan Total Fractions Prescribed: 16
Plan Total Prescribed Dose: 42.72 Gy
Reference Point Dosage Given to Date: 37.38 Gy
Reference Point Session Dosage Given: 2.67 Gy
Session Number: 14

## 2023-10-14 ENCOUNTER — Ambulatory Visit
Admission: RE | Admit: 2023-10-14 | Discharge: 2023-10-14 | Disposition: A | Discharge status: Home / Self Care | Payer: 59 | Source: Ambulatory Visit | Attending: Radiation Oncology | Admitting: Radiation Oncology

## 2023-10-14 ENCOUNTER — Other Ambulatory Visit: Payer: Self-pay

## 2023-10-14 ENCOUNTER — Ambulatory Visit: Payer: 59

## 2023-10-14 DIAGNOSIS — D0511 Intraductal carcinoma in situ of right breast: Secondary | ICD-10-CM | POA: Diagnosis not present

## 2023-10-14 LAB — RAD ONC ARIA SESSION SUMMARY
Course Elapsed Days: 18
Plan Fractions Treated to Date: 15
Plan Prescribed Dose Per Fraction: 2.67 Gy
Plan Total Fractions Prescribed: 16
Plan Total Prescribed Dose: 42.72 Gy
Reference Point Dosage Given to Date: 40.05 Gy
Reference Point Session Dosage Given: 2.67 Gy
Session Number: 15

## 2023-10-17 ENCOUNTER — Other Ambulatory Visit: Payer: Self-pay

## 2023-10-17 ENCOUNTER — Ambulatory Visit: Payer: 59

## 2023-10-17 ENCOUNTER — Ambulatory Visit
Admission: RE | Admit: 2023-10-17 | Discharge: 2023-10-17 | Disposition: A | Discharge status: Home / Self Care | Payer: 59 | Source: Ambulatory Visit | Attending: Radiation Oncology | Admitting: Radiation Oncology

## 2023-10-17 DIAGNOSIS — D0511 Intraductal carcinoma in situ of right breast: Secondary | ICD-10-CM | POA: Diagnosis not present

## 2023-10-17 LAB — RAD ONC ARIA SESSION SUMMARY
Course Elapsed Days: 21
Plan Fractions Treated to Date: 16
Plan Prescribed Dose Per Fraction: 2.67 Gy
Plan Total Fractions Prescribed: 16
Plan Total Prescribed Dose: 42.72 Gy
Reference Point Dosage Given to Date: 42.72 Gy
Reference Point Session Dosage Given: 2.67 Gy
Session Number: 16

## 2023-10-18 ENCOUNTER — Other Ambulatory Visit: Payer: Self-pay

## 2023-10-18 ENCOUNTER — Ambulatory Visit
Admission: RE | Admit: 2023-10-18 | Discharge: 2023-10-18 | Disposition: A | Discharge status: Home / Self Care | Payer: 59 | Source: Ambulatory Visit | Attending: Radiation Oncology | Admitting: Radiation Oncology

## 2023-10-18 ENCOUNTER — Ambulatory Visit: Payer: 59

## 2023-10-18 DIAGNOSIS — D0511 Intraductal carcinoma in situ of right breast: Secondary | ICD-10-CM | POA: Diagnosis not present

## 2023-10-18 LAB — RAD ONC ARIA SESSION SUMMARY
Course Elapsed Days: 22
Plan Fractions Treated to Date: 1
Plan Prescribed Dose Per Fraction: 2.5 Gy
Plan Total Fractions Prescribed: 4
Plan Total Prescribed Dose: 10 Gy
Reference Point Dosage Given to Date: 2.5 Gy
Reference Point Session Dosage Given: 2.5 Gy
Session Number: 17

## 2023-10-19 ENCOUNTER — Ambulatory Visit
Admission: RE | Admit: 2023-10-19 | Discharge: 2023-10-19 | Disposition: A | Discharge status: Home / Self Care | Payer: 59 | Source: Ambulatory Visit | Attending: Radiation Oncology | Admitting: Radiation Oncology

## 2023-10-19 ENCOUNTER — Other Ambulatory Visit: Payer: Self-pay

## 2023-10-19 ENCOUNTER — Ambulatory Visit: Payer: 59

## 2023-10-19 ENCOUNTER — Telehealth: Payer: Self-pay | Admitting: Adult Health

## 2023-10-19 DIAGNOSIS — D0511 Intraductal carcinoma in situ of right breast: Secondary | ICD-10-CM | POA: Diagnosis not present

## 2023-10-19 LAB — RAD ONC ARIA SESSION SUMMARY
Course Elapsed Days: 23
Plan Fractions Treated to Date: 2
Plan Prescribed Dose Per Fraction: 2.5 Gy
Plan Total Fractions Prescribed: 4
Plan Total Prescribed Dose: 10 Gy
Reference Point Dosage Given to Date: 5 Gy
Reference Point Session Dosage Given: 2.5 Gy
Session Number: 18

## 2023-10-19 NOTE — Telephone Encounter (Signed)
 Marland Kitchen

## 2023-10-20 ENCOUNTER — Other Ambulatory Visit: Payer: Self-pay

## 2023-10-20 ENCOUNTER — Inpatient Hospital Stay: Payer: 59 | Attending: Hematology and Oncology | Admitting: Hematology and Oncology

## 2023-10-20 ENCOUNTER — Ambulatory Visit: Payer: 59

## 2023-10-20 ENCOUNTER — Ambulatory Visit
Admission: RE | Admit: 2023-10-20 | Discharge: 2023-10-20 | Disposition: A | Discharge status: Home / Self Care | Payer: 59 | Source: Ambulatory Visit | Attending: Radiation Oncology | Admitting: Radiation Oncology

## 2023-10-20 VITALS — BP 137/87 | HR 90 | Temp 98.6°F | Resp 18 | Ht 68.5 in | Wt 271.3 lb

## 2023-10-20 DIAGNOSIS — Z923 Personal history of irradiation: Secondary | ICD-10-CM | POA: Insufficient documentation

## 2023-10-20 DIAGNOSIS — Z79810 Long term (current) use of selective estrogen receptor modulators (SERMs): Secondary | ICD-10-CM | POA: Insufficient documentation

## 2023-10-20 DIAGNOSIS — D0511 Intraductal carcinoma in situ of right breast: Secondary | ICD-10-CM | POA: Diagnosis not present

## 2023-10-20 LAB — RAD ONC ARIA SESSION SUMMARY
Course Elapsed Days: 24
Plan Fractions Treated to Date: 3
Plan Prescribed Dose Per Fraction: 2.5 Gy
Plan Total Fractions Prescribed: 4
Plan Total Prescribed Dose: 10 Gy
Reference Point Dosage Given to Date: 7.5 Gy
Reference Point Session Dosage Given: 2.5 Gy
Session Number: 19

## 2023-10-20 MED ORDER — TAMOXIFEN CITRATE 10 MG PO TABS: 10.0000 mg | ORAL_TABLET | Freq: Every day | ORAL | 3 refills | Status: AC

## 2023-10-20 NOTE — Progress Notes (Signed)
 Patient Care Team: [name] as PCP - General (Family Medicine) Meriam Sprague, MD (Inactive) as PCP - Cardiology (Cardiology) Donnelly Angelica, RN as Oncology Nurse Navigator Pershing Proud, RN as Oncology Nurse Navigator Harriette Bouillon, MD as Consulting Physician (General Surgery) Serena Croissant, MD as Consulting Physician (Hematology and Oncology) Antony Blackbird, MD as Consulting Physician (Radiation Oncology)  DIAGNOSIS:  Encounter Diagnosis  Name Primary?   Ductal carcinoma in situ (DCIS) of right breast Yes    SUMMARY OF ONCOLOGIC HISTORY: Oncology History  Ductal carcinoma in situ (DCIS) of right breast  08/01/2023 Initial Diagnosis   Ductal carcinoma in situ (DCIS) of right breast   08/03/2023 Cancer Staging   Staging form: Breast, AJCC 8th Edition - Clinical stage from 08/03/2023: Stage 0 (cTis (DCIS), cN0, cM0, ER+, PR+) - Signed by Serena Croissant, MD on 08/03/2023 Stage prefix: Initial diagnosis Nuclear grade: G2   08/11/2023 Surgery   Right lumpectomy: Grade 2 DCIS, 4 mm, margins negative, ER 100%, PR 100%   08/13/2023 Genetic Testing   Negative [facility] CustomNext-Cancer +RNAinsight Panel.  Report date is 08/13/2023.   The CustomNext-Cancer+RNAinsight panel offered by [facility] includes sequencing, rearrangement, and RNA analysis for the following 56 genes: AIP, ALK, APC, ATM, BAP1, BARD1, BMPR1A, BRCA1, BRCA2, BRIP1, CDH1, CDK4, CDKN1B, CDKN2A, CHEK2, DICER1, FH, FLCN, MEN1, MET, MLH1, MSH2, MSH6, MUTYH, NF1, NF2, NTHL1, PALB2, PHOX2B, PMS2, POT1, PRKAR1A, PTCH1, PTEN, RAD51C, RAD51D, RB1, SMAD4, SMARCA4, SMARCB1, SMARCE1, STK11, SUFU, TP53, TSC1, TSC2 and VHL (sequencing and deletion/duplication); AXIN2, HOXB13, MBD4, MITF, MSH3, POLD1 and POLE (sequencing only); EPCAM and GREM1 (deletion/duplication only).     CHIEF COMPLIANT: Follow-up after radiation is complete  HISTORY OF PRESENT ILLNESS: Miranda Marquez is a 63 year old female with  breast cancer who presents for follow-up after completing radiation therapy.  She has experienced a mild suntan effect and some tenderness in her shoulder since Monday or Tuesday, but otherwise has tolerated the treatment well.  She no longer has her uterus, which is relevant to the risk profile of tamoxifen.  She has diabetes and was previously on [facility], which she found effective for weight management. She has gained about five pounds since stopping the medication.  Her past medical history includes double hip replacements and back surgery within the last three years. She is also on medication for cholesterol management.  She has a place at [facility], which she enjoys with her family, including four children and five grandchildren. Her husband recently retired, and she spends time together at the [place]. She is allergic to her son's cat.    ALLERGIES:  is allergic to nickel and other.  MEDICATIONS:  Current Outpatient Medications  Medication Sig Dispense Refill   BD PEN NEEDLE NANO 2ND GEN 32G X 4 MM MISC USE AS DIRECTED 100 each 6   diclofenac (VOLTAREN) 75 MG EC tablet Take 75 mg by mouth daily as needed for moderate pain (pain score 4-6).     FARXIGA 10 MG TABS tablet TAKE 1 TABLET BY MOUTH DAILY BEFORE BREAKFAST. 90 tablet 3   glucose blood (ONETOUCH VERIO) test strip Use as instructed 100 each 12   hydrochlorothiazide (HYDRODIURIL) 25 MG tablet Take 1 tablet (25 mg total) by mouth daily. 90 tablet 3   insulin glargine (LANTUS SOLOSTAR) 100 UNIT/ML Solostar Pen INJECT 10 UNITS INTO THE SKIN DAILY 45 mL 2   latanoprost (XALATAN) 0.005 % ophthalmic solution Place 1 drop into both eyes at  bedtime.     metFORMIN (GLUCOPHAGE-XR) 500 MG 24 hr tablet TAKE 2 TABLETS (1,000 MG TOTAL) BY MOUTH IN THE MORNING AND AT BEDTIME. 360 tablet 1   rosuvastatin (CRESTOR) 20 MG tablet Take 1 tablet (20 mg total) by mouth daily. 90 tablet 3   valsartan (DIOVAN) 320 MG tablet Take 1 tablet  (320 mg total) by mouth daily. 90 tablet 3   No current facility-administered medications for this visit.    PHYSICAL EXAMINATION: ECOG PERFORMANCE STATUS: 1 - Symptomatic but completely ambulatory  Vitals:   10/20/23 0945  BP: 137/87  Pulse: 90  Resp: 18  Temp: 98.6 F (37 C)  SpO2: 98%   Filed Weights   10/20/23 0945  Weight: 271 lb 4.8 oz (123.1 kg)     LABORATORY DATA:  I have reviewed the data as listed    Latest Ref Rng & Units 08/03/2023   12:26 PM 02/17/2023   12:40 PM 10/10/2022    2:25 PM  CMP  Glucose 70 - 99 mg/dL 161  096  045   BUN 8 - 23 mg/dL 31  16  16    Creatinine 0.44 - 1.00 mg/dL 4.09  8.11  9.14   Sodium 135 - 145 mmol/L 138  141  141   Potassium 3.5 - 5.1 mmol/L 3.6  4.6  4.0   Chloride 98 - 111 mmol/L 102  100  105   CO2 22 - 32 mmol/L 27  24  27    Calcium 8.9 - 10.3 mg/dL 9.7  9.7  9.3   Total Protein 6.5 - 8.1 g/dL 7.2     Total Bilirubin <1.2 mg/dL 0.7     Alkaline Phos 38 - 126 U/L 62     AST 15 - 41 U/L 16     ALT 0 - 44 U/L 21       Lab Results  Component Value Date   WBC 4.5 08/03/2023   HGB 13.9 08/03/2023   HCT 40.4 08/03/2023   MCV 87.1 08/03/2023   PLT 244 08/03/2023   NEUTROABS 2.5 08/03/2023    ASSESSMENT & PLAN:  Ductal carcinoma in situ (DCIS) of right breast 08/11/2023:Right lumpectomy: Grade 2 DCIS, 4 mm, margins negative, ER 100%, PR 100% Pathology counseling: I discussed the final pathology report of the patient provided  a copy of this report. I discussed the margins.  We also discussed the final staging along with previously performed ER/PR testing.  Adjuvant radiation therapy will be completed 10/21/2023. Treatment plan: Adjuvant antiestrogen therapy with tamoxifen 10 mg daily x 5 years  Tamoxifen counseling: We discussed the risks and benefits of tamoxifen. These include but not limited to insomnia, hot flashes, mood changes, vaginal dryness, and weight gain. Although rare, serious side effects including  endometrial cancer, risk of blood clots were also discussed. We strongly believe that the benefits far outweigh the risks. Patient understands these risks and consented to starting treatment. Planned treatment duration is 5 years.  Return to clinic in 3 months for survivorship care plan visit    No orders of the defined types were placed in this encounter.  The patient has a good understanding of the overall plan. she agrees with it. she will call with any problems that may develop before the next visit here. Total time spent: 30 mins including face to face time and time spent for planning, charting and co-ordination of care   Tamsen Meek, MD 10/20/23

## 2023-10-20 NOTE — Assessment & Plan Note (Signed)
 08/11/2023:Right lumpectomy: Grade 2 DCIS, 4 mm, margins negative, ER 100%, PR 100% Pathology counseling: I discussed the final pathology report of the patient provided  a copy of this report. I discussed the margins.  We also discussed the final staging along with previously performed ER/PR testing.  Adjuvant radiation therapy will be completed 10/21/2023. Treatment plan: Adjuvant antiestrogen therapy with tamoxifen 10 mg daily x 5 years  Tamoxifen counseling: We discussed the risks and benefits of tamoxifen. These include but not limited to insomnia, hot flashes, mood changes, vaginal dryness, and weight gain. Although rare, serious side effects including endometrial cancer, risk of blood clots were also discussed. We strongly believe that the benefits far outweigh the risks. Patient understands these risks and consented to starting treatment. Planned treatment duration is 5 years.  Return to clinic in 3 months for survivorship care plan visit

## 2023-10-21 ENCOUNTER — Other Ambulatory Visit: Payer: Self-pay

## 2023-10-21 ENCOUNTER — Ambulatory Visit
Admission: RE | Admit: 2023-10-21 | Discharge: 2023-10-21 | Disposition: A | Discharge status: Home / Self Care | Payer: 59 | Source: Ambulatory Visit | Attending: Radiation Oncology | Admitting: Radiation Oncology

## 2023-10-21 DIAGNOSIS — D0511 Intraductal carcinoma in situ of right breast: Secondary | ICD-10-CM | POA: Diagnosis not present

## 2023-10-21 LAB — RAD ONC ARIA SESSION SUMMARY
Course Elapsed Days: 25
Plan Fractions Treated to Date: 1
Plan Prescribed Dose Per Fraction: 2.5 Gy
Plan Total Fractions Prescribed: 1
Plan Total Prescribed Dose: 2.5 Gy
Reference Point Dosage Given to Date: 10 Gy
Reference Point Session Dosage Given: 2.5 Gy
Session Number: 20

## 2023-10-24 NOTE — Radiation Completion Notes (Signed)
  Radiation Oncology         (336) [phone] ________________________________  Name: Miranda Marquez MRN: [id number]  Date of Service: 10/21/2023  DOB: 08-Jan-1961  End of Treatment Note   Diagnosis: Stage 0 (cTis, cN0, cM0) DCIS of the right breast  Intent: Curative     ==========DELIVERED PLANS==========  First Treatment Date: 2023-09-26 Last Treatment Date: 2023-10-21   Plan Name: Breast_R Site: Breast, Right Technique: 3D Mode: Photon Dose Per Fraction: 2.67 Gy Prescribed Dose (Delivered / Prescribed): 42.72 Gy / 42.72 Gy Prescribed Fxs (Delivered / Prescribed): 16 / 16   Plan Name: Breast_R_Bst Site: Breast, Right Technique: Electron Mode: Electron Dose Per Fraction: 2.5 Gy Prescribed Dose (Delivered / Prescribed): 7.5 Gy / 7.5 Gy Prescribed Fxs (Delivered / Prescribed): 3 / 3   Plan Name: Breast_R_Bst:1 Site: Breast, Right Technique: Electron Mode: Electron Dose Per Fraction: 2.5 Gy Prescribed Dose (Delivered / Prescribed): 2.5 Gy / 2.5 Gy Prescribed Fxs (Delivered / Prescribed): 1 / 1     ====================================   The patient tolerated radiation well. She developed mild breast soreness and mild swelling to the nipple, and anticipated skin changes in the treatment field.   The patient will return in one month and will continue follow up with Dr. Pamelia Hoit as well.      Joyice Faster, PA-C

## 2023-11-15 ENCOUNTER — Encounter: Payer: Self-pay | Admitting: Radiation Oncology

## 2023-11-16 NOTE — Progress Notes (Signed)
 Radiation Oncology         (336) [phone] ________________________________  Name: Miranda Marquez MRN: [id number]  Date: 11/17/2023  DOB: [birth year]  Follow-Up Visit Note  CC: Miranda Marquez  [name], PA-C  No diagnosis found.  Diagnosis: Stage 0 (cTis, cN0, cM0) Right Breast DCIS Q, Intermediate DCIS ER+ / PR+ / Her2not assessed, Grade 2 : s/p right breast lumpectomy   Interval Since Last Radiation:  28 days  Intent: Curative  First Treatment Date: 2023-09-26 Last Treatment Date: 2023-10-21   Plan Name: Breast_R Site: Breast, Right Technique: 3D Mode: Photon Dose Per Fraction: 2.67 Gy Prescribed Dose (Delivered / Prescribed): 42.72 Gy / 42.72 Gy Prescribed Fxs (Delivered / Prescribed): 16 / 16   Plan Name: Breast_R_Bst Site: Breast, Right Technique: Electron Mode: Electron Dose Per Fraction: 2.5 Gy Prescribed Dose (Delivered / Prescribed): 7.5 Gy / 7.5 Gy Prescribed Fxs (Delivered / Prescribed): 3 / 3   Plan Name: Breast_R_Bst:1 Site: Breast, Right Technique: Electron Mode: Electron Dose Per Fraction: 2.5 Gy Prescribed Dose (Delivered / Prescribed): 2.5 Gy / 2.5 Gy Prescribed Fxs (Delivered / Prescribed): 1 / 1  Narrative:  The patient returns today for routine follow-up. Last seen in office on 09-08-23 for her initial consultation. Since then, patient completed her radiation therapy which she tolerated quite well but did endorse mild breast soreness and mild swelling to the nipple, along with anticipated skin changes in the treatment field.   Patient presented for a follow up with Dr. Pamelia Hoit on 10-20-23 during which she reported feeling well overall and was agreeable to initiating adjuvant antiestrogen therapy with tamoxifen 10 mg daily x 5 years.     No other significant oncologic interval history since the patient was last seen.                      Allergies:  is allergic to nickel and other.  Meds: Current Outpatient Medications  Medication Sig  Dispense Refill   BD PEN NEEDLE NANO 2ND GEN 32G X 4 MM MISC USE AS DIRECTED 100 each 6   diclofenac (VOLTAREN) 75 MG EC tablet Take 75 mg by mouth daily as needed for moderate pain (pain score 4-6).     FARXIGA 10 MG TABS tablet TAKE 1 TABLET BY MOUTH DAILY BEFORE BREAKFAST. 90 tablet 3   glucose blood (ONETOUCH VERIO) test strip Use as instructed 100 each 12   hydrochlorothiazide (HYDRODIURIL) 25 MG tablet Take 1 tablet (25 mg total) by mouth daily. 90 tablet 3   insulin glargine (LANTUS SOLOSTAR) 100 UNIT/ML Solostar Pen INJECT 10 UNITS INTO THE SKIN DAILY 45 mL 2   latanoprost (XALATAN) 0.005 % ophthalmic solution Place 1 drop into both eyes at bedtime.     metFORMIN (GLUCOPHAGE-XR) 500 MG 24 hr tablet TAKE 2 TABLETS (1,000 MG TOTAL) BY MOUTH IN THE MORNING AND AT BEDTIME. 360 tablet 1   rosuvastatin (CRESTOR) 20 MG tablet Take 1 tablet (20 mg total) by mouth daily. 90 tablet 3   tamoxifen (NOLVADEX) 10 MG tablet Take 1 tablet (10 mg total) by mouth daily. 90 tablet 3   valsartan (DIOVAN) 320 MG tablet Take 1 tablet (320 mg total) by mouth daily. 90 tablet 3   No current facility-administered medications for this encounter.    Physical Findings: The patient is in no acute distress. Patient is alert and oriented.  vitals were not taken for this visit. .  No significant changes. Lungs are clear to  auscultation bilaterally. Heart has regular rate and rhythm. No palpable cervical, supraclavicular, or axillary adenopathy. Abdomen soft, non-tender, normal bowel sounds.   Lab Findings: Lab Results  Component Value Date   WBC 4.5 08/03/2023   HGB 13.9 08/03/2023   HCT 40.4 08/03/2023   MCV 87.1 08/03/2023   PLT 244 08/03/2023    Radiographic Findings: No results found.  Impression:  Stage 0 (cTis, cN0, cM0) Right Breast DCIS Q, Intermediate DCIS ER+ / PR+ / Her2not assessed, Grade 2 : s/p right breast lumpectomy   The patient is recovering from the effects of radiation.  ***  Plan:   ***   *** minutes of total time was spent for this patient encounter, including preparation, face-to-face counseling with the patient and coordination of care, physical exam, and documentation of the encounter. ____________________________________  Billie Lade, PhD, MD  This document serves as a record of services personally performed by Antony Blackbird, MD. It was created on his behalf by Herbie Saxon, a trained medical scribe. The creation of this record is based on the scribe's personal observations and the provider's statements to them. This document has been checked and approved by the attending provider.

## 2023-11-17 ENCOUNTER — Encounter: Payer: Self-pay | Admitting: Radiation Oncology

## 2023-11-17 ENCOUNTER — Ambulatory Visit
Admission: RE | Admit: 2023-11-17 | Discharge: 2023-11-17 | Disposition: A | Discharge status: Home / Self Care | Payer: 59 | Source: Ambulatory Visit | Attending: Radiation Oncology | Admitting: Radiation Oncology

## 2023-11-17 VITALS — BP 148/99 | HR 82 | Temp 96.8°F | Resp 18 | Ht 68.5 in | Wt 275.0 lb

## 2023-11-17 DIAGNOSIS — D0511 Intraductal carcinoma in situ of right breast: Secondary | ICD-10-CM

## 2023-11-17 HISTORY — DX: Personal history of irradiation: Z92.3

## 2023-11-17 NOTE — Progress Notes (Addendum)
 Miranda Marquez is here today for follow up post radiation to the breast.   Breast Side: Right   They completed their radiation on: 2023-10-21   Does the patient complain of any of the following: Post radiation skin issues: Yes, she reports having a really bad sunburn. Breast Tenderness: Yes Breast Swelling: Denies Lymphadema: Denies Range of Motion limitations: Denies Fatigue post radiation: Denies Appetite good/fair/poor: Denies  Additional comments if applicable: Patient reports to be doing well overall today.    BP (!) 148/99 (BP Location: Left Arm, Patient Position: Sitting)   Pulse 82   Temp (!) 96.8 F (36 C) (Temporal)   Resp 18   Ht 5' 8.5" (1.74 m)   Wt 275 lb (124.7 kg)   SpO2 99%   BMI 41.21 kg/m

## 2023-11-21 ENCOUNTER — Ambulatory Visit: Payer: 59 | Admitting: Radiation Oncology

## 2023-12-29 ENCOUNTER — Encounter: Payer: Self-pay | Admitting: Internal Medicine

## 2023-12-29 ENCOUNTER — Telehealth: Payer: Self-pay | Admitting: Internal Medicine

## 2023-12-29 ENCOUNTER — Ambulatory Visit: Payer: 59 | Admitting: Internal Medicine

## 2023-12-29 ENCOUNTER — Telehealth: Admitting: Internal Medicine

## 2023-12-29 VITALS — Ht 68.5 in

## 2023-12-29 DIAGNOSIS — Z7985 Long-term (current) use of injectable non-insulin antidiabetic drugs: Secondary | ICD-10-CM

## 2023-12-29 DIAGNOSIS — E1165 Type 2 diabetes mellitus with hyperglycemia: Secondary | ICD-10-CM | POA: Diagnosis not present

## 2023-12-29 DIAGNOSIS — E785 Hyperlipidemia, unspecified: Secondary | ICD-10-CM

## 2023-12-29 DIAGNOSIS — Z8639 Personal history of other endocrine, nutritional and metabolic disease: Secondary | ICD-10-CM

## 2023-12-29 DIAGNOSIS — E119 Type 2 diabetes mellitus without complications: Secondary | ICD-10-CM

## 2023-12-29 MED ORDER — TIRZEPATIDE 2.5 MG/0.5ML ~~LOC~~ SOAJ: 2.5000 mg | SUBCUTANEOUS | 3 refills | Status: DC

## 2023-12-29 MED ORDER — METFORMIN HCL ER 500 MG PO TB24: 1000.0000 mg | ORAL_TABLET | Freq: Two times a day (BID) | ORAL | 3 refills | Status: DC

## 2023-12-29 MED ORDER — LANTUS SOLOSTAR 100 UNIT/ML ~~LOC~~ SOPN: 10.0000 [IU] | PEN_INJECTOR | Freq: Every day | SUBCUTANEOUS | 3 refills | Status: AC

## 2023-12-29 MED ORDER — DAPAGLIFLOZIN PROPANEDIOL 10 MG PO TABS: 10.0000 mg | ORAL_TABLET | Freq: Every day | ORAL | 3 refills | Status: AC

## 2023-12-29 NOTE — Progress Notes (Signed)
 Virtual Visit via Video Note  I connected with Miranda Marquez at 10:30 AM EDT by a video enabled telemedicine application and verified that I am speaking with the correct person using two identifiers.   I discussed the limitations of evaluation and management by telemedicine and the availability of in person appointments. The patient expressed understanding and agreed to proceed.   -Location of the patient : home -Location of the provider : Office -The names of all persons participating in the telemedicine service : Pt and myself         Name: Miranda Marquez  Age/ Sex: 63 y.o., female   MRN/ DOB: [id number], 11-15-1960     PCP: Miranda Marquez   Reason for Endocrinology Evaluation: Type 2 Diabetes Mellitus  Initial Endocrine Consultative Visit: 01/16/2021    PATIENT IDENTIFIER: Miranda Marquez is a 63 y.o. female with a past medical history of HTN, T2DM, HTN and dyslipidemia. The patient has followed with Endocrinology clinic since 01/16/2021 for consultative assistance with management of her diabetes.  DIABETIC HISTORY:  Miranda Marquez was diagnosed with DM 07/2020.  She had gestations diabetes > 25 yrs ago. Her hemoglobin A1c has ranged from 10.2% in 2022, peaking at 12.4% in 2021  On her initial visit to our clinic she had an A1c of 10.7% she was not on any medications, we started metformin  and insulin    By 04/2021 Farxiga  started    Intolerant to Ozempic  due to GI issues 2023, she was started by cardiology     PITUITARY HISTORY: She has a diagnosis of pituitary microadenoma in 2006. With an MRI showing a 5 mm adenoma . She has a Hx of hyperprolactinemia . She was on Bromocriptine  until 2021 . Repeat Prolactin was normal x 2 in 2022 and we opted to hold off on Bromocriptine .      SUBJECTIVE:   During the last visit (08/30/2023): A1c 6.5%     Today (12/29/2023): Miranda Marquez  is here for a follow up on diabetes management. She checks her blood sugars 2 times daily.  The patient has not had hypoglycemic episodes since the last clinic visit.  She is S/P right breast sx 07/2023 for Breast ca  complicated by an allergic reaction , she has completed radiation treatment.  She is on tamoxifen  She is concerned about tamoxifen  and her history of pituitary microadenoma  She was unable to come to the office today because she woke up around 3 AM with vomiting, grandchildren and daughter have had gastritis, no fever or diarrhea at this time but she does feel warm in general, symptoms have improved since 2 AM  She did discontinue Mounjaro  while undergoing radiation treatment, she also endorsed side pain while on Mounjaro    HOME DIABETES REGIMEN:  Metformin  500 mg XR 2 tabs BID  Farxiga  10 mg daily  Lantus  10 units daily  Mounjaro  2.5 mg weekly-not taking Rosuvastatin  20 mg daily   Statin: yes ACE-I/ARB: no Prior Diabetic Education: no   GLUCOSE LOG:   Fasting  142-149 PM 148-151    DIABETIC COMPLICATIONS: Microvascular complications:   Denies: CKD, neuropathy, retinopathy Last Eye Exam: scheduled in 03/2023   Macrovascular complications:   Denies: CAD, CVA, PVD   HISTORY:  Past Medical History:  Past Medical History:  Diagnosis Date   Arthritis    Breast cancer (HCC)    Cervical herniated disc    Complication of anesthesia    hypotension  in april after hysterectomy  History of radiation therapy    Right breast- 09/26/23-10/21/23- Dr. Retta Caster   Hypertension    not taking meds    Pituitary microadenoma with hyperprolactinemia (HCC)    Psoriasis both elbows, scalp, left thigh, saw dr Fleurette Humbles, last week given cream for areas healing   Sleep apnea    never had study done, but things she does have it   Past Surgical History:  Past Surgical History:  Procedure Laterality Date   ABDOMINAL HYSTERECTOMY     APPENDECTOMY     BREAST BIOPSY Right 10/11/2014   BREAST BIOPSY Right 07/26/2023   MM RT BREAST BX W LOC DEV 1ST LESION IMAGE BX  SPEC STEREO GUIDE 07/26/2023 [facility]   BREAST BIOPSY  08/10/2023   MM RT RADIOACTIVE SEED LOC MAMMO GUIDE 08/10/2023 [facility]   BREAST EXCISIONAL BIOPSY Right 07/2015   BREAST EXCISIONAL BIOPSY Right    BREAST LUMPECTOMY WITH RADIOACTIVE SEED LOCALIZATION Right 08/07/2015   Procedure: RIGHT BREAST LUMPECTOMY WITH RADIOACTIVE SEED LOCALIZATION AND EXCISION OF BREAST MASS X2;  Surgeon: Oza Blumenthal, MD;  Location[facility];  Service: General;  Laterality: Right;   BREAST LUMPECTOMY WITH RADIOACTIVE SEED LOCALIZATION Right 08/11/2023   Procedure: RIGHT BREAST SEED LUMPECTOMY;  Surgeon: Sim Dryer, MD;  Location: [facility] OR;  Service: General;  Laterality: Right;   BREAST SURGERY     DILATION AND CURETTAGE OF UTERUS     FOOT SURGERY     left heel     HERNIA REPAIR     umbilical   MAXIMUM ACCESS (MAS)POSTERIOR LUMBAR INTERBODY FUSION (PLIF) 1 LEVEL N/A 09/09/2016   Procedure: MAXIMUM ACCESS SURGERY POSTERIOR LUMBAR INTERBODY FUSION LUMBAR THREE-FOUR;  Surgeon: Isadora Mar, MD;  Location: [facility] OR;  Service: Neurosurgery;  Laterality: N/A;   MINI-LAPAROTOMY W/ TUBAL LIGATION     ROBOTIC ASSISTED TOTAL HYSTERECTOMY WITH BILATERAL SALPINGO OOPHERECTOMY Bilateral 12/03/2014   Procedure: ROBOTIC ASSISTED TOTAL HYSTERECTOMY WITH BILATERAL SALPINGO OOPHORECTOMY;  Surgeon: Alphonso Aschoff, MD;  Location: [facility];  Service: Gynecology;  Laterality: Bilateral;   TOTAL HIP ARTHROPLASTY Right 10/18/2017   Procedure: RIGHT TOTAL HIP ARTHROPLASTY ANTERIOR APPROACH;  Surgeon: Claiborne Crew, MD;  Location: [facility];  Service: Orthopedics;  Laterality: Right;  70 mins   TUBAL LIGATION     Social History:  reports that she quit smoking about 45 years ago. Her smoking use included cigarettes. She has never used smokeless tobacco. She reports current alcohol use of about 2.0 standard drinks of alcohol per week. She reports that she does not use drugs. Family History:  Family History  Problem Relation Age  of Onset   Cancer Father        skin   Melanoma Father 50       arm   Hypertension Father    Prostate cancer Father 81       mets in 10s   Cancer Paternal Aunt        breast   Breast cancer Paternal Aunt 80   Lung cancer Maternal Grandfather 42   Cancer Paternal Grandfather        skin   Melanoma Paternal Grandfather 4       mets   Other Daughter 2       optic glioma   Cancer Cousin        prostate   Prostate cancer Cousin 12     HOME MEDICATIONS: Allergies as of 12/29/2023       Reactions   Nickel Dermatitis  Causes irritation    Other Dermatitis   Steri Strips         Medication List        Accurate as of Dec 29, 2023 10:04 AM. If you have any questions, ask your nurse or doctor.          albuterol 108 (90 Base) MCG/ACT inhaler Commonly known as: VENTOLIN HFA SMARTSIG:2 Puff(s) By Mouth Every 4 Hours PRN   BD Pen Needle Nano 2nd Gen 32G X 4 MM Misc Generic drug: Insulin  Pen Needle USE AS DIRECTED   Clenpiq 10-3.5-12 MG-GM -GM/175ML Soln Generic drug: Sod Picosulfate-Mag Ox-Cit Acd 175 ML ORALLY TWICE for 2   clobetasol 0.05 % external solution Commonly known as: TEMOVATE SMARTSIG:sparingly Twice Daily PRN   diclofenac 75 MG EC tablet Commonly known as: VOLTAREN Take 75 mg by mouth daily as needed for moderate pain (pain score 4-6).   Farxiga  10 MG Tabs tablet Generic drug: dapagliflozin  propanediol TAKE 1 TABLET BY MOUTH DAILY BEFORE BREAKFAST.   hydrochlorothiazide  25 MG tablet Commonly known as: HYDRODIURIL  Take 1 tablet (25 mg total) by mouth daily.   Lantus  SoloStar 100 UNIT/ML Solostar Pen Generic drug: insulin  glargine INJECT 10 UNITS INTO THE SKIN DAILY   latanoprost 0.005 % ophthalmic solution Commonly known as: XALATAN Place 1 drop into both eyes at bedtime.   metFORMIN  500 MG 24 hr tablet Commonly known as: GLUCOPHAGE -XR TAKE 2 TABLETS (1,000 MG TOTAL) BY MOUTH IN THE MORNING AND AT BEDTIME.   OneTouch Verio test  strip Generic drug: glucose blood Use as instructed   rosuvastatin  20 MG tablet Commonly known as: CRESTOR  Take 1 tablet (20 mg total) by mouth daily.   tamoxifen  10 MG tablet Commonly known as: NOLVADEX  Take 1 tablet (10 mg total) by mouth daily.   valsartan  320 MG tablet Commonly known as: DIOVAN  Take 1 tablet (320 mg total) by mouth daily.         OBJECTIVE:   Vital Signs: There were no vitals taken for this visit.  Wt Readings from Last 3 Encounters:  11/17/23 275 lb (124.7 kg)  10/20/23 271 lb 4.8 oz (123.1 kg)  09/08/23 273 lb 3.2 oz (123.9 kg)     Exam: General: Pt appears well and is in NAD  Neuro: MS is good with appropriate affect, pt is alert and Ox3    DM foot exam: 02/28/2023     The skin of the feet is intact without sores or ulcerations. The pedal pulses are 2+ on right and 2+ on left. The sensation is intact to a screening 5.07, 10 gram monofilament bilaterally      DATA REVIEWED:  Lab Results  Component Value Date   HGBA1C 6.5 (A) 08/30/2023   HGBA1C 7.0 (A) 02/28/2023   HGBA1C 6.6 (A) 03/09/2022       Latest Reference Range & Units 08/03/23 12:26  Sodium 135 - 145 mmol/L 138  Potassium 3.5 - 5.1 mmol/L 3.6  Chloride 98 - 111 mmol/L 102  CO2 22 - 32 mmol/L 27  Glucose 70 - 99 mg/dL 161 (H)  BUN 8 - 23 mg/dL 31 (H)  Creatinine 0.96 - 1.00 mg/dL 0.45  Calcium  8.9 - 10.3 mg/dL 9.7  Anion gap 5 - 15  9  Alkaline Phosphatase 38 - 126 U/L 62  Albumin 3.5 - 5.0 g/dL 4.5  AST 15 - 41 U/L 16  ALT 0 - 44 U/L 21  Total Protein 6.5 - 8.1 g/dL 7.2  Total Bilirubin <4.0  mg/dL 0.7  GFR, Est Non African American >60 mL/min >60      BRAIN MRI 03/23/2005   1. Suspect 5 mm prolactinoma in the right inferior aspect of the gland with mild remodeling of the sellar floor as well as mild deviation of the pituitary stalk from right to left.  2. Otherwise negative cranial MRI  ASSESSMENT / PLAN / RECOMMENDATIONS:   1) Type 2 Diabetes Mellitus,  optimally controlled, With out complications - Most recent A1c of 6.5 %. Goal A1c < 7.0 %.    -Unable to proceed with an A1c as this was a virtual visit -Her fasting BG's are above goal -She has not been on Mounjaro  since undergoing radiation, she would like to retry again, I did advise the patient to increase Lantus  to 12 units if she is not going to restart Mounjaro  -She could not tolerate Ozempic  due to bloating and severe constipation   MEDICATIONS: Continue metformin  500 mg 2 tablets BID Continue  Farxiga  10 mg, 1 tablet daily in the morning Continue Lantus  10 units daily Restart Mounjaro  2.5 mg weekly  EDUCATION / INSTRUCTIONS: BG monitoring instructions: Patient is instructed to check her blood sugars 1 times a day, Fasting Call [facility] if: BG persistently < 70  I reviewed the Rule of 15 for the treatment of hypoglycemia in detail with the patient. Literature supplied.   2) Diabetic complications:  Eye: Does not have known diabetic retinopathy.  Neuro/ Feet: Does not have known diabetic peripheral neuropathy .  Renal: Patient does not have known baseline CKD. She   is not on an ACEI/ARB at present.    3) Dyslipidemia:    -She was started on Statin therapy in 12/2020 -She is tolerating this without side effects   Continue Rosuvastatin  20 mg daily    4) Hx Pituitary Microadenoma:    - She was on Bromocriptine  for 3 decades, Has been off since 2021 - MRI from 2006 showed a 6 mm adenoma  -All pituitary hormones have been normal in May 2022 - She continues to be concerned about her pituitary gland, she is also concerned that the tamoxifen  may have any effect on her pituitary gland which I did explain to the patient that it does not. - She has no galactorrhea - She would like to have labs on next visit  F/U in 4 months   Signed electronically by: Natale Bail, MD  [facility] Endocrinology  [facility] [address] [place]  [address] [place], [place] [address] Phone: [phone] FAX: [phone]   CC: Belenda Bowie [address] [place] [address] Phone: [phone]  Fax: [phone]  Return to Endocrinology clinic as below: Future Appointments  Date Time Provider Department Center  12/29/2023 10:30 AM Dempsy Damiano, Julian Obey, MD [facility] None  01/17/2024 10:30 AM [facility] None  01/17/2024 11:00 AM Causey, Laura Polio, NP [facility] None

## 2023-12-29 NOTE — Progress Notes (Deleted)
 Name: Miranda Marquez  Age/ Sex: 63 y.o., female   MRN/ DOB: [id number], [birth year]     PCP: Belenda Bowie   Reason for Endocrinology Evaluation: Type 2 Diabetes Mellitus  Initial Endocrine Consultative Visit: 01/16/2021    PATIENT IDENTIFIER: [name] is a 63 y.o. female with a past medical history of HTN, T2DM, HTN and dyslipidemia. The patient has followed with Endocrinology clinic since 01/16/2021 for consultative assistance with management of her diabetes.  DIABETIC HISTORY:  [name] was diagnosed with DM 07/2020.  She had gestations diabetes > 25 yrs ago. Her hemoglobin A1c has ranged from 10.2% in 2022, peaking at 12.4% in 2021  On her initial visit to our clinic she had an A1c of 10.7% she was not on any medications, we started metformin  and insulin    By 04/2021 Farxiga  started    Intolerant to Ozempic  due to GI issues 2023, she was started by cardiology     PITUITARY HISTORY: She has a diagnosis of pituitary microadenoma in 2006. With an MRI showing a 5 mm adenoma . She has a Hx of hyperprolactinemia . She was on Bromocriptine  until 2021 . Repeat Prolactin was normal x 2 in 2022 and we opted to hold off on Bromocriptine .      SUBJECTIVE:   During the last visit (08/30/2023): A1c 6.5%     Today (12/29/2023): [name]  is here for a follow up on diabetes management. She checks her blood sugars 2 times daily. The patient has not had hypoglycemic episodes since the last clinic visit.  She follows  with cardiology for coronary artery calcifications and HTN, on Crestor  She is S/P right breast sx 07/2023 for Breast ca  complicated by an allergic reaction , she has been undergoing radiation treatment  Denies nausea or vomiting  She did have transient right abdominal cramps with mounjaro   Denies constipation      HOME DIABETES REGIMEN:  Metformin  500 mg XR 2 tabs BID  Farxiga  10 mg daily  Lantus  10 units daily  Mounjaro  2.5 mg weekly Rosuvastatin  20 mg  daily   Statin: yes ACE-I/ARB: no Prior Diabetic Education: no   GLUCOSE LOG:  n/a Memory recall 130's   DIABETIC COMPLICATIONS: Microvascular complications:   Denies: CKD, neuropathy, retinopathy Last Eye Exam: scheduled in 03/2023   Macrovascular complications:   Denies: CAD, CVA, PVD   HISTORY:  Past Medical History:  Past Medical History:  Diagnosis Date   Arthritis    Breast cancer (HCC)    Cervical herniated disc    Complication of anesthesia    hypotension  in april after hysterectomy   History of radiation therapy    Right breast- 09/26/23-10/21/23- Dr. Retta Caster   Hypertension    not taking meds    Pituitary microadenoma with hyperprolactinemia (HCC)    Psoriasis both elbows, scalp, left thigh, saw dr Miranda Marquez, last week given cream for areas healing   Sleep apnea    never had study done, but things she does have it   Past Surgical History:  Past Surgical History:  Procedure Laterality Date   ABDOMINAL HYSTERECTOMY     APPENDECTOMY     BREAST BIOPSY Right 10/11/2014   BREAST BIOPSY Right 07/26/2023   MM RT BREAST BX W LOC DEV 1ST LESION IMAGE BX SPEC STEREO GUIDE 07/26/2023 [facility]   BREAST BIOPSY  08/10/2023   MM RT RADIOACTIVE SEED LOC MAMMO GUIDE 08/10/2023 [facility]   BREAST EXCISIONAL BIOPSY Right  07/2015   BREAST EXCISIONAL BIOPSY Right    BREAST LUMPECTOMY WITH RADIOACTIVE SEED LOCALIZATION Right 08/07/2015   Procedure: RIGHT BREAST LUMPECTOMY WITH RADIOACTIVE SEED LOCALIZATION AND EXCISION OF BREAST MASS X2;  Surgeon: Miranda Blumenthal, MD;  Location: [facility];  Service: General;  Laterality: Right;   BREAST LUMPECTOMY WITH RADIOACTIVE SEED LOCALIZATION Right 08/11/2023   Procedure: RIGHT BREAST SEED LUMPECTOMY;  Surgeon: Miranda Dryer, MD;  Location: [facility] OR;  Service: General;  Laterality: Right;   BREAST SURGERY     DILATION AND CURETTAGE OF UTERUS     FOOT SURGERY     left heel     HERNIA REPAIR     umbilical   MAXIMUM  ACCESS (MAS)POSTERIOR LUMBAR INTERBODY FUSION (PLIF) 1 LEVEL N/A 09/09/2016   Procedure: MAXIMUM ACCESS SURGERY POSTERIOR LUMBAR INTERBODY FUSION LUMBAR THREE-FOUR;  Surgeon: Miranda Mar, MD;  Location: [facility] OR;  Service: Neurosurgery;  Laterality: N/A;   MINI-LAPAROTOMY W/ TUBAL LIGATION     ROBOTIC ASSISTED TOTAL HYSTERECTOMY WITH BILATERAL SALPINGO OOPHERECTOMY Bilateral 12/03/2014   Procedure: ROBOTIC ASSISTED TOTAL HYSTERECTOMY WITH BILATERAL SALPINGO OOPHORECTOMY;  Surgeon: Miranda Aschoff, MD;  Location: [facility];  Service: Gynecology;  Laterality: Bilateral;   TOTAL HIP ARTHROPLASTY Right 10/18/2017   Procedure: RIGHT TOTAL HIP ARTHROPLASTY ANTERIOR APPROACH;  Surgeon: Miranda Crew, MD;  Location: [facility];  Service: Orthopedics;  Laterality: Right;  70 mins   TUBAL LIGATION     Social History:  reports that she quit smoking about 45 years ago. Her smoking use included cigarettes. She has never used smokeless tobacco. She reports current alcohol use of about 2.0 standard drinks of alcohol per week. She reports that she does not use drugs. Family History:  Family History  Problem Relation Age of Onset   Cancer Father        skin   Melanoma Father 50       arm   Hypertension Father    Prostate cancer Father 82       mets in 50s   Cancer Paternal Aunt        breast   Breast cancer Paternal Aunt 72   Lung cancer Maternal Grandfather 75   Cancer Paternal Grandfather        skin   Melanoma Paternal Grandfather 69       mets   Other Daughter 2       optic glioma   Cancer Cousin        prostate   Prostate cancer Cousin 25     HOME MEDICATIONS: Allergies as of 12/29/2023       Reactions   Nickel Dermatitis   Causes irritation    Other Dermatitis   Steri Strips         Medication List        Accurate as of Dec 29, 2023  7:14 AM. If you have any questions, ask your nurse or doctor.          albuterol 108 (90 Base) MCG/ACT inhaler Commonly known as: VENTOLIN HFA SMARTSIG:2  Puff(s) By Mouth Every 4 Hours PRN   BD Pen Needle Nano 2nd Gen 32G X 4 MM Misc Generic drug: Insulin  Pen Needle USE AS DIRECTED   Clenpiq 10-3.5-12 MG-GM -GM/175ML Soln Generic drug: Sod Picosulfate-Mag Ox-Cit Acd 175 ML ORALLY TWICE for 2   clobetasol 0.05 % external solution Commonly known as: TEMOVATE SMARTSIG:sparingly Twice Daily PRN   diclofenac 75 MG EC tablet Commonly known as: VOLTAREN Take 75 mg by  mouth daily as needed for moderate pain (pain score 4-6).   Farxiga  10 MG Tabs tablet Generic drug: dapagliflozin  propanediol TAKE 1 TABLET BY MOUTH DAILY BEFORE BREAKFAST.   hydrochlorothiazide  25 MG tablet Commonly known as: HYDRODIURIL  Take 1 tablet (25 mg total) by mouth daily.   Lantus  SoloStar 100 UNIT/ML Solostar Pen Generic drug: insulin  glargine INJECT 10 UNITS INTO THE SKIN DAILY   latanoprost 0.005 % ophthalmic solution Commonly known as: XALATAN Place 1 drop into both eyes at bedtime.   metFORMIN  500 MG 24 hr tablet Commonly known as: GLUCOPHAGE -XR TAKE 2 TABLETS (1,000 MG TOTAL) BY MOUTH IN THE MORNING AND AT BEDTIME.   OneTouch Verio test strip Generic drug: glucose blood Use as instructed   rosuvastatin  20 MG tablet Commonly known as: CRESTOR  Take 1 tablet (20 mg total) by mouth daily.   tamoxifen  10 MG tablet Commonly known as: NOLVADEX  Take 1 tablet (10 mg total) by mouth daily.   valsartan  320 MG tablet Commonly known as: DIOVAN  Take 1 tablet (320 mg total) by mouth daily.         OBJECTIVE:   Vital Signs: There were no vitals taken for this visit.  Wt Readings from Last 3 Encounters:  11/17/23 275 lb (124.7 kg)  10/20/23 271 lb 4.8 oz (123.1 kg)  09/08/23 273 lb 3.2 oz (123.9 kg)     Exam: General: Pt appears well and is in NAD  Lungs: Clear with good BS bilat   Heart: RRR   Extremities: No pretibial edema.   Neuro: MS is good with appropriate affect, pt is alert and Ox3    DM foot exam: 02/28/2023     The skin of  the feet is intact without sores or ulcerations. The pedal pulses are 2+ on right and 2+ on left. The sensation is intact to a screening 5.07, 10 gram monofilament bilaterally      DATA REVIEWED:  Lab Results  Component Value Date   HGBA1C 6.5 (A) 08/30/2023   HGBA1C 7.0 (A) 02/28/2023   HGBA1C 6.6 (A) 03/09/2022       Latest Reference Range & Units 08/03/23 12:26  Sodium 135 - 145 mmol/L 138  Potassium 3.5 - 5.1 mmol/L 3.6  Chloride 98 - 111 mmol/L 102  CO2 22 - 32 mmol/L 27  Glucose 70 - 99 mg/dL 161 (H)  BUN 8 - 23 mg/dL 31 (H)  Creatinine 0.96 - 1.00 mg/dL 0.45  Calcium  8.9 - 10.3 mg/dL 9.7  Anion gap 5 - 15  9  Alkaline Phosphatase 38 - 126 U/L 62  Albumin 3.5 - 5.0 g/dL 4.5  AST 15 - 41 U/L 16  ALT 0 - 44 U/L 21  Total Protein 6.5 - 8.1 g/dL 7.2  Total Bilirubin <4.0 mg/dL 0.7  GFR, Est Non African American >60 mL/min >60      BRAIN MRI 03/23/2005   1. Suspect 5 mm prolactinoma in the right inferior aspect of the gland with mild remodeling of the sellar floor as well as mild deviation of the pituitary stalk from right to left.  2. Otherwise negative cranial MRI  ASSESSMENT / PLAN / RECOMMENDATIONS:   1) Type 2 Diabetes Mellitus, optimally controlled, With out complications - Most recent A1c of 6.5 %. Goal A1c < 7.0 %.    -A1c at goal -She could not tolerate Ozempic  due to bloating and severe constipation -She did well on Mounjaro  2.5 mg weekly, but had to discontinue prior to breast cancer surgery and has  not restarted in 3 weeks -No changes at this time, will restart Mounjaro    MEDICATIONS: Continue metformin  500 mg 2 tablets BID Continue  Farxiga  10 mg, 1 tablet daily in the morning Continue Lantus  10 units daily Continue Mounjaro  2.5 mg weekly  EDUCATION / INSTRUCTIONS: BG monitoring instructions: Patient is instructed to check her blood sugars 1 times a day, Fasting Call [place] [facility] if: BG persistently < 70  I reviewed the Rule of 15  for the treatment of hypoglycemia in detail with the patient. Literature supplied.   2) Diabetic complications:  Eye: Does not have known diabetic retinopathy.  Neuro/ Feet: Does not have known diabetic peripheral neuropathy .  Renal: Patient does not have known baseline CKD. She   is not on an ACEI/ARB at present.    3) Dyslipidemia:    -She was started on Statin therapy in 12/2020 -She is tolerating this without side effects   Continue Rosuvastatin  20 mg daily       F/U in 4 months   Signed electronically by: Natale Bail, MD  [facility] Endocrinology  [facility] [address] [place] [place], [place] [address] Phone: [phone] FAX: [phone]   CC: Belenda Bowie [address] [place] [address] Phone: [phone]  Fax: [phone]  Return to Endocrinology clinic as below: Future Appointments  Date Time Provider Department Center  12/29/2023 10:30 AM Arlie Riker, Julian Obey, MD [facility] None  01/17/2024 10:30 AM [facility] None  01/17/2024 11:00 AM Causey, Laura Polio, NP [facility] None

## 2023-12-29 NOTE — Telephone Encounter (Signed)
 Please contact the pain and schedule her for follow-up with me in 4 months   Thanks

## 2023-12-29 NOTE — Telephone Encounter (Signed)
 LMx1 to schedule 4 month follow up with Dr. Lonzo Cloud.

## 2023-12-30 NOTE — Telephone Encounter (Signed)
 LMx1 to change 6 month appointment that is already scheduled to 4 month appointment.

## 2024-01-09 ENCOUNTER — Other Ambulatory Visit: Payer: Self-pay | Admitting: Internal Medicine

## 2024-01-09 DIAGNOSIS — E1165 Type 2 diabetes mellitus with hyperglycemia: Secondary | ICD-10-CM

## 2024-01-13 ENCOUNTER — Other Ambulatory Visit: Payer: Self-pay | Admitting: *Deleted

## 2024-01-13 DIAGNOSIS — D0511 Intraductal carcinoma in situ of right breast: Secondary | ICD-10-CM

## 2024-01-17 ENCOUNTER — Inpatient Hospital Stay: Payer: 59 | Attending: Adult Health | Admitting: Adult Health

## 2024-01-17 ENCOUNTER — Inpatient Hospital Stay: Payer: 59

## 2024-01-17 ENCOUNTER — Encounter: Payer: Self-pay | Admitting: Adult Health

## 2024-01-17 VITALS — BP 118/65 | HR 72 | Temp 98.3°F | Resp 17 | Wt 267.6 lb

## 2024-01-17 DIAGNOSIS — D0511 Intraductal carcinoma in situ of right breast: Secondary | ICD-10-CM | POA: Diagnosis present

## 2024-01-17 DIAGNOSIS — Z87891 Personal history of nicotine dependence: Secondary | ICD-10-CM | POA: Insufficient documentation

## 2024-01-17 DIAGNOSIS — Z79810 Long term (current) use of selective estrogen receptor modulators (SERMs): Secondary | ICD-10-CM | POA: Diagnosis not present

## 2024-01-17 DIAGNOSIS — Z13820 Encounter for screening for osteoporosis: Secondary | ICD-10-CM

## 2024-01-17 DIAGNOSIS — Z923 Personal history of irradiation: Secondary | ICD-10-CM | POA: Insufficient documentation

## 2024-01-17 LAB — CMP (CANCER CENTER ONLY)
ALT: 16 U/L (ref 0–44)
AST: 17 U/L (ref 15–41)
Albumin: 4.3 g/dL (ref 3.5–5.0)
Alkaline Phosphatase: 47 U/L (ref 38–126)
Anion gap: 9 (ref 5–15)
BUN: 23 mg/dL (ref 8–23)
CO2: 27 mmol/L (ref 22–32)
Calcium: 9.1 mg/dL (ref 8.9–10.3)
Chloride: 104 mmol/L (ref 98–111)
Creatinine: 0.87 mg/dL (ref 0.44–1.00)
GFR, Estimated: >60 mL/min (ref >60)
Glucose, Bld: 128 mg/dL — ABNORMAL HIGH (ref 70–99)
Potassium: 3.5 mmol/L (ref 3.5–5.1)
Sodium: 140 mmol/L (ref 135–145)
Total Bilirubin: 0.6 mg/dL (ref 0.0–1.2)
Total Protein: 6.8 g/dL (ref 6.5–8.1)

## 2024-01-17 LAB — CBC WITH DIFFERENTIAL (CANCER CENTER ONLY)
Abs Immature Granulocytes: 0 10*3/uL (ref 0.00–0.07)
Basophils Absolute: 0 10*3/uL (ref 0.0–0.1)
Basophils Relative: 1 %
Eosinophils Absolute: 0.2 10*3/uL (ref 0.0–0.5)
Eosinophils Relative: 4 %
HCT: 38.5 % (ref 36.0–46.0)
Hemoglobin: 13.4 g/dL (ref 12.0–15.0)
Immature Granulocytes: 0 %
Lymphocytes Relative: 18 %
Lymphs Abs: 0.7 10*3/uL (ref 0.7–4.0)
MCH: 29.8 pg (ref 26.0–34.0)
MCHC: 34.8 g/dL (ref 30.0–36.0)
MCV: 85.6 fL (ref 80.0–100.0)
Monocytes Absolute: 0.5 10*3/uL (ref 0.1–1.0)
Monocytes Relative: 13 %
Neutro Abs: 2.6 10*3/uL (ref 1.7–7.7)
Neutrophils Relative %: 64 %
Platelet Count: 210 10*3/uL (ref 150–400)
RBC: 4.5 MIL/uL (ref 3.87–5.11)
RDW: 13.7 % (ref 11.5–15.5)
WBC Count: 4 10*3/uL (ref 4.0–10.5)
nRBC: 0 % (ref 0.0–0.2)

## 2024-01-17 NOTE — Progress Notes (Signed)
 SURVIVORSHIP VISIT:  BRIEF ONCOLOGIC HISTORY:  Oncology History  Ductal carcinoma in situ (DCIS) of right breast  08/01/2023 Initial Diagnosis   Ductal carcinoma in situ (DCIS) of right breast   08/03/2023 Cancer Staging   Staging form: Breast, AJCC 8th Edition - Clinical stage from 08/03/2023: Stage 0 (cTis (DCIS), cN0, cM0, ER+, PR+) - Signed by Cameron Cea, MD on 08/03/2023 Stage prefix: Initial diagnosis Nuclear grade: G2   08/11/2023 Surgery   Right lumpectomy: Grade 2 DCIS, 4 mm, margins negative, ER 100%, PR 100%   08/13/2023 Genetic Testing   Negative [facility] CustomNext-Cancer +RNAinsight Panel.  Report date is 08/13/2023.   The CustomNext-Cancer+RNAinsight panel offered by [facility] includes sequencing, rearrangement, and RNA analysis for the following 56 genes: AIP, ALK, APC, ATM, BAP1, BARD1, BMPR1A, BRCA1, BRCA2, BRIP1, CDH1, CDK4, CDKN1B, CDKN2A, CHEK2, DICER1, FH, FLCN, MEN1, MET, MLH1, MSH2, MSH6, MUTYH, NF1, NF2, NTHL1, PALB2, PHOX2B, PMS2, POT1, PRKAR1A, PTCH1, PTEN, RAD51C, RAD51D, RB1, SMAD4, SMARCA4, SMARCB1, SMARCE1, STK11, SUFU, TP53, TSC1, TSC2 and VHL (sequencing and deletion/duplication); AXIN2, HOXB13, MBD4, MITF, MSH3, POLD1 and POLE (sequencing only); EPCAM and GREM1 (deletion/duplication only).   09/26/2023 - 10/21/2023 Radiation Therapy   Plan Name: Breast_R Site: Breast, Right Technique: 3D Mode: Photon Dose Per Fraction: 2.67 Gy Prescribed Dose (Delivered / Prescribed): 42.72 Gy / 42.72 Gy Prescribed Fxs (Delivered / Prescribed): 16 / 16   Plan Name: Breast_R_Bst Site: Breast, Right Technique: Electron Mode: Electron Dose Per Fraction: 2.5 Gy Prescribed Dose (Delivered / Prescribed): 7.5 Gy / 7.5 Gy Prescribed Fxs (Delivered / Prescribed): 3 / 3   Plan Name: Breast_R_Bst:1 Site: Breast, Right Technique: Electron Mode: Electron Dose Per Fraction: 2.5 Gy Prescribed Dose (Delivered / Prescribed): 2.5 Gy / 2.5 Gy Prescribed Fxs (Delivered /  Prescribed): 1 / 1   10/2023 -  Anti-estrogen oral therapy   Tamoxifen  x 5 years     INTERVAL HISTORY:  Miranda Marquez to review her survivorship care plan detailing her treatment course for breast cancer, as well as monitoring long-term side effects of that treatment, education regarding health maintenance, screening, and overall wellness and health promotion.     Overall, Miranda Marquez reports feeling quite well.    REVIEW OF SYSTEMS:  Review of Systems  Constitutional:  Negative for appetite change, chills, fatigue, fever and unexpected weight change.  HENT:   Negative for hearing loss, lump/mass and trouble swallowing.   Eyes:  Negative for eye problems and icterus.  Respiratory:  Negative for chest tightness, cough and shortness of breath.   Cardiovascular:  Negative for chest pain, leg swelling and palpitations.  Gastrointestinal:  Negative for abdominal distention, abdominal pain, constipation, diarrhea, nausea and vomiting.  Endocrine: Positive for hot flashes.  Genitourinary:  Negative for difficulty urinating.   Musculoskeletal:  Negative for arthralgias.  Skin:  Negative for itching and rash.  Neurological:  Negative for dizziness, extremity weakness, headaches and numbness.  Hematological:  Negative for adenopathy. Does not bruise/bleed easily.  Psychiatric/Behavioral:  Negative for depression. The patient is not nervous/anxious.    Breast: Denies any new nodularity, masses, tenderness, nipple changes, or nipple discharge.       PAST MEDICAL/SURGICAL HISTORY:  Past Medical History:  Diagnosis Date   Arthritis    Breast cancer (HCC)    Cervical herniated disc    Complication of anesthesia    hypotension  in april after hysterectomy   History of radiation therapy    Right breast- 09/26/23-10/21/23- Dr. Retta Caster   Hypertension  not taking meds    Pituitary microadenoma with hyperprolactinemia (HCC)    Psoriasis both elbows, scalp, left thigh, saw dr Fleurette Humbles, last  week given cream for areas healing   Sleep apnea    never had study done, but things she does have it   Past Surgical History:  Procedure Laterality Date   ABDOMINAL HYSTERECTOMY     APPENDECTOMY     BREAST BIOPSY Right 10/11/2014   BREAST BIOPSY Right 07/26/2023   MM RT BREAST BX W LOC DEV 1ST LESION IMAGE BX SPEC STEREO GUIDE 07/26/2023 [facility]   BREAST BIOPSY  08/10/2023   MM RT RADIOACTIVE SEED LOC MAMMO GUIDE 08/10/2023 [facility]   BREAST EXCISIONAL BIOPSY Right 07/2015   BREAST EXCISIONAL BIOPSY Right    BREAST LUMPECTOMY WITH RADIOACTIVE SEED LOCALIZATION Right 08/07/2015   Procedure: RIGHT BREAST LUMPECTOMY WITH RADIOACTIVE SEED LOCALIZATION AND EXCISION OF BREAST MASS X2;  Surgeon: Oza Blumenthal, MD;  Location: [facility] OR;  Service: General;  Laterality: Right;   BREAST LUMPECTOMY WITH RADIOACTIVE SEED LOCALIZATION Right 08/11/2023   Procedure: RIGHT BREAST SEED LUMPECTOMY;  Surgeon: Sim Dryer, MD;  Location: [facility] OR;  Service: General;  Laterality: Right;   BREAST SURGERY     DILATION AND CURETTAGE OF UTERUS     FOOT SURGERY     left heel     HERNIA REPAIR     umbilical   MAXIMUM ACCESS (MAS)POSTERIOR LUMBAR INTERBODY FUSION (PLIF) 1 LEVEL N/A 09/09/2016   Procedure: MAXIMUM ACCESS SURGERY POSTERIOR LUMBAR INTERBODY FUSION LUMBAR THREE-FOUR;  Surgeon: Isadora Mar, MD;  Location[facility];  Service: Neurosurgery;  Laterality: N/A;   MINI-LAPAROTOMY W/ TUBAL LIGATION     ROBOTIC ASSISTED TOTAL HYSTERECTOMY WITH BILATERAL SALPINGO OOPHERECTOMY Bilateral 12/03/2014   Procedure: ROBOTIC ASSISTED TOTAL HYSTERECTOMY WITH BILATERAL SALPINGO OOPHORECTOMY;  Surgeon: Alphonso Aschoff, MD;  Location: [facility];  Service: Gynecology;  Laterality: Bilateral;   TOTAL HIP ARTHROPLASTY Right 10/18/2017   Procedure: RIGHT TOTAL HIP ARTHROPLASTY ANTERIOR APPROACH;  Surgeon: Claiborne Crew, MD;  Location: [facility];  Service: Orthopedics;  Laterality: Right;  70 mins   TUBAL LIGATION        ALLERGIES:  Allergies  Allergen Reactions   Latex Dermatitis   Nickel Dermatitis    Causes irritation    Other Dermatitis    Steri Strips    Silicone Dermatitis     CURRENT MEDICATIONS:  Outpatient Encounter Medications as of 01/17/2024  Medication Sig   albuterol (VENTOLIN HFA) 108 (90 Base) MCG/ACT inhaler SMARTSIG:2 Puff(s) By Mouth Every 4 Hours PRN   BD PEN NEEDLE NANO 2ND GEN 32G X 4 MM MISC USE AS DIRECTED   clobetasol (TEMOVATE) 0.05 % external solution SMARTSIG:sparingly Twice Daily PRN   dapagliflozin  propanediol (FARXIGA ) 10 MG TABS tablet Take 1 tablet (10 mg total) by mouth daily before breakfast.   diclofenac (VOLTAREN) 75 MG EC tablet Take 75 mg by mouth daily as needed for moderate pain (pain score 4-6).   glucose blood (ONETOUCH VERIO) test strip Use as instructed   hydrochlorothiazide  (HYDRODIURIL ) 25 MG tablet Take 1 tablet (25 mg total) by mouth daily.   insulin  glargine (LANTUS  SOLOSTAR) 100 UNIT/ML Solostar Pen Inject 10 Units into the skin daily.   latanoprost (XALATAN) 0.005 % ophthalmic solution Place 1 drop into both eyes at bedtime.   metFORMIN  (GLUCOPHAGE -XR) 500 MG 24 hr tablet TAKE 2 TABLETS (1,000 MG TOTAL) BY MOUTH IN THE MORNING AND AT BEDTIME.   rosuvastatin  (CRESTOR ) 20 MG tablet  Take 1 tablet (20 mg total) by mouth daily.   Sod Picosulfate-Mag Ox-Cit Acd (CLENPIQ) 10-3.5-12 MG-GM -GM/175ML SOLN 175 ML ORALLY TWICE for 2   tamoxifen  (NOLVADEX ) 10 MG tablet Take 1 tablet (10 mg total) by mouth daily.   tirzepatide  (MOUNJARO ) 2.5 MG/0.5ML Pen Inject 2.5 mg into the skin once a week.   valsartan  (DIOVAN ) 320 MG tablet Take 1 tablet (320 mg total) by mouth daily.   No facility-administered encounter medications on file as of 01/17/2024.     ONCOLOGIC FAMILY HISTORY:  Family History  Problem Relation Age of Onset   Cancer Father        skin   Melanoma Father 50       arm   Hypertension Father    Prostate cancer Father 50       mets in  70s   Cancer Paternal Aunt        breast   Breast cancer Paternal Aunt 41   Lung cancer Maternal Grandfather 21   Cancer Paternal Grandfather        skin   Melanoma Paternal Grandfather 6       mets   Other Daughter 2       optic glioma   Cancer Cousin        prostate   Prostate cancer Cousin 42     SOCIAL HISTORY:  Social History   Socioeconomic History   Marital status: Married    Spouse name: Not on file   Number of children: Not on file   Years of education: Not on file   Highest education level: Not on file  Occupational History   Not on file  Tobacco Use   Smoking status: Former    Current packs/day: 0.00    Types: Cigarettes    Quit date: 08/23/1978    Years since quitting: 45.4   Smokeless tobacco: Never  Vaping Use   Vaping status: Never Used  Substance and Sexual Activity   Alcohol use: Yes    Alcohol/week: 2.0 standard drinks of alcohol    Types: 2 Glasses of wine per week    Comment: occasional, weekends   Drug use: No   Sexual activity: Yes  Other Topics Concern   Not on file  Social History Narrative   Not on file   Social Drivers of Health   Financial Resource Strain: Not on file  Food Insecurity: No Food Insecurity (09/08/2023)   Hunger Vital Sign    Worried About Running Out of Food in the Last Year: Never true    Ran Out of Food in the Last Year: Never true  Transportation Needs: No Transportation Needs (09/08/2023)   PRAPARE - Administrator, Civil Service (Medical): No    Lack of Transportation (Non-Medical): No  Physical Activity: Not on file  Stress: Not on file  Social Connections: Not on file  Intimate Partner Violence: Not At Risk (09/08/2023)   Humiliation, Afraid, Rape, and Kick questionnaire    Fear of Current or Ex-Partner: No    Emotionally Abused: No    Physically Abused: No    Sexually Abused: No     OBSERVATIONS/OBJECTIVE:  BP 118/65 (BP Location: Left Arm, Patient Position: Sitting)   Pulse 72   Temp  98.3 F (36.8 C) (Temporal)   Resp 17   Wt 267 lb 9.6 oz (121.4 kg)   SpO2 98%   BMI 40.10 kg/m  GENERAL: Patient is a well appearing female in no acute  distress HEENT:  Sclerae anicteric.  Oropharynx clear and moist. No ulcerations or evidence of oropharyngeal candidiasis. Neck is supple.  NODES:  No cervical, supraclavicular, or axillary lymphadenopathy palpated.  BREAST EXAM:  right breast s/p lumpectomy and radiation, no sign of local recurrence, left breast benign LUNGS:  Clear to auscultation bilaterally.  No wheezes or rhonchi. HEART:  Regular rate and rhythm. No murmur appreciated. ABDOMEN:  Soft, nontender.  Positive, normoactive bowel sounds. No organomegaly palpated. MSK:  No focal spinal tenderness to palpation. Full range of motion bilaterally in the upper extremities. EXTREMITIES:  No peripheral edema.   SKIN:  Clear with no obvious rashes or skin changes. No nail dyscrasia. NEURO:  Nonfocal. Well oriented.  Appropriate affect.   LABORATORY DATA:  None for this visit.  DIAGNOSTIC IMAGING:  None for this visit.      ASSESSMENT AND PLAN:  Ms.. Marquez is a pleasant 63 y.o. female with Stage 0 right breast DCIS, ER+/PR+, diagnosed in 07/2023, treated with lumpectomy, adjuvant radiation therapy, and anti-estrogen therapy with Tamoxifen  beginning in 10/2023.  She presents to the Survivorship Clinic for our initial meeting and routine follow-up post-completion of treatment for breast cancer.    1. Stage 0 right breast cancer:  Miranda Marquez is continuing to recover from definitive treatment for breast cancer. She will follow-up with her medical oncologist, Dr.  Lee Public in 6 months with history and physical exam per surveillance protocol.  She will continue her anti-estrogen therapy with Tamoxifen . Thus far, she is tolerating the Tamoxifen  well, with minimal side effects. Her mammogram is due 06/2024; orders placed today.   Today, a comprehensive survivorship care plan and treatment  summary was reviewed with the patient today detailing her breast cancer diagnosis, treatment course, potential late/long-term effects of treatment, appropriate follow-up care with recommendations for the future, and patient education resources.  A copy of this summary, along with a letter will be sent to the patient's primary care provider via mail/fax/In Basket message after today's visit.    2. Bone health: She has never undergone any bone density testing therefore I placed orders for this to occur in November when she undergoes her mammogram.  She was given education on specific activities to promote bone health.  3. Cancer screening:  Due to Miranda Marquez's history and her age, she should receive screening for skin cancers, colon cancer, and gynecologic cancers.  The information and recommendations are listed on the patient's comprehensive care plan/treatment summary and were reviewed in detail with the patient.    4. Health maintenance and wellness promotion: Miranda Marquez was encouraged to consume 5-7 servings of fruits and vegetables per day. We reviewed the "Nutrition Rainbow" handout.  She was also encouraged to engage in moderate to vigorous exercise for 30 minutes per day most days of the week.  She was instructed to limit her alcohol consumption and continue to abstain from tobacco use.     5. Support services/counseling: It is not uncommon for this period of the patient's cancer care trajectory to be one of many emotions and stressors.   She was given information regarding our available services and encouraged to contact me with any questions or for help enrolling in any of our support group/programs.    Follow up instructions:    -Return to cancer center 6 months for f/u with Dr. Lee Public   -Mammogram due in 06/2024 -DEXA in 06/2024 -She is welcome to return back to the Survivorship Clinic at any time; no additional follow-up needed  at this time.  -Consider referral back to survivorship as a  long-term survivor for continued surveillance  The patient was provided an opportunity to ask questions and all were answered. The patient agreed with the plan and demonstrated an understanding of the instructions.   Total encounter time:45 minutes*in face-to-face visit time, chart review, lab review, care coordination, order entry, and documentation of the encounter time.    Alwin Baars, NP 01/17/24 11:39 AM Medical Oncology and Hematology [facility] [address] [place], [place] [address] Tel. [phone]    Fax. [phone]  *Total Encounter Time as defined by the Centers for Medicare and Medicaid Services includes, in addition to the face-to-face time of a patient visit (documented in the note above) non-face-to-face time: obtaining and reviewing outside history, ordering and reviewing medications, tests or procedures, care coordination (communications with other health care professionals or caregivers) and documentation in the medical record.

## 2024-04-16 ENCOUNTER — Other Ambulatory Visit: Payer: Self-pay | Admitting: Internal Medicine

## 2024-04-16 DIAGNOSIS — E1165 Type 2 diabetes mellitus with hyperglycemia: Secondary | ICD-10-CM

## 2024-04-23 ENCOUNTER — Emergency Department (HOSPITAL_BASED_OUTPATIENT_CLINIC_OR_DEPARTMENT_OTHER)

## 2024-04-23 ENCOUNTER — Encounter (HOSPITAL_BASED_OUTPATIENT_CLINIC_OR_DEPARTMENT_OTHER): Payer: Self-pay

## 2024-04-23 ENCOUNTER — Other Ambulatory Visit: Payer: Self-pay

## 2024-04-23 ENCOUNTER — Emergency Department (HOSPITAL_BASED_OUTPATIENT_CLINIC_OR_DEPARTMENT_OTHER)
Admission: EM | Admit: 2024-04-23 | Discharge: 2024-04-23 | Disposition: A | Discharge status: Home / Self Care | Attending: Emergency Medicine | Admitting: Emergency Medicine

## 2024-04-23 DIAGNOSIS — I10 Essential (primary) hypertension: Secondary | ICD-10-CM | POA: Insufficient documentation

## 2024-04-23 DIAGNOSIS — Z79899 Other long term (current) drug therapy: Secondary | ICD-10-CM | POA: Insufficient documentation

## 2024-04-23 DIAGNOSIS — Z7984 Long term (current) use of oral hypoglycemic drugs: Secondary | ICD-10-CM | POA: Insufficient documentation

## 2024-04-23 DIAGNOSIS — Z91040 Latex allergy status: Secondary | ICD-10-CM | POA: Insufficient documentation

## 2024-04-23 DIAGNOSIS — R109 Unspecified abdominal pain: Secondary | ICD-10-CM | POA: Diagnosis present

## 2024-04-23 DIAGNOSIS — E119 Type 2 diabetes mellitus without complications: Secondary | ICD-10-CM | POA: Diagnosis not present

## 2024-04-23 DIAGNOSIS — R110 Nausea: Secondary | ICD-10-CM | POA: Diagnosis not present

## 2024-04-23 DIAGNOSIS — Z794 Long term (current) use of insulin: Secondary | ICD-10-CM | POA: Diagnosis not present

## 2024-04-23 LAB — URINALYSIS, ROUTINE W REFLEX MICROSCOPIC
Bilirubin Urine: NEGATIVE
Glucose, UA: >1000 mg/dL — AB
Leukocytes,Ua: NEGATIVE
Nitrite: NEGATIVE
Specific Gravity, Urine: 1.031 — ABNORMAL HIGH (ref 1.005–1.030)
pH: 5.5 (ref 5.0–8.0)

## 2024-04-23 LAB — COMPREHENSIVE METABOLIC PANEL WITH GFR
ALT: 16 U/L (ref 0–44)
AST: 15 U/L (ref 15–41)
Albumin: 4.5 g/dL (ref 3.5–5.0)
Alkaline Phosphatase: 59 U/L (ref 38–126)
Anion gap: 15 (ref 5–15)
BUN: 19 mg/dL (ref 8–23)
CO2: 25 mmol/L (ref 22–32)
Calcium: 9.6 mg/dL (ref 8.9–10.3)
Chloride: 100 mmol/L (ref 98–111)
Creatinine, Ser: 0.98 mg/dL (ref 0.44–1.00)
GFR, Estimated: >60 mL/min (ref >60)
Glucose, Bld: 125 mg/dL — ABNORMAL HIGH (ref 70–99)
Potassium: 3.3 mmol/L — ABNORMAL LOW (ref 3.5–5.1)
Sodium: 140 mmol/L (ref 135–145)
Total Bilirubin: 0.7 mg/dL (ref 0.0–1.2)
Total Protein: 7.1 g/dL (ref 6.5–8.1)

## 2024-04-23 LAB — CBC
HCT: 38.4 % (ref 36.0–46.0)
Hemoglobin: 13 g/dL (ref 12.0–15.0)
MCH: 29.8 pg (ref 26.0–34.0)
MCHC: 33.9 g/dL (ref 30.0–36.0)
MCV: 88.1 fL (ref 80.0–100.0)
Platelets: 210 K/uL (ref 150–400)
RBC: 4.36 MIL/uL (ref 3.87–5.11)
RDW: 13.8 % (ref 11.5–15.5)
WBC: 7.3 K/uL (ref 4.0–10.5)
nRBC: 0 % (ref 0.0–0.2)

## 2024-04-23 LAB — LIPASE, BLOOD: Lipase: 19 U/L (ref 11–51)

## 2024-04-23 MED ORDER — IOHEXOL 350 MG/ML SOLN
100.0000 mL | Freq: Once | INTRAVENOUS | Status: AC | PRN
  Administered 2024-04-23: 100 mL via INTRAVENOUS

## 2024-04-23 MED ORDER — CYCLOBENZAPRINE HCL 10 MG PO TABS: 10.0000 mg | ORAL_TABLET | Freq: Two times a day (BID) | ORAL | 0 refills | Status: DC | PRN

## 2024-04-23 MED ORDER — MORPHINE SULFATE (PF) 4 MG/ML IV SOLN
4.0000 mg | Freq: Once | INTRAVENOUS | Status: AC
  Administered 2024-04-23: 4 mg via INTRAVENOUS
  Filled 2024-04-23: qty 1

## 2024-04-23 MED ORDER — TRAMADOL HCL 50 MG PO TABS: 50.0000 mg | ORAL_TABLET | Freq: Four times a day (QID) | ORAL | 0 refills | Status: DC | PRN

## 2024-04-23 NOTE — ED Triage Notes (Signed)
 Patient reports left sided flank pain starting Saturday night. She states that [facility] sent her here as they believe she has a kidney stone however there is no history of this in the past. Reports the pain as a knife radiating in the side.

## 2024-04-23 NOTE — Discharge Instructions (Signed)
 You have been evaluated for your symptoms.  Fortunately CT scans today did not show any concerning finding.  Monitor your skin for any signs of rash which may appears at the site of your pain.  If you notice this rash please return or follow-up with your doctor as it could be shingles causing pain.  Take medication prescribed as needed for symptom control.  Return to the ER if you have any concern.

## 2024-04-23 NOTE — ED Notes (Signed)
 Patient transported to CT

## 2024-04-23 NOTE — ED Provider Notes (Signed)
 [place] EMERGENCY DEPARTMENT AT [facility] Provider Note   CSN: [id number] Arrival date & time: 04/23/24  1512     Patient presents with: Flank Pain (Left)   Miranda Marquez is a 63 y.o. female.   The history is provided by the patient and medical records. No language interpreter was used.  Flank Pain     63 year old female with history of hypertension, diabetes, obesity, ascending aortic dilatation presenting with complaints of flank pain.  Patient report having left flank pain that started about 2 days ago.  Pain is constant pain sharp achy moderate intensity, radiates towards left side of the abdomen.  She felt some nausea without any vomiting.  She did notice that her urine is more cloudy than usual.  She denies having fever or chills no chest pain or shortness of breath no lightheadedness or dizziness no burning sensation when urinating.  No prior history of kidney stone.  She tries taking her NSAIDs at home with minimal relief.  Denies any focal numbness or focal weakness.  Prior to Admission medications   Medication Sig Start Date End Date Taking? Authorizing Provider  albuterol (VENTOLIN HFA) 108 (90 Base) MCG/ACT inhaler SMARTSIG:2 Puff(s) By Mouth Every 4 Hours PRN 12/12/23   [provider]  clobetasol (TEMOVATE) 0.05 % external solution SMARTSIG:sparingly Twice Daily PRN 08/22/23   [provider]  dapagliflozin  propanediol (FARXIGA ) 10 MG TABS tablet Take 1 tablet (10 mg total) by mouth daily before breakfast. 12/29/23   Shamleffer, Ibtehal Jaralla, MD  diclofenac (VOLTAREN) 75 MG EC tablet Take 75 mg by mouth daily as needed for moderate pain (pain score 4-6). 07/05/22   [provider]  glucose blood (ONETOUCH VERIO) test strip Use as instructed 01/16/21   Shamleffer, Donell Cardinal, MD  hydrochlorothiazide  (HYDRODIURIL ) 25 MG tablet Take 1 tablet (25 mg total) by mouth daily. 10/07/23   Lonni Slain, MD  insulin  glargine (LANTUS   SOLOSTAR) 100 UNIT/ML Solostar Pen Inject 10 Units into the skin daily. 12/29/23   Shamleffer, Ibtehal Jaralla, MD  Insulin  Pen Needle (EMBECTA PEN NEEDLE NANO 2 GEN) 32G X 4 MM MISC USE AS DIRECTED 04/16/24   Shamleffer, Ibtehal Jaralla, MD  latanoprost (XALATAN) 0.005 % ophthalmic solution Place 1 drop into both eyes at bedtime.    [provider]  metFORMIN  (GLUCOPHAGE -XR) 500 MG 24 hr tablet TAKE 2 TABLETS (1,000 MG TOTAL) BY MOUTH IN THE MORNING AND AT BEDTIME. 01/10/24   Shamleffer, Ibtehal Jaralla, MD  rosuvastatin  (CRESTOR ) 20 MG tablet Take 1 tablet (20 mg total) by mouth daily. 08/30/23   Shamleffer, Ibtehal Jaralla, MD  Sod Picosulfate-Mag Ox-Cit Acd (CLENPIQ) 10-3.5-12 MG-GM -GM/175ML SOLN 175 ML ORALLY TWICE for 2 06/24/22   [provider]  tamoxifen  (NOLVADEX ) 10 MG tablet Take 1 tablet (10 mg total) by mouth daily. 10/20/23   Gudena, Vinay, MD  tirzepatide  (MOUNJARO ) 2.5 MG/0.5ML Pen Inject 2.5 mg into the skin once a week. 12/29/23   Shamleffer, Ibtehal Jaralla, MD  valsartan  (DIOVAN ) 320 MG tablet Take 1 tablet (320 mg total) by mouth daily. 08/16/23   Lonni Slain, MD    Allergies: Latex, Nickel, Other, and Silicone    Review of Systems  Genitourinary:  Positive for flank pain.  All other systems reviewed and are negative.   Updated Vital Signs BP 131/62 (BP Location: Right Arm)   Pulse 89   Temp 98.1 F (36.7 C)   Resp 17   Ht 5' 8.5 (1.74 m)   Wt 113.4  kg   SpO2 97%   BMI 37.46 kg/m   Physical Exam Vitals and nursing note reviewed.  Constitutional:      General: She is not in acute distress.    Appearance: She is well-developed.  HENT:     Head: Atraumatic.  Eyes:     Conjunctiva/sclera: Conjunctivae normal.  Cardiovascular:     Rate and Rhythm: Normal rate and regular rhythm.     Pulses: Normal pulses.     Heart sounds: Normal heart sounds.  Pulmonary:     Effort: Pulmonary effort is normal.  Abdominal:     Palpations: Abdomen is  soft.     Tenderness: There is no abdominal tenderness. There is left CVA tenderness. There is no right CVA tenderness.  Musculoskeletal:     Cervical back: Neck supple.  Skin:    Capillary Refill: Capillary refill takes less than 2 seconds.     Findings: No rash.  Neurological:     Mental Status: She is alert.  Psychiatric:        Mood and Affect: Mood normal.     (all labs ordered are listed, but only abnormal results are displayed) Labs Reviewed  COMPREHENSIVE METABOLIC PANEL WITH GFR - Abnormal; Notable for the following components:      Result Value   Potassium 3.3 (*)    Glucose, Bld 125 (*)    All other components within normal limits  URINALYSIS, ROUTINE W REFLEX MICROSCOPIC - Abnormal; Notable for the following components:   Specific Gravity, Urine 1.031 (*)    Glucose, UA >1,000 (*)    Hgb urine dipstick TRACE (*)    Ketones, ur TRACE (*)    Protein, ur TRACE (*)    Bacteria, UA RARE (*)    All other components within normal limits  LIPASE, BLOOD  CBC    EKG: None  Radiology: CT Angio Chest/Abd/Pel for Dissection W and/or Wo Contrast Result Date: 04/23/2024 CLINICAL DATA:  Acute aortic syndrome suspected. EXAM: CT ANGIOGRAPHY CHEST, ABDOMEN AND PELVIS TECHNIQUE: Non-contrast CT of the chest was initially obtained. Multidetector CT imaging through the chest, abdomen and pelvis was performed using the standard protocol during bolus administration of intravenous contrast. Multiplanar reconstructed images and MIPs were obtained and reviewed to evaluate the vascular anatomy. RADIATION DOSE REDUCTION: This exam was performed according to the departmental dose-optimization program which includes automated exposure control, adjustment of the mA and/or kV according to patient size and/or use of iterative reconstruction technique. CONTRAST:  OMNIPAQUE  IOHEXOL  350 MG/ML SOLN COMPARISON:  None Available. FINDINGS: CTA CHEST FINDINGS Cardiovascular: Preferential opacification  of the thoracic aorta. No evidence of thoracic aortic aneurysm or dissection. Normal heart size. No pericardial effusion. Origin of the great vessels appears within normal limits. There is mild calcified atherosclerotic disease of the aorta and coronary arteries. Mediastinum/Nodes: No enlarged mediastinal, hilar, or axillary lymph nodes. Thyroid  gland, trachea, and esophagus demonstrate no significant findings. Lungs/Pleura: There some minimal patchy ground-glass and airspace opacities in the right middle lobe. There is a single 2 mm nodule in the right lower lobe image 6/67. The lungs are otherwise clear. There is a trace left pleural effusion with minimal left basilar atelectasis. Musculoskeletal: No acute osseous abnormality. There are postsurgical changes in the anterior right chest with some subcutaneous density measuring 1.3 x 1.4 by 3.5 cm. Review of the MIP images confirms the above findings. CTA ABDOMEN AND PELVIS FINDINGS VASCULAR Aorta: Normal caliber aorta without aneurysm, dissection, vasculitis or significant stenosis. Celiac: Patent  without evidence of aneurysm, dissection, vasculitis or significant stenosis. SMA: Patent without evidence of aneurysm, dissection, vasculitis or significant stenosis. Renals: Both renal arteries are patent without evidence of aneurysm, dissection, vasculitis, fibromuscular dysplasia or significant stenosis. IMA: Patent without evidence of aneurysm, dissection, vasculitis or significant stenosis. Inflow: Patent without evidence of aneurysm, dissection, vasculitis or significant stenosis. Veins: No obvious venous abnormality within the limitations of this arterial phase study. Review of the MIP images confirms the above findings. NON-VASCULAR Hepatobiliary: No focal liver abnormality is seen. No gallstones, gallbladder wall thickening, or biliary dilatation. Pancreas: Unremarkable. No pancreatic ductal dilatation or surrounding inflammatory changes. Spleen: Normal in size  without focal abnormality. Adrenals/Urinary Tract: The bladder is not well seen secondary to streak artifact in the pelvis. There are likely small left peripelvic cysts. Otherwise, the bilateral kidneys appear within normal limits. The adrenal glands are within normal limits. Stomach/Bowel: Stomach is within normal limits. No evidence of bowel wall thickening, distention, or inflammatory changes. There is sigmoid colon diverticulosis. The appendix is likely surgically absent. Lymphatic: No enlarged lymph nodes are seen. Reproductive: Uterus and bilateral adnexa are unremarkable. Other: No abdominal wall hernia or abnormality. No abdominopelvic ascites. Musculoskeletal: Posterior fusion hardware is seen at L3-L4. Bilateral hip arthroplasties are present. Review of the MIP images confirms the above findings. IMPRESSION: 1. No evidence for aortic dissection or aneurysm. 2. Minimal patchy ground-glass and airspace opacities in the right middle lobe may be infectious/inflammatory. 3. Trace left pleural effusion. 4. Single 2 mm right lower lobe nodule. No follow-up needed if patient is low-risk.This recommendation follows the consensus statement: Guidelines for Management of Incidental Pulmonary Nodules Detected on CT Images: From the Fleischner Society 2017; Radiology 2017; 284:228-243. 5. No acute localizing process in the abdomen or pelvis. 6. Sigmoid colon diverticulosis. 7. Postsurgical changes in the anterior right chest wall. Aortic Atherosclerosis (ICD10-I70.0). Electronically Signed   By: Greig Pique M.D.   On: 04/23/2024 20:05   CT Renal Stone Study Result Date: 04/23/2024 CLINICAL DATA:  Abdominal and flank pain. EXAM: CT ABDOMEN AND PELVIS WITHOUT CONTRAST TECHNIQUE: Multidetector CT imaging of the abdomen and pelvis was performed following the standard protocol without IV contrast. RADIATION DOSE REDUCTION: This exam was performed according to the departmental dose-optimization program which includes  automated exposure control, adjustment of the mA and/or kV according to patient size and/or use of iterative reconstruction technique. COMPARISON:  CT abdomen and pelvis 09/25/2009 FINDINGS: Lower chest: No acute abnormality. Hepatobiliary: No focal liver abnormality is seen. No gallstones, gallbladder wall thickening, or biliary dilatation. Pancreas: Unremarkable. No pancreatic ductal dilatation or surrounding inflammatory changes. Spleen: Normal in size without focal abnormality. Adrenals/Urinary Tract: The distal ureters and bladder are not well evaluated secondary to streak artifact in the pelvis. No renal calculi or hydronephrosis. The adrenal glands are within normal limits. Left peripelvic cysts are again seen. Stomach/Bowel: Stomach is within normal limits. Appendix is not seen. There is sigmoid colon diverticulosis. No evidence of bowel wall thickening, distention, or inflammatory changes. Vascular/Lymphatic: Aortic atherosclerosis. No enlarged abdominal or pelvic lymph nodes. Reproductive: Status post hysterectomy. No adnexal masses. Other: No abdominal wall hernia or abnormality. No abdominopelvic ascites. Musculoskeletal: Posterior fusion hardware seen L3-L4. Bilateral hip arthroplasties are present. No acute fractures are seen. IMPRESSION: 1. No acute localizing process in the abdomen or pelvis. 2. Colonic diverticulosis without evidence for diverticulitis. 3. Aortic atherosclerosis. Aortic Atherosclerosis (ICD10-I70.0). Electronically Signed   By: Greig Pique M.D.   On: 04/23/2024 18:41  Procedures   Medications Ordered in the ED  morphine  (PF) 4 MG/ML injection 4 mg (4 mg Intravenous Given 04/23/24 1804)  iohexol  (OMNIPAQUE ) 350 MG/ML injection 100 mL (100 mLs Intravenous Contrast Given 04/23/24 1902)                                    Medical Decision Making Amount and/or Complexity of Data Reviewed Labs: ordered. Radiology: ordered.  Risk Prescription drug management.   BP  131/62 (BP Location: Right Arm)   Pulse 89   Temp 98.1 F (36.7 C)   Resp 17   Ht 5' 8.5 (1.74 m)   Wt 113.4 kg   SpO2 97%   BMI 37.46 kg/m   67:47 PM 63 year old female with history of hypertension, diabetes, obesity, ascending aortic dilatation presenting with complaints of flank pain.  Patient report having left flank pain that started about 2 days ago.  Pain is constant pain sharp achy moderate intensity, radiates towards left side of the abdomen.  She felt some nausea without any vomiting.  She did notice that her urine is more cloudy than usual.  She denies having fever or chills no chest pain or shortness of breath no lightheadedness or dizziness no burning sensation when urinating.  No prior history of kidney stone.  She tries taking her NSAIDs at home with minimal relief.  Denies any focal numbness or focal weakness.  On exam, patient is sitting in the chair appears to be in no acute discomfort.  She does have CVA tenderness to the left suggestive of kidney stone.  Will obtain CT renal stone study for further assessment.  However if it is negative, will consider dissection study as patient does have known history of aortic root dilatation.  -Labs ordered, independently viewed and interpreted by me.  Labs remarkable for UA shows trace hemoglobin but no signs of urinary tract infection. -The patient was maintained on a cardiac monitor.  I personally viewed and interpreted the cardiac monitored which showed an underlying rhythm of: Normal sinus rhythm -Imaging independently viewed and interpreted by me and I agree with radiologist's interpretation.  Result remarkable for CT renal stone study without any acute finding however with history of aortic root dilatation, will obtain chest abdomen pelvis CT angiogram to rule out dissection.  CT scan of the chest abdomen pelvis obtained showing no acute finding no evidence of dissection or aneurysm.  Some findings to suggest right sided pneumonia  however patient denies having any significant cough or shortness of breath or fever therefore I have low suspicion for pneumonia. -This patient presents to the ED for concern of flank pain, this involves an extensive number of treatment options, and is a complaint that carries with it a high risk of complications and morbidity.  The differential diagnosis includes kidney stone, pyelonephritis, dissection, shingles, -Co morbidities that complicate the patient evaluation includes aortic root dilation, hypertension, diabetes, obesity -Treatment includes morphine  -Reevaluation of the patient after these medicines showed that the patient improved -PCP office notes or outside notes reviewed -Escalation to admission/observation considered: patients feels much better, is comfortable with discharge, and will follow up with PCP -Prescription medication considered, patient comfortable with muscle relaxant and a short course of opiate pain medication -Social Determinant of Health considered  I expect patient skin and I did not appreciate any concerning shingle rash.  I made patient aware that sometimes early shingles can cause pain before  the rash appears.  She will monitor her skin.  I gave patient return precaution.      Final diagnoses:  Left flank pain    ED Discharge Orders          Ordered    cyclobenzaprine  (FLEXERIL ) 10 MG tablet  2 times daily PRN        04/23/24 2035    traMADol  (ULTRAM ) 50 MG tablet  Every 6 hours PRN        04/23/24 2035               Nivia Colon, PA-C 04/23/24 2036    Armenta Canning, MD 04/30/24 [phone]

## 2024-04-24 ENCOUNTER — Other Ambulatory Visit: Payer: Self-pay | Admitting: Adult Health

## 2024-04-24 DIAGNOSIS — Z13820 Encounter for screening for osteoporosis: Secondary | ICD-10-CM

## 2024-04-26 ENCOUNTER — Encounter (HOSPITAL_COMMUNITY): Payer: Self-pay | Admitting: Adult Health

## 2024-05-01 ENCOUNTER — Encounter: Payer: Self-pay | Admitting: Internal Medicine

## 2024-05-01 ENCOUNTER — Ambulatory Visit: Admitting: Internal Medicine

## 2024-05-01 VITALS — BP 114/72 | HR 68 | Ht 68.5 in | Wt 255.0 lb

## 2024-05-01 DIAGNOSIS — Z794 Long term (current) use of insulin: Secondary | ICD-10-CM

## 2024-05-01 DIAGNOSIS — R7309 Other abnormal glucose: Secondary | ICD-10-CM

## 2024-05-01 DIAGNOSIS — E119 Type 2 diabetes mellitus without complications: Secondary | ICD-10-CM

## 2024-05-01 DIAGNOSIS — E785 Hyperlipidemia, unspecified: Secondary | ICD-10-CM | POA: Diagnosis not present

## 2024-05-01 DIAGNOSIS — Z7984 Long term (current) use of oral hypoglycemic drugs: Secondary | ICD-10-CM | POA: Diagnosis not present

## 2024-05-01 DIAGNOSIS — D352 Benign neoplasm of pituitary gland: Secondary | ICD-10-CM

## 2024-05-01 LAB — POCT GLUCOSE (DEVICE FOR HOME USE): POC Glucose: 145 mg/dL — AB (ref 70–99)

## 2024-05-01 LAB — POCT GLYCOSYLATED HEMOGLOBIN (HGB A1C): Hemoglobin A1C: 6.1 % — AB (ref 4.0–5.6)

## 2024-05-01 NOTE — Progress Notes (Unsigned)
 Name: Miranda Marquez  Age/ Sex: 63 y.o., female   MRN/ DOB: [id number], 15-Oct-1960     PCP: Lynwood Laneta LELON DEVONNA   Reason for Endocrinology Evaluation: Type 2 Diabetes Mellitus  Initial Endocrine Consultative Visit: 01/16/2021    PATIENT IDENTIFIER: Miranda Marquez is a 63 y.o. female with a past medical history of HTN, T2DM, HTN and dyslipidemia. The patient has followed with Endocrinology clinic since 01/16/2021 for consultative assistance with management of her diabetes.  DIABETIC HISTORY:  Miranda Marquez was diagnosed with DM 07/2020.  She had gestations diabetes > 25 yrs ago. Her hemoglobin A1c has ranged from 10.2% in 2022, peaking at 12.4% in 2021  On her initial visit to our clinic she had an A1c of 10.7% she was not on any medications, we started metformin  and insulin    By 04/2021 Farxiga  started    Intolerant to Ozempic  due to GI issues 2023, she was started by cardiology   Started Mounjaro  May, 2025  PITUITARY HISTORY: She has a diagnosis of pituitary microadenoma in 2006. With an MRI showing a 5 mm adenoma . She has a Hx of hyperprolactinemia . She was on Bromocriptine  until 2021 . Repeat Prolactin was normal x 2 in 2022 and we opted to hold off on Bromocriptine .      SUBJECTIVE:   During the last visit (12/29/2023) this was a virtual visit    Today (05/01/2024): Miranda Marquez  is here for a follow up on diabetes management. She checks her blood sugars 2 times daily. The patient has not had hypoglycemic episodes since the last clinic visit.    She had a follow-up with cardiology for coronary artery calcifications and HTN, on Crestor  She is S/P right breast sx 07/2023 for Breast ca .  Continues to follow-up with oncology.  Completed radiation therapy February, 2025.  On antiestrogen therapy, tamoxifen  for 5 years She presented to the ED with left flank pain, 04/23/2024.  Imaging reveals no evidence of kidney stones. Pending further workup   Stopped taking mounjaro  a week ago due  to flank pain  She did feel queesy, as well as had constipation, she takes Metamucil No galactorrhea  Has tolerable hot flashes due to tamoxifen  use    HOME DIABETES REGIMEN:  Metformin  500 mg XR, 2 tabs BID  Farxiga  10 mg daily  Lantus  12 units daily  Mounjaro  2.5 mg weekly- not taking     Statin: yes ACE-I/ARB: no Prior Diabetic Education: no   GLUCOSE LOG:  n/a   DIABETIC COMPLICATIONS: Microvascular complications:   Denies: CKD, neuropathy, retinopathy Last Eye Exam: scheduled in 03/2023   Macrovascular complications:   Denies: CAD, CVA, PVD   HISTORY:  Past Medical History:  Past Medical History:  Diagnosis Date   Arthritis    Breast cancer (HCC)    Cervical herniated disc    Complication of anesthesia    hypotension  in april after hysterectomy   History of radiation therapy    Right breast- 09/26/23-10/21/23- Dr. Lynwood Nasuti   Hypertension    not taking meds    Pituitary microadenoma with hyperprolactinemia (HCC)    Psoriasis both elbows, scalp, left thigh, saw dr ivin, last week given cream for areas healing   Sleep apnea    never had study done, but things she does have it   Past Surgical History:  Past Surgical History:  Procedure Laterality Date   ABDOMINAL HYSTERECTOMY     APPENDECTOMY     BREAST BIOPSY Right  10/11/2014   BREAST BIOPSY Right 07/26/2023   MM RT BREAST BX W LOC DEV 1ST LESION IMAGE BX SPEC STEREO GUIDE 07/26/2023 [facility]   BREAST BIOPSY  08/10/2023   MM RT RADIOACTIVE SEED LOC MAMMO GUIDE 08/10/2023 [facility]   BREAST EXCISIONAL BIOPSY Right 07/2015   BREAST EXCISIONAL BIOPSY Right    BREAST LUMPECTOMY WITH RADIOACTIVE SEED LOCALIZATION Right 08/07/2015   Procedure: RIGHT BREAST LUMPECTOMY WITH RADIOACTIVE SEED LOCALIZATION AND EXCISION OF BREAST MASS X2;  Surgeon: Vicenta Poli, MD;  Location: [facility] OR;  Service: General;  Laterality: Right;   BREAST LUMPECTOMY WITH RADIOACTIVE SEED LOCALIZATION Right  08/11/2023   Procedure: RIGHT BREAST SEED LUMPECTOMY;  Surgeon: Vanderbilt Ned, MD;  Location: [facility] OR;  Service: General;  Laterality: Right;   BREAST SURGERY     DILATION AND CURETTAGE OF UTERUS     FOOT SURGERY     left heel     HERNIA REPAIR     umbilical   MAXIMUM ACCESS (MAS)POSTERIOR LUMBAR INTERBODY FUSION (PLIF) 1 LEVEL N/A 09/09/2016   Procedure: MAXIMUM ACCESS SURGERY POSTERIOR LUMBAR INTERBODY FUSION LUMBAR THREE-FOUR;  Surgeon: Alm GORMAN Molt, MD;  Location: [facility] OR;  Service: Neurosurgery;  Laterality: N/A;   MINI-LAPAROTOMY W/ TUBAL LIGATION     ROBOTIC ASSISTED TOTAL HYSTERECTOMY WITH BILATERAL SALPINGO OOPHERECTOMY Bilateral 12/03/2014   Procedure: ROBOTIC ASSISTED TOTAL HYSTERECTOMY WITH BILATERAL SALPINGO OOPHORECTOMY;  Surgeon: Maurilio Ship, MD;  Location: [facility] OR[facility];  Service: Gynecology;  Laterality: Bilateral;   TOTAL HIP ARTHROPLASTY Right 10/18/2017   Procedure: RIGHT TOTAL HIP ARTHROPLASTY ANTERIOR APPROACH;  Surgeon: Ernie Cough, MD;  Location: [facility];  Service: Orthopedics;  Laterality: Right;  70 mins   TUBAL LIGATION     Social History:  reports that she quit smoking about 45 years ago. Her smoking use included cigarettes. She has never used smokeless tobacco. She reports current alcohol use of about 2.0 standard drinks of alcohol per week. She reports that she does not use drugs. Family History:  Family History  Problem Relation Age of Onset   Cancer Father        skin   Melanoma Father 50       arm   Hypertension Father    Prostate cancer Father 63       mets in 63s   Cancer Paternal Aunt        breast   Breast cancer Paternal Aunt 4   Lung cancer Maternal Grandfather 98   Cancer Paternal Grandfather        skin   Melanoma Paternal Grandfather 65       mets   Other Daughter 2       optic glioma   Cancer Cousin        prostate   Prostate cancer Cousin 18     HOME MEDICATIONS: Allergies as of 05/01/2024       Reactions   Latex Dermatitis   Nickel  Dermatitis   Causes irritation    Other Dermatitis   Steri Strips    Silicone Dermatitis        Medication List        Accurate as of May 01, 2024 12:07 PM. If you have any questions, ask your nurse or doctor.          STOP taking these medications    tirzepatide  2.5 MG/0.5ML Pen Commonly known as: MOUNJARO  Stopped by: Donell PARAS Caelynn Marshman       TAKE these medications  albuterol 108 (90 Base) MCG/ACT inhaler Commonly known as: VENTOLIN HFA SMARTSIG:2 Puff(s) By Mouth Every 4 Hours PRN   Clenpiq 10-3.5-12 MG-GM -GM/175ML Soln Generic drug: Sod Picosulfate-Mag Ox-Cit Acd 175 ML ORALLY TWICE for 2   clobetasol 0.05 % external solution Commonly known as: TEMOVATE SMARTSIG:sparingly Twice Daily PRN   cyclobenzaprine  10 MG tablet Commonly known as: FLEXERIL  Take 1 tablet (10 mg total) by mouth 2 (two) times daily as needed for muscle spasms.   dapagliflozin  propanediol 10 MG Tabs tablet Commonly known as: Farxiga  Take 1 tablet (10 mg total) by mouth daily before breakfast.   diclofenac 75 MG EC tablet Commonly known as: VOLTAREN Take 75 mg by mouth daily as needed for moderate pain (pain score 4-6).   Embecta Pen Needle Nano 2 Gen 32G X 4 MM Misc Generic drug: Insulin  Pen Needle USE AS DIRECTED   hydrochlorothiazide  25 MG tablet Commonly known as: HYDRODIURIL  Take 1 tablet (25 mg total) by mouth daily.   Lantus  SoloStar 100 UNIT/ML Solostar Pen Generic drug: insulin  glargine Inject 10 Units into the skin daily.   latanoprost 0.005 % ophthalmic solution Commonly known as: XALATAN Place 1 drop into both eyes at bedtime.   metFORMIN  500 MG 24 hr tablet Commonly known as: GLUCOPHAGE -XR TAKE 2 TABLETS (1,000 MG TOTAL) BY MOUTH IN THE MORNING AND AT BEDTIME.   OneTouch Verio test strip Generic drug: glucose blood Use as instructed   rosuvastatin  20 MG tablet Commonly known as: CRESTOR  Take 1 tablet (20 mg total) by mouth daily.   tamoxifen   10 MG tablet Commonly known as: NOLVADEX  Take 1 tablet (10 mg total) by mouth daily.   traMADol  50 MG tablet Commonly known as: ULTRAM  Take 1 tablet (50 mg total) by mouth every 6 (six) hours as needed for moderate pain (pain score 4-6) or severe pain (pain score 7-10).   valsartan  320 MG tablet Commonly known as: DIOVAN  Take 1 tablet (320 mg total) by mouth daily.         OBJECTIVE:   Vital Signs: BP 114/72 (BP Location: Left Arm, Patient Position: Sitting, Cuff Size: Large)   Pulse 68   Ht 5' 8.5 (1.74 m)   Wt 255 lb (115.7 kg)   SpO2 98%   BMI 38.21 kg/m   Wt Readings from Last 3 Encounters:  05/01/24 255 lb (115.7 kg)  04/23/24 250 lb (113.4 kg)  01/17/24 267 lb 9.6 oz (121.4 kg)     Exam: General: Pt appears well and is in NAD  Lungs: Clear with good BS bilat   Heart: RRR   Extremities: No pretibial edema.   Neuro: MS is good with appropriate affect, pt is alert and Ox3    DM foot exam: 05/01/2024     The skin of the feet is intact without sores or ulcerations. The pedal pulses are 2+ on right and 2+ on left. The sensation is intact to a screening 5.07, 10 gram monofilament bilaterally      DATA REVIEWED:  Lab Results  Component Value Date   HGBA1C 6.1 (A) 05/01/2024   HGBA1C 6.5 (A) 08/30/2023   HGBA1C 7.0 (A) 02/28/2023       Latest Reference Range & Units 05/01/24 11:43  Total CHOL/HDL Ratio <5.0 (calc) 2.2  Cholesterol <200 mg/dL 876  HDL Cholesterol > OR = 50 mg/dL 55  LDL Cholesterol (Calc) mg/dL (calc) 46  MICROALB/CREAT RATIO <30 mg/g creat 4  Non-HDL Cholesterol (Calc) <130 mg/dL (calc) 68  Triglycerides <150 mg/dL  132    Latest Reference Range & Units 05/01/24 11:43  Prolactin ng/mL 23.0  TSH 0.40 - 4.50 mIU/L 1.44  T4,Free(Direct) 0.8 - 1.8 ng/dL 1.3       Latest Reference Range & Units 04/23/24 15:21  Sodium 135 - 145 mmol/L 140  Potassium 3.5 - 5.1 mmol/L 3.3 (L)  Chloride 98 - 111 mmol/L 100  CO2 22 - 32 mmol/L 25   Glucose 70 - 99 mg/dL 874 (H)  BUN 8 - 23 mg/dL 19  Creatinine 9.55 - 8.99 mg/dL 9.01  Calcium  8.9 - 10.3 mg/dL 9.6  Anion gap 5 - 15  15  Alkaline Phosphatase 38 - 126 U/L 59  Albumin 3.5 - 5.0 g/dL 4.5  Lipase 11 - 51 U/L 19  AST 15 - 41 U/L 15  ALT 0 - 44 U/L 16  Total Protein 6.5 - 8.1 g/dL 7.1  Total Bilirubin 0.0 - 1.2 mg/dL 0.7  GFR, Estimated >39 mL/min >60        BRAIN MRI 03/23/2005   1. Suspect 5 mm prolactinoma in the right inferior aspect of the gland with mild remodeling of the sellar floor as well as mild deviation of the pituitary stalk from right to left.  2. Otherwise negative cranial MRI  ASSESSMENT / PLAN / RECOMMENDATIONS:   1) Type 2 Diabetes Mellitus, optimally controlled, Without complications - Most recent A1c of 6.1 %. Goal A1c < 7.0 %.    -A1c at goal -She could not tolerate Ozempic  due to bloating and severe constipation -She did well on Mounjaro  2.5 mg weekly, but had to discontinue prior to breast cancer surgery, we restarted again, but by her visit in September, 2025 she has held it for a week due to flank pain and constipation -She continues to have flank pain, it is not as severe as it used to be, I did explain to the patient I am not aware of Mounjaro  causing flank pain, but due to the fact that it was causing her to have constipation, we opted to stay off GLP-1 agonist at this time -No changes    MEDICATIONS: Continue metformin  500 mg 2 tablets BID Continue  Farxiga  10 mg, 1 tablet daily in the morning Continue Lantus  12 units daily Discontinue Mounjaro   EDUCATION / INSTRUCTIONS: BG monitoring instructions: Patient is instructed to check her blood sugars 1 times a day, Fasting Call [facility] if: BG persistently < 70  I reviewed the Rule of 15 for the treatment of hypoglycemia in detail with the patient. Literature supplied.   2) Diabetic complications:  Eye: Does not have known diabetic retinopathy.  Neuro/ Feet:  Does not have known diabetic peripheral neuropathy .  Renal: Patient does not have known baseline CKD. She   is not on an ACEI/ARB at present.    3) Dyslipidemia:    -She was started on Statin therapy in 12/2020 - Lipid panel is optimal, no change   Medication   Continue Rosuvastatin  20 mg daily     4) Hx pituitary microadenoma/hyperprolactinemia   - Patient is concerned about this - Asymptomatic - TFTs prolactin all within normal range  F/U in 6 months   Signed electronically by: Stefano Redgie Butts, MD  [facility] Endocrinology  [facility] [address] [place] [place], [place] [address] Phone: [phone] FAX: [phone]   CC: Lynwood Laneta LELON DEVONNA [address] [place] [address] Phone: [phone]  Fax: [phone]  Return to Endocrinology clinic as below:  Future Appointments  Date Time Provider Department Center  07/03/2024 10:20 AM [facility] 1 [facility]  07/24/2024 11:15 AM Odean Potts, MD [facility] None

## 2024-05-01 NOTE — Patient Instructions (Signed)
-   Continue Lantus  12 units daily  - Continue Metformin  500 mg TWO tablets Twice daily  - Continue  Farxiga  10 mg, 1 tablet daily in the morning     HOW TO TREAT LOW BLOOD SUGARS (Blood sugar LESS THAN 70 MG/DL) Please follow the RULE OF 15 for the treatment of hypoglycemia treatment (when your (blood sugars are less than 70 mg/dL)   STEP 1: Take 15 grams of carbohydrates when your blood sugar is low, which includes:  3-4 GLUCOSE TABS  OR 3-4 OZ OF JUICE OR REGULAR SODA OR ONE TUBE OF GLUCOSE GEL    STEP 2: RECHECK blood sugar in 15 MINUTES STEP 3: If your blood sugar is still low at the 15 minute recheck --> then, go back to STEP 1 and treat AGAIN with another 15 grams of carbohydrates.

## 2024-05-02 ENCOUNTER — Ambulatory Visit: Payer: Self-pay | Admitting: Internal Medicine

## 2024-05-02 LAB — LIPID PANEL
Cholesterol: 123 mg/dL (ref <200)
HDL: 55 mg/dL (ref >=50)
LDL Cholesterol (Calc): 46 mg/dL
Non-HDL Cholesterol (Calc): 68 mg/dL (ref <130)
Total CHOL/HDL Ratio: 2.2 (calc) (ref <5.0)
Triglycerides: 132 mg/dL (ref <150)

## 2024-05-02 LAB — TSH: TSH: 1.44 m[IU]/L (ref 0.40–4.50)

## 2024-05-02 LAB — T4, FREE: Free T4: 1.3 ng/dL (ref 0.8–1.8)

## 2024-05-02 LAB — PROLACTIN: Prolactin: 23 ng/mL

## 2024-05-02 LAB — MICROALBUMIN / CREATININE URINE RATIO
Creatinine, Urine: 245 mg/dL (ref 20–275)
Microalb Creat Ratio: 4 mg/g{creat} (ref <30)
Microalb, Ur: 1.1 mg/dL

## 2024-05-02 MED ORDER — ROSUVASTATIN CALCIUM 20 MG PO TABS: 20.0000 mg | ORAL_TABLET | Freq: Every day | ORAL | 3 refills | Status: DC

## 2024-05-18 ENCOUNTER — Other Ambulatory Visit: Payer: Self-pay | Admitting: Internal Medicine

## 2024-05-18 DIAGNOSIS — E1165 Type 2 diabetes mellitus with hyperglycemia: Secondary | ICD-10-CM

## 2024-06-21 ENCOUNTER — Other Ambulatory Visit: Payer: Self-pay | Admitting: Cardiology

## 2024-06-21 ENCOUNTER — Other Ambulatory Visit: Payer: Self-pay | Admitting: Internal Medicine

## 2024-06-21 DIAGNOSIS — E1165 Type 2 diabetes mellitus with hyperglycemia: Secondary | ICD-10-CM

## 2024-06-22 MED ORDER — EMBECTA PEN NEEDLE NANO 2 GEN 32G X 4 MM MISC: 2 refills | Status: AC

## 2024-06-22 NOTE — Addendum Note (Signed)
 Addended by: CLEOTILDE ROLIN RAMAN on: 06/22/2024 09:18 AM   Modules accepted: Orders

## 2024-07-02 ENCOUNTER — Ambulatory Visit: Admitting: Internal Medicine

## 2024-07-03 ENCOUNTER — Ambulatory Visit
Admission: RE | Admit: 2024-07-03 | Discharge: 2024-07-03 | Disposition: A | Source: Ambulatory Visit | Attending: Adult Health | Admitting: Adult Health

## 2024-07-03 DIAGNOSIS — D0511 Intraductal carcinoma in situ of right breast: Secondary | ICD-10-CM

## 2024-07-23 NOTE — Assessment & Plan Note (Signed)
 08/11/2023:Right lumpectomy: Grade 2 DCIS, 4 mm, margins negative, ER 100%, PR 100%  Adjuvant radiation therapy will be completed 10/21/2023. Treatment plan: Adjuvant antiestrogen therapy with tamoxifen  10 mg daily x 5 years   Tamoxifen  toxicities:  Breast cancer surveillance: Breast exam 07/24/2024: Benign Mammogram 07/03/2024: Benign breast density category B  Return to clinic in 1 year for follow-up

## 2024-07-24 ENCOUNTER — Inpatient Hospital Stay: Attending: Hematology and Oncology | Admitting: Hematology and Oncology

## 2024-07-24 VITALS — BP 124/88 | HR 78 | Temp 97.7°F | Resp 18 | Ht 68.5 in | Wt 259.5 lb

## 2024-07-24 DIAGNOSIS — Z79899 Other long term (current) drug therapy: Secondary | ICD-10-CM | POA: Diagnosis not present

## 2024-07-24 DIAGNOSIS — Z780 Asymptomatic menopausal state: Secondary | ICD-10-CM | POA: Diagnosis not present

## 2024-07-24 DIAGNOSIS — Z79810 Long term (current) use of selective estrogen receptor modulators (SERMs): Secondary | ICD-10-CM | POA: Diagnosis not present

## 2024-07-24 DIAGNOSIS — D0511 Intraductal carcinoma in situ of right breast: Secondary | ICD-10-CM | POA: Diagnosis not present

## 2024-07-24 DIAGNOSIS — Z923 Personal history of irradiation: Secondary | ICD-10-CM | POA: Insufficient documentation

## 2024-07-24 NOTE — Progress Notes (Signed)
 Patient Care Team: James, Natalie W, PA-C as PCP - General (Family Medicine) Hobart Powell BRAVO, MD (Inactive) as PCP - Cardiology (Cardiology) Vanderbilt Ned, MD as Consulting Physician (General Surgery) Odean Potts, MD as Consulting Physician (Hematology and Oncology) Shannon Agent, MD as Consulting Physician (Radiation Oncology)  DIAGNOSIS:  Encounter Diagnosis  Name Primary?   Ductal carcinoma in situ (DCIS) of right breast Yes    SUMMARY OF ONCOLOGIC HISTORY: Oncology History  Ductal carcinoma in situ (DCIS) of right breast  08/01/2023 Initial Diagnosis   Ductal carcinoma in situ (DCIS) of right breast   08/03/2023 Cancer Staging   Staging form: Breast, AJCC 8th Edition - Clinical stage from 08/03/2023: Stage 0 (cTis (DCIS), cN0, cM0, ER+, PR+) - Signed by Odean Potts, MD on 08/03/2023 Stage prefix: Initial diagnosis Nuclear grade: G2   08/11/2023 Surgery   Right lumpectomy: Grade 2 DCIS, 4 mm, margins negative, ER 100%, PR 100%   08/11/2023 Cancer Staging   Staging form: Breast, AJCC 8th Edition - Pathologic stage from 08/11/2023: Stage 0 (pTis (DCIS), pN0, cM0) - Signed by Crawford Morna Pickle, NP on 01/17/2024 Stage prefix: Initial diagnosis   08/13/2023 Genetic Testing   Negative [facility] CustomNext-Cancer +RNAinsight Panel.  Report date is 08/13/2023.   The CustomNext-Cancer+RNAinsight panel offered by [facility] includes sequencing, rearrangement, and RNA analysis for the following 56 genes: AIP, ALK, APC, ATM, BAP1, BARD1, BMPR1A, BRCA1, BRCA2, BRIP1, CDH1, CDK4, CDKN1B, CDKN2A, CHEK2, DICER1, FH, FLCN, MEN1, MET, MLH1, MSH2, MSH6, MUTYH, NF1, NF2, NTHL1, PALB2, PHOX2B, PMS2, POT1, PRKAR1A, PTCH1, PTEN, RAD51C, RAD51D, RB1, SMAD4, SMARCA4, SMARCB1, SMARCE1, STK11, SUFU, TP53, TSC1, TSC2 and VHL (sequencing and deletion/duplication); AXIN2, HOXB13, MBD4, MITF, MSH3, POLD1 and POLE (sequencing only); EPCAM and GREM1 (deletion/duplication only).   09/26/2023  - 10/21/2023 Radiation Therapy   Plan Name: Breast_R Site: Breast, Right Technique: 3D Mode: Photon Dose Per Fraction: 2.67 Gy Prescribed Dose (Delivered / Prescribed): 42.72 Gy / 42.72 Gy Prescribed Fxs (Delivered / Prescribed): 16 / 16   Plan Name: Breast_R_Bst Site: Breast, Right Technique: Electron Mode: Electron Dose Per Fraction: 2.5 Gy Prescribed Dose (Delivered / Prescribed): 7.5 Gy / 7.5 Gy Prescribed Fxs (Delivered / Prescribed): 3 / 3   Plan Name: Breast_R_Bst:1 Site: Breast, Right Technique: Electron Mode: Electron Dose Per Fraction: 2.5 Gy Prescribed Dose (Delivered / Prescribed): 2.5 Gy / 2.5 Gy Prescribed Fxs (Delivered / Prescribed): 1 / 1   10/2023 -  Anti-estrogen oral therapy   Tamoxifen  x 5 years     CHIEF COMPLIANT: F/U on Tamoxifen   HISTORY OF PRESENT ILLNESS: History of Present Illness Miranda Marquez is a 63 year old female with ductal carcinoma in situ (DCIS) who presents for a follow-up visit.  Miranda Marquez is taking tamoxifen  and has had three manageable hot flashes since starting it.  Miranda Marquez has diabetes and previously used Mounjaro , which caused severe abdominal pain and was stopped. Miranda Marquez lost 15 pounds on Ozempic  but regained the weight and does not plan to restart it. Miranda Marquez recently gained weight over Thanksgiving and is trying to lose again.  Miranda Marquez uses diclofenac for chronic back and hip pain. Miranda Marquez has had back surgery and bilateral hip replacements.  Miranda Marquez has been married for forty years and provides daily care for five grandchildren.     ALLERGIES:  is allergic to latex, nickel, other, and silicone.  MEDICATIONS:  Current Outpatient Medications  Medication Sig Dispense Refill   dapagliflozin  propanediol (FARXIGA ) 10 MG TABS tablet Take 1 tablet (10 mg total)  by mouth daily before breakfast. 90 tablet 3   diclofenac (VOLTAREN) 75 MG EC tablet Take 75 mg by mouth daily as needed for moderate pain (pain score 4-6).     glucose blood (ONETOUCH VERIO) test  strip Use as instructed 100 each 12   hydrochlorothiazide  (HYDRODIURIL ) 25 MG tablet Take 1 tablet (25 mg total) by mouth daily. 90 tablet 3   insulin  glargine (LANTUS  SOLOSTAR) 100 UNIT/ML Solostar Pen Inject 10 Units into the skin daily. 15 mL 3   Insulin  Pen Needle (EMBECTA PEN NEEDLE NANO 2 GEN) 32G X 4 MM MISC Use to inject insulin  daily. 100 each 2   latanoprost (XALATAN) 0.005 % ophthalmic solution Place 1 drop into both eyes at bedtime.     metFORMIN  (GLUCOPHAGE -XR) 500 MG 24 hr tablet TAKE 2 TABLETS (1,000 MG TOTAL) BY MOUTH IN THE MORNING AND AT BEDTIME. 360 tablet 1   rosuvastatin  (CRESTOR ) 20 MG tablet Take 1 tablet (20 mg total) by mouth daily. 90 tablet 3   Sod Picosulfate-Mag Ox-Cit Acd (CLENPIQ) 10-3.5-12 MG-GM -GM/175ML SOLN 175 ML ORALLY TWICE for 2     tamoxifen  (NOLVADEX ) 10 MG tablet Take 1 tablet (10 mg total) by mouth daily. 90 tablet 3   valsartan  (DIOVAN ) 320 MG tablet TAKE 1 TABLET BY MOUTH EVERY DAY 90 tablet 0   No current facility-administered medications for this visit.    PHYSICAL EXAMINATION: ECOG PERFORMANCE STATUS: 1 - Symptomatic but completely ambulatory  Vitals:   07/24/24 1125  BP: 124/88  Pulse: 78  Resp: 18  Temp: 97.7 F (36.5 C)  SpO2: 99%   Filed Weights   07/24/24 1125  Weight: 259 lb 8 oz (117.7 kg)    Physical Exam   (exam performed in the presence of a chaperone)  LABORATORY DATA:  I have reviewed the data as listed    Latest Ref Rng & Units 04/23/2024    3:21 PM 01/17/2024   10:37 AM 08/03/2023   12:26 PM  CMP  Glucose 70 - 99 mg/dL 874  871  886   BUN 8 - 23 mg/dL 19  23  31    Creatinine 0.44 - 1.00 mg/dL 9.01  9.12  9.00   Sodium 135 - 145 mmol/L 140  140  138   Potassium 3.5 - 5.1 mmol/L 3.3  3.5  3.6   Chloride 98 - 111 mmol/L 100  104  102   CO2 22 - 32 mmol/L 25  27  27    Calcium  8.9 - 10.3 mg/dL 9.6  9.1  9.7   Total Protein 6.5 - 8.1 g/dL 7.1  6.8  7.2   Total Bilirubin 0.0 - 1.2 mg/dL 0.7  0.6  0.7   Alkaline  Phos 38 - 126 U/L 59  47  62   AST 15 - 41 U/L 15  17  16    ALT 0 - 44 U/L 16  16  21      Lab Results  Component Value Date   WBC 7.3 04/23/2024   HGB 13.0 04/23/2024   HCT 38.4 04/23/2024   MCV 88.1 04/23/2024   PLT 210 04/23/2024   NEUTROABS 2.6 01/17/2024    ASSESSMENT & PLAN:  Ductal carcinoma in situ (DCIS) of right breast 08/11/2023:Right lumpectomy: Grade 2 DCIS, 4 mm, margins negative, ER 100%, PR 100% Adjuvant radiation therapy completed 10/21/2023. Treatment plan: Adjuvant antiestrogen therapy with tamoxifen  10 mg daily x 5 years started March 2025   Tamoxifen  toxicities: Mild occasional hot flashes but  otherwise tolerating it extremely well  Breast cancer surveillance: Breast exam 07/24/2024: Benign Mammogram 07/03/2024: Benign breast density category B  Return to clinic in 1 year for follow-up   No orders of the defined types were placed in this encounter.  The patient has a good understanding of the overall plan. Miranda Marquez agrees with it. Miranda Marquez will call with any problems that may develop before the next visit here.  I personally spent a total of 30 minutes in the care of the patient today including preparing to see the patient, getting/reviewing separately obtained history, performing a medically appropriate exam/evaluation, counseling and educating, placing orders, referring and communicating with other health care professionals, documenting clinical information in the EHR, independently interpreting results, communicating results, and coordinating care.   Viinay K Jimmye Wisnieski, MD 07/24/24

## 2024-09-21 ENCOUNTER — Other Ambulatory Visit: Payer: Self-pay | Admitting: Internal Medicine

## 2024-09-21 DIAGNOSIS — R7309 Other abnormal glucose: Secondary | ICD-10-CM

## 2024-09-21 DIAGNOSIS — E785 Hyperlipidemia, unspecified: Secondary | ICD-10-CM

## 2024-11-07 ENCOUNTER — Ambulatory Visit: Admitting: Internal Medicine

## 2025-07-24 ENCOUNTER — Inpatient Hospital Stay: Admitting: Hematology and Oncology
